# Patient Record
Sex: Male | Born: 1935 | Race: White | Hispanic: No | Marital: Married | Smoking: Former smoker
Health system: Southern US, Community
[De-identification: ages and names within clinical notes are randomized; demographics above are authoritative.]

## PROBLEM LIST (undated history)

## (undated) DIAGNOSIS — K219 Gastro-esophageal reflux disease without esophagitis: Secondary | ICD-10-CM

## (undated) DIAGNOSIS — N189 Chronic kidney disease, unspecified: Secondary | ICD-10-CM

## (undated) DIAGNOSIS — Z8673 Personal history of transient ischemic attack (TIA), and cerebral infarction without residual deficits: Secondary | ICD-10-CM

## (undated) DIAGNOSIS — I447 Left bundle-branch block, unspecified: Secondary | ICD-10-CM

## (undated) DIAGNOSIS — E785 Hyperlipidemia, unspecified: Secondary | ICD-10-CM

## (undated) DIAGNOSIS — I639 Cerebral infarction, unspecified: Secondary | ICD-10-CM

## (undated) DIAGNOSIS — Z8546 Personal history of malignant neoplasm of prostate: Secondary | ICD-10-CM

## (undated) DIAGNOSIS — R55 Syncope and collapse: Secondary | ICD-10-CM

## (undated) DIAGNOSIS — I2510 Atherosclerotic heart disease of native coronary artery without angina pectoris: Secondary | ICD-10-CM

## (undated) DIAGNOSIS — I10 Essential (primary) hypertension: Secondary | ICD-10-CM

## (undated) HISTORY — DX: Personal history of transient ischemic attack (TIA), and cerebral infarction without residual deficits: Z86.73

## (undated) HISTORY — DX: Left bundle-branch block, unspecified: I44.7

## (undated) HISTORY — DX: Cerebral infarction, unspecified: I63.9

## (undated) HISTORY — DX: Syncope and collapse: R55

## (undated) HISTORY — DX: Personal history of malignant neoplasm of prostate: Z85.46

## (undated) HISTORY — PX: TONSILLECTOMY: SUR1361

## (undated) HISTORY — DX: Chronic kidney disease, unspecified: N18.9

## (undated) HISTORY — DX: Gastro-esophageal reflux disease without esophagitis: K21.9

## (undated) HISTORY — DX: Hyperlipidemia, unspecified: E78.5

## (undated) HISTORY — DX: Atherosclerotic heart disease of native coronary artery without angina pectoris: I25.10

## (undated) HISTORY — DX: Essential (primary) hypertension: I10

---

## 2002-04-27 ENCOUNTER — Ambulatory Visit (HOSPITAL_COMMUNITY): Admission: RE | Admit: 2002-04-27 | Discharge: 2002-04-27 | Payer: Self-pay | Admitting: Gastroenterology

## 2003-04-11 ENCOUNTER — Ambulatory Visit: Admission: RE | Admit: 2003-04-11 | Discharge: 2003-06-10 | Payer: Self-pay | Admitting: Radiation Oncology

## 2003-10-04 ENCOUNTER — Emergency Department (HOSPITAL_COMMUNITY): Admission: EM | Admit: 2003-10-04 | Discharge: 2003-10-04 | Payer: Self-pay | Admitting: Emergency Medicine

## 2003-12-12 ENCOUNTER — Ambulatory Visit: Admission: RE | Admit: 2003-12-12 | Discharge: 2004-01-15 | Payer: Self-pay | Admitting: Radiation Oncology

## 2004-08-13 ENCOUNTER — Ambulatory Visit: Admission: RE | Admit: 2004-08-13 | Discharge: 2004-11-14 | Payer: Self-pay | Admitting: Radiation Oncology

## 2004-12-12 ENCOUNTER — Ambulatory Visit: Admission: RE | Admit: 2004-12-12 | Discharge: 2004-12-12 | Payer: Self-pay | Admitting: Radiation Oncology

## 2005-03-04 ENCOUNTER — Ambulatory Visit: Payer: Self-pay | Admitting: Family Medicine

## 2005-06-27 ENCOUNTER — Emergency Department: Payer: Self-pay | Admitting: Emergency Medicine

## 2005-06-27 ENCOUNTER — Other Ambulatory Visit: Payer: Self-pay

## 2006-05-13 ENCOUNTER — Ambulatory Visit: Payer: Self-pay | Admitting: Orthopedic Surgery

## 2006-08-28 ENCOUNTER — Ambulatory Visit (HOSPITAL_COMMUNITY): Admission: RE | Admit: 2006-08-28 | Discharge: 2006-08-28 | Payer: Self-pay | Admitting: *Deleted

## 2006-08-28 HISTORY — PX: CARDIAC CATHETERIZATION: SHX172

## 2007-04-29 ENCOUNTER — Ambulatory Visit (HOSPITAL_COMMUNITY): Admission: RE | Admit: 2007-04-29 | Discharge: 2007-04-29 | Payer: Self-pay | Admitting: *Deleted

## 2007-10-19 ENCOUNTER — Ambulatory Visit (HOSPITAL_COMMUNITY): Admission: RE | Admit: 2007-10-19 | Discharge: 2007-10-19 | Payer: Self-pay | Admitting: Family Medicine

## 2007-11-10 ENCOUNTER — Ambulatory Visit: Payer: Self-pay | Admitting: Family Medicine

## 2007-11-27 ENCOUNTER — Encounter: Admission: RE | Admit: 2007-11-27 | Discharge: 2007-11-27 | Payer: Self-pay | Admitting: Neurology

## 2008-05-24 ENCOUNTER — Encounter: Admission: RE | Admit: 2008-05-24 | Discharge: 2008-05-24 | Payer: Self-pay | Admitting: Neurology

## 2008-07-14 ENCOUNTER — Encounter: Admission: RE | Admit: 2008-07-14 | Discharge: 2008-07-14 | Payer: Self-pay | Admitting: Neurology

## 2008-08-15 ENCOUNTER — Ambulatory Visit: Payer: Self-pay | Admitting: Family Medicine

## 2008-08-22 ENCOUNTER — Encounter: Admission: RE | Admit: 2008-08-22 | Discharge: 2008-08-22 | Payer: Self-pay | Admitting: Gastroenterology

## 2008-09-07 ENCOUNTER — Ambulatory Visit: Payer: Self-pay | Admitting: Family Medicine

## 2008-10-10 ENCOUNTER — Ambulatory Visit: Payer: Self-pay | Admitting: Vascular Surgery

## 2008-10-10 ENCOUNTER — Encounter (INDEPENDENT_AMBULATORY_CARE_PROVIDER_SITE_OTHER): Payer: Self-pay | Admitting: Internal Medicine

## 2008-10-10 ENCOUNTER — Inpatient Hospital Stay (HOSPITAL_COMMUNITY): Admission: EM | Admit: 2008-10-10 | Discharge: 2008-10-13 | Payer: Self-pay | Admitting: Emergency Medicine

## 2008-10-10 HISTORY — PX: TRANSTHORACIC ECHOCARDIOGRAM: SHX275

## 2009-01-09 ENCOUNTER — Ambulatory Visit: Payer: Self-pay | Admitting: Family Medicine

## 2009-02-19 ENCOUNTER — Ambulatory Visit: Payer: Self-pay | Admitting: Unknown Physician Specialty

## 2009-03-11 ENCOUNTER — Ambulatory Visit: Payer: Self-pay | Admitting: Unknown Physician Specialty

## 2009-04-11 ENCOUNTER — Ambulatory Visit: Payer: Self-pay | Admitting: Unknown Physician Specialty

## 2009-04-26 ENCOUNTER — Ambulatory Visit: Payer: Self-pay | Admitting: Family Medicine

## 2010-03-26 ENCOUNTER — Ambulatory Visit: Payer: Self-pay | Admitting: Cardiology

## 2010-11-21 LAB — POCT CARDIAC MARKERS
CKMB, poc: <1 ng/mL — ABNORMAL LOW (ref 1.0–8.0)
Myoglobin, poc: 102 ng/mL (ref 12–200)
Myoglobin, poc: 73.4 ng/mL (ref 12–200)
Troponin i, poc: <0.05 ng/mL (ref 0.00–0.09)
Troponin i, poc: <0.05 ng/mL (ref 0.00–0.09)

## 2010-11-21 LAB — RAPID URINE DRUG SCREEN, HOSP PERFORMED
Amphetamines: NOT DETECTED
Barbiturates: NOT DETECTED
Opiates: NOT DETECTED
Tetrahydrocannabinol: NOT DETECTED

## 2010-11-21 LAB — COMPREHENSIVE METABOLIC PANEL
ALT: 107 U/L — ABNORMAL HIGH (ref 0–53)
ALT: 115 U/L — ABNORMAL HIGH (ref 0–53)
AST: 116 U/L — ABNORMAL HIGH (ref 0–37)
AST: 23 U/L (ref 0–37)
Alkaline Phosphatase: 49 U/L (ref 39–117)
Alkaline Phosphatase: 54 U/L (ref 39–117)
BUN: 24 mg/dL — ABNORMAL HIGH (ref 6–23)
CO2: 21 mEq/L (ref 19–32)
CO2: 26 mEq/L (ref 19–32)
Calcium: 7.4 mg/dL — ABNORMAL LOW (ref 8.4–10.5)
Calcium: 8.3 mg/dL — ABNORMAL LOW (ref 8.4–10.5)
Chloride: 111 mEq/L (ref 96–112)
Chloride: 114 mEq/L — ABNORMAL HIGH (ref 96–112)
GFR calc Af Amer: >60 mL/min (ref >60)
GFR calc non Af Amer: 54 mL/min — ABNORMAL LOW (ref >60)
Glucose, Bld: 78 mg/dL (ref 70–99)
Potassium: 4.1 mEq/L (ref 3.5–5.1)
Potassium: 4.2 mEq/L (ref 3.5–5.1)
Sodium: 137 mEq/L (ref 135–145)
Sodium: 138 mEq/L (ref 135–145)
Total Bilirubin: 0.7 mg/dL (ref 0.3–1.2)
Total Protein: 4.8 g/dL — ABNORMAL LOW (ref 6.0–8.3)
Total Protein: 4.9 g/dL — ABNORMAL LOW (ref 6.0–8.3)
Total Protein: 5.6 g/dL — ABNORMAL LOW (ref 6.0–8.3)

## 2010-11-21 LAB — CBC
Hemoglobin: 12.4 g/dL — ABNORMAL LOW (ref 13.0–17.0)
Hemoglobin: 12.8 g/dL — ABNORMAL LOW (ref 13.0–17.0)
Hemoglobin: 13.9 g/dL (ref 13.0–17.0)
MCHC: 34.5 g/dL (ref 30.0–36.0)
MCHC: 35 g/dL (ref 30.0–36.0)
MCHC: 35.1 g/dL (ref 30.0–36.0)
MCV: 97.9 fL (ref 78.0–100.0)
RBC: 3.57 MIL/uL — ABNORMAL LOW (ref 4.22–5.81)
RBC: 3.62 MIL/uL — ABNORMAL LOW (ref 4.22–5.81)
RBC: 3.74 MIL/uL — ABNORMAL LOW (ref 4.22–5.81)
RBC: 4.04 MIL/uL — ABNORMAL LOW (ref 4.22–5.81)
RDW: 12.9 % (ref 11.5–15.5)
RDW: 13.2 % (ref 11.5–15.5)
WBC: 5.1 10*3/uL (ref 4.0–10.5)
WBC: 9.8 10*3/uL (ref 4.0–10.5)

## 2010-11-21 LAB — URINALYSIS, ROUTINE W REFLEX MICROSCOPIC
Bilirubin Urine: NEGATIVE
Ketones, ur: NEGATIVE mg/dL
Leukocytes, UA: NEGATIVE
Protein, ur: NEGATIVE mg/dL

## 2010-11-21 LAB — HEMOGLOBIN A1C
Hgb A1c MFr Bld: 5.8 % (ref 4.6–6.1)
Mean Plasma Glucose: 120 mg/dL

## 2010-11-21 LAB — HEPATITIS PANEL, ACUTE
HCV Ab: NEGATIVE
Hep A IgM: NEGATIVE

## 2010-11-21 LAB — STOOL CULTURE

## 2010-11-21 LAB — GLUCOSE, CAPILLARY
Comment 1: 0
Glucose-Capillary: 128 mg/dL — ABNORMAL HIGH (ref 70–99)

## 2010-11-21 LAB — GIARDIA/CRYPTOSPORIDIUM SCREEN(EIA)
Cryptosporidium Screen (EIA): NEGATIVE
Giardia Screen - EIA: NEGATIVE

## 2010-11-21 LAB — CLOSTRIDIUM DIFFICILE EIA: C difficile Toxins A+B, EIA: NEGATIVE

## 2010-11-21 LAB — DIFFERENTIAL
Basophils Relative: 0 % (ref 0–1)
Monocytes Absolute: 0.3 10*3/uL (ref 0.1–1.0)
Monocytes Relative: 3 % (ref 3–12)

## 2010-11-21 LAB — HOMOCYSTEINE: Homocysteine: 7.4 umol/L (ref 4.0–15.4)

## 2010-11-21 LAB — POCT I-STAT, CHEM 8
Calcium, Ion: 1.13 mmol/L (ref 1.12–1.32)
Creatinine, Ser: 1.6 mg/dL — ABNORMAL HIGH (ref 0.4–1.5)

## 2010-11-21 LAB — CULTURE, BLOOD (ROUTINE X 2): Culture: NO GROWTH

## 2010-11-21 LAB — BASIC METABOLIC PANEL
CO2: 20 mEq/L (ref 19–32)
Chloride: 112 mEq/L (ref 96–112)
Creatinine, Ser: 1.16 mg/dL (ref 0.4–1.5)
GFR calc Af Amer: >60 mL/min (ref >60)

## 2010-11-21 LAB — CK TOTAL AND CKMB (NOT AT ARMC)
CK, MB: 0.9 ng/mL (ref 0.3–4.0)
Relative Index: INVALID (ref 0.0–2.5)

## 2010-11-21 LAB — FECAL LACTOFERRIN, QUANT: Fecal Lactoferrin: POSITIVE

## 2010-11-21 LAB — URINE MICROSCOPIC-ADD ON

## 2010-11-21 LAB — BRAIN NATRIURETIC PEPTIDE: Pro B Natriuretic peptide (BNP): 90 pg/mL (ref 0.0–100.0)

## 2010-11-21 LAB — TSH: TSH: 0.813 u[IU]/mL (ref 0.350–4.500)

## 2010-11-21 LAB — PROTIME-INR: INR: 1.1 (ref 0.00–1.49)

## 2010-12-24 NOTE — Discharge Summary (Signed)
NAME:  Keith Woods, Keith Woods NO.:  [id number]   MEDICAL RECORD NO.:  [id number]          PATIENT TYPE:  INP   LOCATION:  3038                         FACILITY:  [facility]   PHYSICIAN:  Peggye Pitt, M.D. DATE OF BIRTH:  Nov 27, 1925   DATE OF ADMISSION:  10/09/2008  DATE OF DISCHARGE:  10/13/2008                               DISCHARGE SUMMARY   DISCHARGE DIAGNOSES:  1. Syncopal event, believed to be secondary to stasis.  2. History of hypertension.  3. Transaminitis.  4. Diarrhea, resolved.  5. History of prostate cancer status post radiation therapy.   DISCHARGE MEDICATIONS:  1. Multivitamin 1 tablet daily.  2. Fish oil 1200 mg two times a day.  3. Nexium 40 mg daily.  4. Aggrenox 1 tablet twice daily.   He has been instructed to stop all of his blood pressure medications  until he is next seen by Dr. Reyes Ivan or by Dr. Sherrie Mustache.   DISPOSITION AND FOLLOW UP:  Keith Woods is discharged home in stable  condition.  Please note that he has had some transaminitis in the  hospital.  When he follows up with his primary care physician, he should  have a repeat CMET to monitor his liver function tests.  Please also  note that his blood pressure medications have been discontinued at this  time and the decision whether or not they should be restarted will be  undertaken by his primary care physician once he is seen outside of the  hospital.   CONSULTATIONS DURING THIS HOSPITALIZATION:  Rosine Abe, M.D. with  Cardiology.   IMAGES AND PROCEDURES PERFORMED DURING THIS HOSPITALIZATION:  1. A chest x-ray on October 10, 2008 that showed no active chest disease.  2. An abdominal ultrasound on October 12, 2008 that showed no hepatic      lesion, no acute or active process identified.  Normal appearance      of the gallbladder and bile ducts.  Bilateral renal cysts.   HISTORY AND PHYSICAL EXAMINATION:  For full details, please refer to  dictation by Dr. Cherylin Mylar on October 10, 2008,  but in brief Keith Woods  is a very pleasant 77 year old Caucasian man who presented with an  episode of syncope.  He has been having diarrhea for about 3 days prior  to this.  His wife was in the hospital.  He came to see his wife in the  hospital and while here experienced a syncopal event and was sent to the  emergency department because of this.   HOSPITAL COURSE:  1. Syncopal event.  He was found to be orthostatic upon admission, so      our belief at this time is that his syncope was secondary to the      effect of his blood pressure medications and the dehydration with      his acute viral gastroenteritis and diarrhea.  He was given      aggressive IV fluid repletion.  His diarrhea has subsided without      any use of antibiotics.  All of his blood pressure medications have  been held.  His blood pressure on day of discharge is 120/86      without any medications.  The need for further antihypertensive use      should be decided by his outpatient physician.  2. For his diarrhea, I believe to be secondary to acute viral      gastroenteritis given rapid resolution.  Completely resolved at the      time of discharge.  3. Transaminitis.  His liver function tests on day of discharge are as      follows:  Total bili 0.7, alk phos 54, AST 84, ALT 115.  Total      protein 4.8, albumin of 2.6.  He has had a hepatitis panel in the      hospital that is negative as well as a right upper quadrant      ultrasound that shows no evidence of liver or gallbladder disease.      At this time, I would recommend rechecking his CMET in 4-6 weeks to      monitor his liver function test.  4. Rest of chronic medical issues were not a problem this      hospitalization.   VITAL SIGNS ON DAY OF DISCHARGE:  Blood pressure 132/86, heart rate 93,  respirations 18, O2 sats 97% on room air with a temperature of 98.6.   LABORATORY DATA ON DAY OF DISCHARGE:  Sodium 136, potassium 4.1,  chloride 112, bicarb  20, BUN 11, creatinine 1.16 with a glucose of 80.  WBCs 4.5, hemoglobin 12.8 and a platelet count of 174.      Peggye Pitt, M.D.  Electronically Signed     EH/MEDQ  D:  10/13/2008  T:  10/14/2008  Job:  [id number]   cc:   Elmore Guise., M.D.  Mila Merry

## 2010-12-24 NOTE — H&P (Signed)
NAME:  Keith Woods, Keith Woods NO.:  [id number]   MEDICAL RECORD NO.:  [id number]          PATIENT TYPE:  INP   LOCATION:  2604                         FACILITY:  [facility]   PHYSICIAN:  Carlena Hurl, MDDATE OF BIRTH:  1925-12-25   DATE OF ADMISSION:  10/09/2008  DATE OF DISCHARGE:                              HISTORY & PHYSICAL   CHIEF COMPLAINT:  Passing out.   HISTORY OF PRESENT ILLNESS:  This is an 77 year old Caucasian gentleman  who has a past medical history significant for hypertension, history of  prostate cancer status post radiation therapy who came to the ER because  of 1 episode of syncope.  The history was given by the patient.  According to him, this patient was visiting his wife who was admitted  here.  Initially when the patient got up yesterday morning, had a  breakfast and after that he had some belly upset and had said that he  threw about 2 times and after that he was having some discomfort in the  belly, but he did come to the hospital to see his wife and here when he  came in to see his wife on the floor, he was still having some abdominal  discomfort.  He went to the restroom and after he had a bowel movement,  he came out and passed out.  The patient does not remember exactly as  how much time did he pass out, but he denied having any chest pain,  palpitation, trouble breathing, headache before the episode of passing  out.  The patient was totally normal after he woke up and he does not  remember anything about this episode.  The patient was having some  dizziness before the episode.  He states that his heart rate varies  sometimes, when he stands up he feels that his heart rate is going down.   PAST MEDICAL HISTORY:  Significant for hypertension and prostate cancer  status post radiation therapy.  The last one was 3 years ago and he has  some problem with his optic nerve.  The patient is not sure exactly what  type of optic nerve problem  for which he takes Aggrenox.   PAST SURGICAL HISTORY:  Nothing significant.   ALLERGIES:  No known drug allergies.   REVIEW OF SYSTEM:  Nothing significant.   FAMILY HISTORY:  Nothing significant.   The medications that the patient is on at home are Nexium, Aggrenox,  amlodipine, Micardis, Bystolic, fish oil, and multivitamins.   PHYSICAL EXAMINATION:  GENERAL:  This is an 77 year old, pleasant,  Caucasian gentleman who is lying comfortably on the bed without any  severe chest pain or severe shortness of breath.  VITAL SIGNS:  When he came into the ER, his blood pressure 108/63, later  dropped to 84/62 and the patient is positive for orthostats.  His lying  blood pressure was 125/98 with a heart rate of 89, on sitting his heart  rate went up to 92 and blood pressure dropped to 113/77 and on standing,  his blood pressure further dropped to 84/62 with heart rate going up  to  107.  HEENT:  Head is atraumatic, normocephalic.  Pupils, PERRLA.  Tympanic  membrane intact.  No discharge from eyes or ears.  LUNGS:  Clear to auscultation bilaterally.  CVS:  S1, S2 heard with regular rate and rhythm.  ABDOMEN:  Soft.  Bowel sounds present.  Nontender.  Nondistended.  EXTREMITIES:  No pedal edema noted.  Pulse palpable bilaterally and no  carotid bruit appreciated.  CNS:  Awake, alert, and oriented x3.  No focal neurological deficits  noted.   EKG showed bradycardia with nonspecific ST-T changes, but otherwise, no  significant blocks appreciated.   ASSESSMENT AND PLAN:  An 77 year old gentleman with a history of  hypertension and who has had 1 episode of passing out while in the  hospital.  1. Syncope.  At this time, the patient's orthostats is positive, so it      could be orthostatic hypotension causing the patient's syncopal      episode.  The patient has already been receiving IV fluids.  So      far, he has received 2 L of IV normal saline.  So, we will continue      giving him  normal saline and at the same time we will monitor his      heart rate.  The patient's heart rate has been varying and he has      been having PVCs on the monitor.  His last heart rate was around 39      with no sinus pauses.  So, we will continue to monitor the      patient's heart rate, if it further varies, we will get a      Cardiology involved to rule out any sick sinus syndrome.  2. History of hypertension.  Right now, the patient's blood pressures      have been varying and his orthostatics positive, so we will hold      his blood pressure medications for now and continue to monitor his      blood pressure.  3. History of prostate cancer status post radiation therapy,      clinically stable at this time.  4. Deep vein thrombosis prophylaxis, the patient will be placed on      Lovenox 40 mg subcu 1 time a day and gastrointestinal prophylaxis,      the patient will be placed on Protonix 40 mg p.o. 1 time a day.      Carlena Hurl, MD  Electronically Signed     JD/MEDQ  D:  10/10/2008  T:  10/10/2008  Job:  [id number]

## 2010-12-27 NOTE — Op Note (Signed)
[facility]  Patient:    Keith Woods, Keith Woods                       MRN: [id number] Proc. Date: 02/17/00 Attending:  Griffith Citron, M.D. CC:         Mila Merry, M.D.                           Operative Report  PROCEDURE PERFORMED:  Colonoscopy.  INDICATION:  77 year old white male who has not undergone prior colorectal neoplasia surveillance, presenting with straining, constipation and left lower quadrant abdominal pain.  PROCEDURE: After reviewing the nature of the procedure with the patient, including potential risks and complications, and after discussing alternative methods of diagnosis and treatment, informed consent was signed.  The patient was premedicated receiving IV sedation totalling Versed 6 mg, fentanyl 75 gm, anesthesia administered by the dosage prior to enduring the course of the procedure.  Using an Olympus pediatric PC-146 video colonoscope, the rectum was intubated after normal digital examination.  The prostate was normal, smooth and symmetric.  The scope was easily advanced around the entire length of the colon to the cecum which was identified by the appendiceal orifice and ileocecal valve. Preparation was excellent throughout, patient having taken Visicol prep.  The scope was totally withdrawn with careful inspection of the entire colon in a retrograde manner, including retroflex view of the rectal vault.  No abnormality was noted.  Specifically, there was no explanation for the patients constipation or straining, or left lower quadrant abdominal pain. No neoplasia, mucosal inflammation, diverticular disease or vascular lesion identified.  Retroflex view of the rectal vault did reveal internal hemorrhoids which did not appear to be inflamed.  The colon was decompressed, the scope withdrawn.  The patient tolerated the procedure without difficulty, being maintained on Datascope monitor and low-flow oxygen  throughout.  He returned to recovery in stable condition.  Time 1, technical 1, preparation 1, total score equals 3.  ASSESSMENT: 1.   Normal colonoscopy. 2.   Internal hemorrhoids. 3.   Functional constipation and/or left lower quadrant pain.  RECOMMENDATIONS: 1.   Continue Nulev p.r.n. 2.   Miralax, one tab full in a large glass of water nightly. 3.   Repeat colonoscopy 10 years or sooner for annual Hemeoccult-to be      followed by Dr. Mila Merry. DD:  02/17/00 TD:  02/17/00 Job: [id number]

## 2010-12-27 NOTE — Cardiovascular Report (Signed)
NAME:  Keith Woods, MUSSELL NO.:  [id number]   MEDICAL RECORD NO.:  [id number]          PATIENT TYPE:  OIB   LOCATION:  2899                         FACILITY:  [facility]   PHYSICIAN:  Elmore Guise., M.D.DATE OF BIRTH:  [birth year]   DATE OF PROCEDURE:  08/28/2006  DATE OF DISCHARGE:                            CARDIAC CATHETERIZATION   INDICATIONS FOR PROCEDURE:  Chest pain with abnormal stress test.   DESCRIPTION OF PROCEDURE:  The patient was brought to the cardiac cath  lab after appropriate informed consent.  He was prepped and draped in  sterile fashion.  Approximately 10 mL of 1% lidocaine was used for local  anesthesia.  A 6-French sheath was placed in the right femoral artery  without difficulty.  Coronary angiography, LV angiography and limited  right femoral angiography were then performed.  Angio-Seal closure  device was deployed without difficulty.  Approximately 60 cc of contrast  was used during procedure to limit contrast induced nephropathy.  The  patient was given bicarb drip as well as Mucomyst prior to the procedure  and his Micardis has been held for the last 48 hours.   FINDINGS:  1. Left Main:  Normal.  2. LAD:  Moderate-to-large sized vessel with mild luminal      irregularities.  3. D1:  Small to moderate size with mild luminal irregularities.  4. Left circumflex:  Codominant with mild luminal irregularities.  5. OM-1/OM-2:  Moderate-sized vessel with mild luminal irregularities.  6. Ramus intermedius:  Moderate sized with mild luminal      irregularities.  7. RCA:  Small to moderate size, codominant with mild luminal      irregularities.  8. LV:  EF of 60%.  No wall motion abnormalities.  LVEDP is 6 mmHg.  9. Limited right femoral angiogram was performed showing no      significant disease and appropriate arteriotomy site for Angio-Seal      closure device deployment.  AngioSeal closure device was deployed      without difficulty.   IMPRESSION:  1. Nonobstructive coronary arteries.  2. Preserved LV systolic function with an EF of 60%.   PLAN:  1. At this time, I would recommend aggressive risk factor modification      as indicated.  Because of his baseline renal insufficiency      (creatinine 1.8), the patient is to hold his Micardis for another      48 hours and then restart at      his prior dose.  We will finish off his bicarb drip today and he      will finish his treatment with Mucomyst.  He is to call should he      have any further problems.  Routine post cath procedure      restrictions are to be done.  He will follow up in the office in 1      week.      Elmore Guise., M.D.  Electronically Signed     TWK/MEDQ  D:  08/28/2006  T:  08/28/2006  Job:  [id number]   cc:  Mila Merry

## 2011-04-08 ENCOUNTER — Encounter: Payer: Self-pay | Admitting: Cardiology

## 2011-04-23 ENCOUNTER — Ambulatory Visit (INDEPENDENT_AMBULATORY_CARE_PROVIDER_SITE_OTHER): Payer: Medicare Other | Admitting: Cardiology

## 2011-04-23 ENCOUNTER — Encounter: Payer: Self-pay | Admitting: Cardiology

## 2011-04-23 VITALS — BP 112/60 | HR 61 | Ht 74.0 in | Wt 180.2 lb

## 2011-04-23 DIAGNOSIS — I10 Essential (primary) hypertension: Secondary | ICD-10-CM

## 2011-04-23 DIAGNOSIS — I2510 Atherosclerotic heart disease of native coronary artery without angina pectoris: Secondary | ICD-10-CM

## 2011-04-23 DIAGNOSIS — R0989 Other specified symptoms and signs involving the circulatory and respiratory systems: Secondary | ICD-10-CM | POA: Insufficient documentation

## 2011-04-23 DIAGNOSIS — E785 Hyperlipidemia, unspecified: Secondary | ICD-10-CM

## 2011-04-23 MED ORDER — NITROGLYCERIN 0.4 MG SL SUBL: 0.4000 mg | SUBLINGUAL_TABLET | SUBLINGUAL | Status: DC | PRN

## 2011-04-23 NOTE — Assessment & Plan Note (Signed)
Surprisingly good response to WelChol. He is doing much better with his diet as well.

## 2011-04-23 NOTE — Progress Notes (Signed)
Verbon Frye Date of Birth: 1925-08-26   History of Present Illness: Mr. Sortor is seen for yearly followup. He has done very well from a cardiac standpoint. He denies any chest pain or shortness of breath. He has had no edema or palpitations. He was started on WelChol for his hypercholesterolemia and noted a 100 point drop in his cholesterol level. He has a history of statin intolerance. 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  Current Outpatient Prescriptions on File Prior to Visit  Medication Sig Dispense Refill  . amLODipine (NORVASC) 2.5 MG tablet Take 2.5 mg by mouth as needed.        . citalopram (CELEXA) 10 MG tablet Take 5 mg by mouth 2 (two) times daily.        . colesevelam (WELCHOL) 625 MG tablet Take 1,875 mg by mouth daily. 3 pills in the morning and 3 pills at night       . dipyridamole-aspirin (AGGRENOX) 25-200 MG per 12 hr capsule Take 1 capsule by mouth 2 (two) times daily.        Marland Kitchen esomeprazole (NEXIUM) 40 MG capsule Take 40 mg by mouth every other day.       . fish oil-omega-3 fatty acids 1000 MG capsule Take 1,200 mg by mouth 2 (two) times daily.        . Multiple Vitamin (MULTIVITAMIN) tablet Take 1 tablet by mouth daily.          Allergies  Allergen Reactions  . Lipitor (Atorvastatin Calcium)   . Nsaids   . Statins     Past Medical History  Diagnosis Date  . Syncope and collapse   . Hypertension   . Coronary artery disease     NONOBSTRUCTIVE  . History of transient ischemic attack (TIA)   . GERD (gastroesophageal reflux disease)   . History of prostate cancer     Past Surgical History  Procedure Date  . Cardiac catheterization 08/28/2006    EF 60%  . Tonsillectomy   . Transthoracic echocardiogram 10/10/2008    EF 55%    History  Smoking status  . Never Smoker   Smokeless tobacco  . Not on file    History  Alcohol Use No    Family History  Problem Relation Age of Onset  . Ovarian cancer Mother   . Prostate cancer  Father     Review of Systems: The review of systems is positive for a persistent cough. He just completed a course of antibiotics and feels that it is better. He has transient lightheadedness when he stands. He does note a little more balance problems which she attributes to age. All other systems were reviewed and are negative.  Physical Exam: BP 112/60  Pulse 61  Ht 6\' 2"  (1.88 m)  Wt 180 lb 3.2 oz (81.738 kg)  BMI 23.14 kg/m2 He is an elderly white male in no acute distress. He appears younger than his stated age.The patient is alert and oriented x 3.  The mood and affect are normal.  The skin is warm and dry.  Color is normal.  The HEENT exam reveals that the sclera are nonicteric.  The mucous membranes are moist.  The carotids are 2+ without bruits.  There is no thyromegaly.  There is no JVD.  The lungs are clear.  The chest wall is non tender.  The heart exam reveals a regular rate with a normal S1 and S2.  There are no murmurs, gallops, or  rubs.  The PMI is not displaced.   Abdominal exam reveals good bowel sounds.  There is no guarding or rebound.  There is no hepatosplenomegaly or tenderness.  There are no masses.  Exam of the legs reveal no clubbing, cyanosis, or edema.  The legs are without rashes.  The distal pulses are intact.  Cranial nerves II - XII are intact.  Motor and sensory functions are intact.  The gait is normal.  LABORATORY DATA:   Assessment / Plan:

## 2011-04-23 NOTE — Assessment & Plan Note (Signed)
Blood pressure is under excellent control. 

## 2011-04-23 NOTE — Assessment & Plan Note (Signed)
He had nonobstructive coronary disease by cardiac catheterization in 2008. His ECG today is unchanged from one year ago showing left axis deviation and old anterior infarction. He remains asymptomatic. We renewed his prescription for nitroglycerin. Otherwise we will followup again in one year.

## 2011-04-23 NOTE — Patient Instructions (Signed)
Continue with your current therapy.  I will see you in 1 year.

## 2011-05-05 LAB — CREATININE, SERUM
Creatinine, Ser: 1.56 — ABNORMAL HIGH
GFR calc Af Amer: 52 — ABNORMAL LOW
GFR calc non Af Amer: 43 — ABNORMAL LOW

## 2011-06-11 ENCOUNTER — Ambulatory Visit: Payer: Medicare Other | Admitting: Urology

## 2011-12-01 DIAGNOSIS — C61 Malignant neoplasm of prostate: Secondary | ICD-10-CM | POA: Diagnosis not present

## 2011-12-25 DIAGNOSIS — Z23 Encounter for immunization: Secondary | ICD-10-CM | POA: Diagnosis not present

## 2011-12-25 DIAGNOSIS — I10 Essential (primary) hypertension: Secondary | ICD-10-CM | POA: Diagnosis not present

## 2011-12-25 DIAGNOSIS — R079 Chest pain, unspecified: Secondary | ICD-10-CM | POA: Diagnosis not present

## 2012-04-28 ENCOUNTER — Encounter: Payer: Self-pay | Admitting: Cardiology

## 2012-04-28 ENCOUNTER — Ambulatory Visit (INDEPENDENT_AMBULATORY_CARE_PROVIDER_SITE_OTHER): Payer: Medicare Other | Admitting: Cardiology

## 2012-04-28 VITALS — BP 128/74 | HR 60 | Ht 74.0 in | Wt 182.0 lb

## 2012-04-28 DIAGNOSIS — I10 Essential (primary) hypertension: Secondary | ICD-10-CM | POA: Diagnosis not present

## 2012-04-28 DIAGNOSIS — I447 Left bundle-branch block, unspecified: Secondary | ICD-10-CM

## 2012-04-28 DIAGNOSIS — I2510 Atherosclerotic heart disease of native coronary artery without angina pectoris: Secondary | ICD-10-CM | POA: Diagnosis not present

## 2012-04-28 DIAGNOSIS — E785 Hyperlipidemia, unspecified: Secondary | ICD-10-CM

## 2012-04-28 NOTE — Patient Instructions (Signed)
Continue your current therapy  You may want to consider changing to the powder form of Welchol.  I will see you again in one year.

## 2012-04-28 NOTE — Progress Notes (Signed)
Keith Woods Date of Birth: 05-31-1926   History of Present Illness: Keith Woods is seen for yearly followup. He continues to do very well from a cardiac standpoint. The only episode of chest pain he has had is about 2 months ago. He reports it is an ECG done blood tests that were negative. He had increased his Nexium from every other day to daily and his pain went away. He has had no recurrent episodes. This is felt to be more GI related. He denies any shortness of breath. He remains very active.  Current Outpatient Prescriptions on File Prior to Visit  Medication Sig Dispense Refill  . amLODipine (NORVASC) 2.5 MG tablet Take 2.5 mg by mouth daily.       . citalopram (CELEXA) 10 MG tablet Take 5 mg by mouth 2 (two) times daily.        . colesevelam (WELCHOL) 625 MG tablet Take 1,875 mg by mouth daily. 3 pills in the morning and 3 pills at night       . dipyridamole-aspirin (AGGRENOX) 25-200 MG per 12 hr capsule Take 1 capsule by mouth 2 (two) times daily.        Marland Kitchen esomeprazole (NEXIUM) 40 MG capsule Take 40 mg by mouth daily.       . fish oil-omega-3 fatty acids 1000 MG capsule Take 1,200 mg by mouth daily.       . Multiple Vitamin (MULTIVITAMIN) tablet Take 1 tablet by mouth daily.        . nitroGLYCERIN (NITROSTAT) 0.4 MG SL tablet Place 1 tablet (0.4 mg total) under the tongue every 5 (five) minutes as needed for chest pain.  100 tablet  3    Allergies  Allergen Reactions  . Lipitor (Atorvastatin Calcium)   . Nsaids   . Statins     Past Medical History  Diagnosis Date  . Syncope and collapse   . Hypertension   . Coronary artery disease     NONOBSTRUCTIVE by cath 2008  . History of transient ischemic attack (TIA)   . GERD (gastroesophageal reflux disease)   . History of prostate cancer   . Hyperlipidemia   . LBBB (left bundle branch block)     Past Surgical History  Procedure Date  . Cardiac catheterization 08/28/2006    EF 60%  . Tonsillectomy   . Transthoracic  echocardiogram 10/10/2008    EF 55%    History  Smoking status  . Never Smoker   Smokeless tobacco  . Not on file    History  Alcohol Use No    Family History  Problem Relation Age of Onset  . Ovarian cancer Mother   . Prostate cancer Father     Review of Systems: The review of systems is positive for  transient lightheadedness when he stands.  All other systems were reviewed and are negative.  Physical Exam: BP 128/74  Pulse 60  Ht 6\' 2"  (1.88 m)  Wt 182 lb (82.555 kg)  BMI 23.37 kg/m2 He is an elderly white male in no acute distress. He appears younger than his stated age.The patient is alert and oriented x 3.  The mood and affect are normal.  The skin is warm and dry.  Color is normal.  The HEENT exam reveals that the sclera are nonicteric.  The mucous membranes are moist.  The carotids are 2+ without bruits.  There is no thyromegaly.  There is no JVD.  The lungs are clear.  The chest wall is  non tender.  The heart exam reveals a regular rate with a normal S1 and S2.  There are no murmurs, gallops, or rubs.  The PMI is not displaced.   Abdominal exam reveals good bowel sounds.  There is no guarding or rebound.  There is no hepatosplenomegaly or tenderness.  There are no masses.  Exam of the legs reveal no clubbing, cyanosis, or edema.  The legs are without rashes.  The distal pulses are intact.  Cranial nerves II - XII are intact.  Motor and sensory functions are intact.  The gait is normal.  LABORATORY DATA: ECG today demonstrates normal sinus rhythm with left axis deviation and a left bundle branch block. Assessment / Plan: 1. Coronary disease, nonobstructive. Continue risk factor modification.  2. Hypertension, well controlled.  3. Hyperlipidemia. Intolerant of multiple medications but he is tolerating WelChol well. He doesn't like taking 6 pills so I told him to consider taking the powder formulation.

## 2012-05-15 DIAGNOSIS — Z23 Encounter for immunization: Secondary | ICD-10-CM | POA: Diagnosis not present

## 2012-05-15 DIAGNOSIS — R079 Chest pain, unspecified: Secondary | ICD-10-CM | POA: Diagnosis not present

## 2012-05-15 DIAGNOSIS — I10 Essential (primary) hypertension: Secondary | ICD-10-CM | POA: Diagnosis not present

## 2012-06-02 DIAGNOSIS — C61 Malignant neoplasm of prostate: Secondary | ICD-10-CM | POA: Diagnosis not present

## 2012-10-11 ENCOUNTER — Ambulatory Visit: Payer: Self-pay | Admitting: Family Medicine

## 2012-10-11 DIAGNOSIS — Z23 Encounter for immunization: Secondary | ICD-10-CM | POA: Diagnosis not present

## 2012-10-11 DIAGNOSIS — G608 Other hereditary and idiopathic neuropathies: Secondary | ICD-10-CM | POA: Diagnosis not present

## 2012-10-11 DIAGNOSIS — G589 Mononeuropathy, unspecified: Secondary | ICD-10-CM | POA: Diagnosis not present

## 2012-10-11 DIAGNOSIS — R059 Cough, unspecified: Secondary | ICD-10-CM | POA: Diagnosis not present

## 2012-10-11 DIAGNOSIS — R05 Cough: Secondary | ICD-10-CM | POA: Diagnosis not present

## 2012-12-06 DIAGNOSIS — C61 Malignant neoplasm of prostate: Secondary | ICD-10-CM | POA: Diagnosis not present

## 2012-12-06 DIAGNOSIS — Z8546 Personal history of malignant neoplasm of prostate: Secondary | ICD-10-CM | POA: Insufficient documentation

## 2012-12-22 ENCOUNTER — Encounter: Payer: Self-pay | Admitting: Nurse Practitioner

## 2012-12-22 DIAGNOSIS — R42 Dizziness and giddiness: Secondary | ICD-10-CM | POA: Diagnosis not present

## 2012-12-22 DIAGNOSIS — G608 Other hereditary and idiopathic neuropathies: Secondary | ICD-10-CM | POA: Diagnosis not present

## 2012-12-22 DIAGNOSIS — Z23 Encounter for immunization: Secondary | ICD-10-CM | POA: Diagnosis not present

## 2012-12-22 DIAGNOSIS — E559 Vitamin D deficiency, unspecified: Secondary | ICD-10-CM | POA: Diagnosis not present

## 2012-12-30 DIAGNOSIS — Z23 Encounter for immunization: Secondary | ICD-10-CM | POA: Diagnosis not present

## 2012-12-30 DIAGNOSIS — G608 Other hereditary and idiopathic neuropathies: Secondary | ICD-10-CM | POA: Diagnosis not present

## 2012-12-30 DIAGNOSIS — R42 Dizziness and giddiness: Secondary | ICD-10-CM | POA: Diagnosis not present

## 2013-01-04 ENCOUNTER — Encounter: Payer: Self-pay | Admitting: Nurse Practitioner

## 2013-01-04 ENCOUNTER — Ambulatory Visit (INDEPENDENT_AMBULATORY_CARE_PROVIDER_SITE_OTHER): Payer: Medicare Other | Admitting: Nurse Practitioner

## 2013-01-04 VITALS — BP 130/78 | HR 56 | Ht 74.0 in | Wt 184.2 lb

## 2013-01-04 DIAGNOSIS — I498 Other specified cardiac arrhythmias: Secondary | ICD-10-CM

## 2013-01-04 DIAGNOSIS — R42 Dizziness and giddiness: Secondary | ICD-10-CM | POA: Diagnosis not present

## 2013-01-04 DIAGNOSIS — R001 Bradycardia, unspecified: Secondary | ICD-10-CM

## 2013-01-04 NOTE — Patient Instructions (Addendum)
Stay on your current medicines but stop the amlodipine  Monitor your blood pressure at home  See Dr. [place] in one month  Call the [facility] office at [phone] if you have any questions, problems or concerns.

## 2013-01-04 NOTE — Progress Notes (Signed)
Keith Woods Date of Birth: 10-25-1925 Medical Record #[id number]  History of Present Illness: [name] is seen back today for a work in visit. Seen for Dr. [place]. Has nonobstructive CAD per cath back in 2008, HLD, GERD, HTN and prostate cancer.   He has had some dizziness earlier this month. Seemed to be with going from a seated to standing position. Had checked his pulse and noted HR down to 30. Saw his PCP.  Had a 24 hour Holter with his PCP. Shows minimum rate of 46, maximum HR 90, average HR was 61; no pauses. Had PVC's noted - 2044 but no runs or significant arrhythmia noted. He has a chronic LBBB.   Comes in today. He is here with his wife. He notes that he will have some dizzy spells - 4 to 6 per day. Feels like inside his head is spinning. No real palpitations. Not passing out. In talking with him, it seems most of his spells are with position changes (prone to standing). Not with moving his head. Does not check his BP at home. He is not having chest pain. Not short of breath. Tries to restrict his salt. He did have labs checked recently by his PCP.  Current Outpatient Prescriptions on File Prior to Visit  Medication Sig Dispense Refill  . amLODipine (NORVASC) 2.5 MG tablet Take 2.5 mg by mouth daily.       . citalopram (CELEXA) 10 MG tablet Take 5 mg by mouth 2 (two) times daily.        . colesevelam (WELCHOL) 625 MG tablet Take 1,875 mg by mouth daily. 3 pills in the morning and 3 pills at night       . dipyridamole-aspirin (AGGRENOX) 25-200 MG per 12 hr capsule Take 1 capsule by mouth 2 (two) times daily.        Marland Kitchen esomeprazole (NEXIUM) 40 MG capsule Take 40 mg by mouth daily.       . fish oil-omega-3 fatty acids 1000 MG capsule Take 1,200 mg by mouth daily.       . Multiple Vitamin (MULTIVITAMIN) tablet Take 1 tablet by mouth daily.        . nitroGLYCERIN (NITROSTAT) 0.4 MG SL tablet Place 1 tablet (0.4 mg total) under the tongue every 5 (five) minutes as needed for chest pain.   100 tablet  3   No current facility-administered medications on file prior to visit.    Allergies  Allergen Reactions  . Lipitor (Atorvastatin Calcium)   . Nsaids   . Statins     Past Medical History  Diagnosis Date  . Syncope and collapse   . Hypertension   . Coronary artery disease     NONOBSTRUCTIVE by cath 2008  . History of transient ischemic attack (TIA)   . GERD (gastroesophageal reflux disease)   . History of prostate cancer   . Hyperlipidemia   . LBBB (left bundle branch block)     Past Surgical History  Procedure Laterality Date  . Cardiac catheterization  08/28/2006    EF 60%  . Tonsillectomy    . Transthoracic echocardiogram  10/10/2008    EF 55%    History  Smoking status  . Never Smoker   Smokeless tobacco  . Not on file    History  Alcohol Use No    Family History  Problem Relation Age of Onset  . Ovarian cancer Mother   . Prostate cancer Father     Review of Systems: The review  of systems is per the HPI.  All other systems were reviewed and are negative.  Physical Exam: BP 130/78  Pulse 56  Ht 6\' 2"  (1.88 m)  Wt 184 lb 3.2 oz (83.553 kg)  BMI 23.64 kg/m2 Sitting BP is 140/80 and standing is 110/70.  Patient is very pleasant and in no acute distress. Looks younger than his stated age. Skin is warm and dry. Color is normal.  HEENT is unremarkable. Normocephalic/atraumatic. PERRL. Sclera are nonicteric. Neck is supple. No masses. No JVD. Lungs are clear. Cardiac exam shows a regular rate and rhythm. Abdomen is soft. Extremities are without edema. Gait and ROM are intact. No gross neurologic deficits noted.  LABORATORY DATA: Holter is reviewed with Dr. [place].     Chemistry      Component Value Date/Time   NA 136 10/13/2008 0540   K 4.1 HEMOLYZED SPECIMEN, RESULTS MAY BE AFFECTED 10/13/2008 0540   CL 112 10/13/2008 0540   CO2 20 10/13/2008 0540   BUN 11 10/13/2008 0540   CREATININE 1.16 10/13/2008 0540      Component Value Date/Time   CALCIUM  8.3* 10/13/2008 0540   ALKPHOS 54 10/12/2008 0530   AST 84* 10/12/2008 0530   ALT 115* 10/12/2008 0530   BILITOT 0.7 10/12/2008 0530     Lab Results  Component Value Date   WBC 4.5 10/13/2008   HGB 12.8* 10/13/2008   HCT 36.6* 10/13/2008   MCV 97.9 10/13/2008   PLT 174 10/13/2008   No results found for this basename: CHOL,  HDL,  LDLCALC,  LDLDIRECT,  TRIG,  CHOLHDL    Assessment / Plan: 1. Dizziness - sounds more orthostatic to me. He is orthostatic here in the office today. I have stopped his Norvasc. I think this should be our first line of management. May need to liberalize salt as well.   2. Chronic LBBB  3. Resting bradycardia - not on any rate slowing medicines. No pauses noted. Lowest rate of 46 noted on the Holter.   4. PVC's - would not be able to start beta blocker in light of his bradycardia.   We will try this over the next month. See him back in a month.   Patient is agreeable to this plan and will call if any problems develop in the interim.   Keith Macadamia, RN, ANP-C [facility] [address] [place], [place]  [address]

## 2013-01-10 ENCOUNTER — Telehealth: Payer: Self-pay

## 2013-01-10 NOTE — Telephone Encounter (Signed)
Received message from Brien Mates CMA patient needs appointment with Dr.Jordan in 1 month.Patient called appointment scheduled with Dr.Jordan 02/09/13 at 11:45 am.

## 2013-02-09 ENCOUNTER — Ambulatory Visit (INDEPENDENT_AMBULATORY_CARE_PROVIDER_SITE_OTHER): Payer: Medicare Other | Admitting: Cardiology

## 2013-02-09 ENCOUNTER — Encounter: Payer: Self-pay | Admitting: Cardiology

## 2013-02-09 VITALS — BP 144/80 | HR 59 | Ht 74.0 in | Wt 186.0 lb

## 2013-02-09 DIAGNOSIS — I10 Essential (primary) hypertension: Secondary | ICD-10-CM | POA: Diagnosis not present

## 2013-02-09 DIAGNOSIS — I2510 Atherosclerotic heart disease of native coronary artery without angina pectoris: Secondary | ICD-10-CM

## 2013-02-09 DIAGNOSIS — I447 Left bundle-branch block, unspecified: Secondary | ICD-10-CM | POA: Diagnosis not present

## 2013-02-09 DIAGNOSIS — E785 Hyperlipidemia, unspecified: Secondary | ICD-10-CM | POA: Diagnosis not present

## 2013-02-09 NOTE — Progress Notes (Signed)
Keith Woods Date of Birth: 1926-06-04 Medical Record #[id number]  History of Present Illness: Mr. Bostwick is seen for a followup visit. Has nonobstructive CAD per cath back in 2008, HLD, GERD, HTN and prostate cancer. When seen in May he was experiencing symptoms of dizziness. Ultram monitor showed some sinus bradycardia but no significant pauses. He had occasional PVCs. His symptoms were orthostatic in nature. His amlodipine was discontinued. Since then his dizziness has resolved. He does complain of a lack of energy. His blood pressure readings at home have been variable. Frequently has normal blood pressure readings of 120/72 but may get a reading as high as 173 systolic at times.   Current Outpatient Prescriptions on File Prior to Visit  Medication Sig Dispense Refill  . citalopram (CELEXA) 10 MG tablet Take 5 mg by mouth 2 (two) times daily.        . colesevelam (WELCHOL) 625 MG tablet Take 1,875 mg by mouth daily. 3 pills in the morning and 3 pills at night       . dipyridamole-aspirin (AGGRENOX) 25-200 MG per 12 hr capsule Take 1 capsule by mouth 2 (two) times daily.        Marland Kitchen esomeprazole (NEXIUM) 40 MG capsule Take 40 mg by mouth daily.       . fish oil-omega-3 fatty acids 1000 MG capsule Take 1,200 mg by mouth daily.       . Multiple Vitamin (MULTIVITAMIN) tablet Take 1 tablet by mouth daily.        . nitroGLYCERIN (NITROSTAT) 0.4 MG SL tablet Place 1 tablet (0.4 mg total) under the tongue every 5 (five) minutes as needed for chest pain.  100 tablet  3  . Vitamin D, Ergocalciferol, (DRISDOL) 50000 UNITS CAPS Take 50,000 Units by mouth every 7 (seven) days.        No current facility-administered medications on file prior to visit.    Allergies  Allergen Reactions  . Lipitor (Atorvastatin Calcium)   . Nsaids   . Statins     Past Medical History  Diagnosis Date  . Syncope and collapse   . Hypertension   . Coronary artery disease     NONOBSTRUCTIVE by cath 2008  . History  of transient ischemic attack (TIA)   . GERD (gastroesophageal reflux disease)   . History of prostate cancer   . Hyperlipidemia   . LBBB (left bundle branch block)     Past Surgical History  Procedure Laterality Date  . Cardiac catheterization  08/28/2006    EF 60%  . Tonsillectomy    . Transthoracic echocardiogram  10/10/2008    EF 55%    History  Smoking status  . Never Smoker   Smokeless tobacco  . Not on file    History  Alcohol Use No    Family History  Problem Relation Age of Onset  . Ovarian cancer Mother   . Prostate cancer Father     Review of Systems: The review of systems is per the HPI.  All other systems were reviewed and are negative.  Physical Exam: BP 144/80  Pulse 59  Ht 6\' 2"  (1.88 m)  Wt 186 lb (84.369 kg)  BMI 23.87 kg/m2  SpO2 95% Patient is very pleasant and in no acute distress. Looks younger than his stated age. Skin is warm and dry. Color is normal.  HEENT is unremarkable. Normocephalic/atraumatic. PERRL. Sclera are nonicteric. Neck is supple. No masses. No JVD. Lungs are clear. Cardiac exam shows a regular rate  and rhythm. Abdomen is soft. Extremities are without edema. Gait and ROM are intact. No gross neurologic deficits noted.  LABORATORY DATA:    Chemistry      Component Value Date/Time   NA 136 10/13/2008 0540   K 4.1 HEMOLYZED SPECIMEN, RESULTS MAY BE AFFECTED 10/13/2008 0540   CL 112 10/13/2008 0540   CO2 20 10/13/2008 0540   BUN 11 10/13/2008 0540   CREATININE 1.16 10/13/2008 0540      Component Value Date/Time   CALCIUM 8.3* 10/13/2008 0540   ALKPHOS 54 10/12/2008 0530   AST 84* 10/12/2008 0530   ALT 115* 10/12/2008 0530   BILITOT 0.7 10/12/2008 0530     Lab Results  Component Value Date   WBC 4.5 10/13/2008   HGB 12.8* 10/13/2008   HCT 36.6* 10/13/2008   MCV 97.9 10/13/2008   PLT 174 10/13/2008   No results found for this basename: CHOL,  HDL,  LDLCALC,  LDLDIRECT,  TRIG,  CHOLHDL    Assessment / Plan: 1. Dizziness - by history this was  orthostatic in nature. His symptoms have resolved with cessation of amlodipine. He is currently on no antihypertensive therapy and I think his blood pressure readings are acceptable. I think that being overly aggressive in treating his elevated blood pressure will lead to intolerable side effects. We will continue to monitor for now.  2. Chronic LBBB  3. Resting bradycardia - not on any rate slowing medicines. No pauses noted. Lowest rate of 46 noted on the Holter.   4. PVC's asymptomatic

## 2013-02-09 NOTE — Patient Instructions (Signed)
Continue your current therapy  I will see you in 6 months.   

## 2013-03-10 ENCOUNTER — Ambulatory Visit: Payer: Medicare Other | Admitting: Cardiology

## 2013-04-26 DIAGNOSIS — R05 Cough: Secondary | ICD-10-CM | POA: Diagnosis not present

## 2013-04-26 DIAGNOSIS — Z23 Encounter for immunization: Secondary | ICD-10-CM | POA: Diagnosis not present

## 2013-04-26 DIAGNOSIS — G608 Other hereditary and idiopathic neuropathies: Secondary | ICD-10-CM | POA: Diagnosis not present

## 2013-04-28 ENCOUNTER — Ambulatory Visit: Payer: Self-pay | Admitting: Family Medicine

## 2013-04-28 DIAGNOSIS — R918 Other nonspecific abnormal finding of lung field: Secondary | ICD-10-CM | POA: Diagnosis not present

## 2013-04-28 DIAGNOSIS — R05 Cough: Secondary | ICD-10-CM | POA: Diagnosis not present

## 2013-04-28 DIAGNOSIS — R059 Cough, unspecified: Secondary | ICD-10-CM | POA: Diagnosis not present

## 2013-04-28 DIAGNOSIS — N289 Disorder of kidney and ureter, unspecified: Secondary | ICD-10-CM | POA: Diagnosis not present

## 2013-05-04 DIAGNOSIS — Z23 Encounter for immunization: Secondary | ICD-10-CM | POA: Diagnosis not present

## 2013-05-04 DIAGNOSIS — J984 Other disorders of lung: Secondary | ICD-10-CM | POA: Diagnosis not present

## 2013-05-04 DIAGNOSIS — G608 Other hereditary and idiopathic neuropathies: Secondary | ICD-10-CM | POA: Diagnosis not present

## 2013-05-06 DIAGNOSIS — H903 Sensorineural hearing loss, bilateral: Secondary | ICD-10-CM | POA: Diagnosis not present

## 2013-05-06 DIAGNOSIS — H6120 Impacted cerumen, unspecified ear: Secondary | ICD-10-CM | POA: Diagnosis not present

## 2013-07-04 DIAGNOSIS — G608 Other hereditary and idiopathic neuropathies: Secondary | ICD-10-CM | POA: Diagnosis not present

## 2013-07-04 DIAGNOSIS — S43499A Other sprain of unspecified shoulder joint, initial encounter: Secondary | ICD-10-CM | POA: Diagnosis not present

## 2013-07-04 DIAGNOSIS — Z23 Encounter for immunization: Secondary | ICD-10-CM | POA: Diagnosis not present

## 2013-07-20 DIAGNOSIS — H2510 Age-related nuclear cataract, unspecified eye: Secondary | ICD-10-CM | POA: Diagnosis not present

## 2013-08-15 ENCOUNTER — Ambulatory Visit (INDEPENDENT_AMBULATORY_CARE_PROVIDER_SITE_OTHER): Payer: Medicare Other | Admitting: Nurse Practitioner

## 2013-08-15 ENCOUNTER — Encounter: Payer: Self-pay | Admitting: Nurse Practitioner

## 2013-08-15 VITALS — BP 110/70 | HR 76 | Ht 74.0 in | Wt 183.4 lb

## 2013-08-15 DIAGNOSIS — I10 Essential (primary) hypertension: Secondary | ICD-10-CM | POA: Diagnosis not present

## 2013-08-15 NOTE — Patient Instructions (Signed)
Monitor your blood pressure at home - try to get your cuff checked with your PCP  See [name] in 6 months  Stay as active as you can  Call the [place] office at [phone] if you have any questions, problems or concerns.

## 2013-08-15 NOTE — Progress Notes (Signed)
Keith Woods Date of Birth: Jul 08, 1926 Medical Record #[id number]  History of Present Illness: Keith Woods is seen back today for a 6 month check. Seen for Keith Woods. He has nonobstructive CAD per cath back in 2008, HLD, GERD, HTN and prostatae cancer. He has had past issues with dizziness and has had bradycardia noted on Holter with PVCs - symptoms seemed to be orthostatic and his Norvasc was stopped. Past TIA and he is on chronic Aggrenox.   Last seen in July - seemed to be doing ok.   Comes back today. Here alone. Doing well. Has used his Norvasc on 3 separate occasions since his last visit here due to elevated BP - over 180. He has continued to have some dizziness - sounds positional - with turning over in the bed and going from lying to standing position. No chest pain. Not as active in the winter. More limited by back pain. Labs by primary care in the last 6 months. Tries to limit his salt. BP cuff quite old - has never changed the batteries - never checked in the office.   Current Outpatient Prescriptions  Medication Sig Dispense Refill  . amLODipine (NORVASC) 2.5 MG tablet Take 2.5 mg by mouth as needed.      . citalopram (CELEXA) 10 MG tablet Take 5 mg by mouth 2 (two) times daily.        . colesevelam (WELCHOL) 625 MG tablet Take 1,875 mg by mouth daily. 3 pills in the morning and 3 pills at night       . dipyridamole-aspirin (AGGRENOX) 25-200 MG per 12 hr capsule Take 1 capsule by mouth 2 (two) times daily.        Marland Kitchen esomeprazole (NEXIUM) 40 MG capsule Take 40 mg by mouth daily.       . fish oil-omega-3 fatty acids 1000 MG capsule Take 1,200 mg by mouth daily.       . Multiple Vitamin (MULTIVITAMIN) tablet Take 1 tablet by mouth daily.        . Vitamin D, Ergocalciferol, (DRISDOL) 50000 UNITS CAPS Take 50,000 Units by mouth every 7 (seven) days.       . nitroGLYCERIN (NITROSTAT) 0.4 MG SL tablet Place 1 tablet (0.4 mg total) under the tongue every 5 (five) minutes as needed for  chest pain.  100 tablet  3   No current facility-administered medications for this visit.    Allergies  Allergen Reactions  . Lipitor [Atorvastatin Calcium]   . Nsaids   . Statins     Past Medical History  Diagnosis Date  . Syncope and collapse   . Hypertension   . Coronary artery disease     NONOBSTRUCTIVE by cath 2008  . History of transient ischemic attack (TIA)   . GERD (gastroesophageal reflux disease)   . History of prostate cancer   . Hyperlipidemia   . LBBB (left bundle branch block)     Past Surgical History  Procedure Laterality Date  . Cardiac catheterization  08/28/2006    EF 60%  . Tonsillectomy    . Transthoracic echocardiogram  10/10/2008    EF 55%    History  Smoking status  . Never Smoker   Smokeless tobacco  . Not on file    History  Alcohol Use No    Family History  Problem Relation Age of Onset  . Ovarian cancer Mother   . Prostate cancer Father     Review of Systems: The review of systems is per  the HPI.  All other systems were reviewed and are negative.  Physical Exam: BP 110/70  Pulse 76  Ht 6\' 2"  (1.88 m)  Wt 183 lb 6.4 oz (83.19 kg)  BMI 23.54 kg/m2 Patient is very pleasant and in no acute distress. Looks younger than his stated age. Skin is warm and dry. Color is normal.  HEENT is unremarkable. Normocephalic/atraumatic. PERRL. Sclera are nonicteric. Neck is supple. No masses. No JVD. Lungs are clear. Cardiac exam shows a regular rate and rhythm. Abdomen is soft. Extremities are without edema. Gait and ROM are intact. No gross neurologic deficits noted.  LABORATORY DATA: N/A    Chemistry      Component Value Date/Time   NA 136 10/13/2008 0540   K 4.1 HEMOLYZED SPECIMEN, RESULTS MAY BE AFFECTED 10/13/2008 0540   CL 112 10/13/2008 0540   CO2 20 10/13/2008 0540   BUN 11 10/13/2008 0540   CREATININE 1.16 10/13/2008 0540      Component Value Date/Time   CALCIUM 8.3* 10/13/2008 0540   ALKPHOS 54 10/12/2008 0530   AST 84* 10/12/2008 0530    ALT 115* 10/12/2008 0530   BILITOT 0.7 10/12/2008 0530     Lab Results  Component Value Date   WBC 4.5 10/13/2008   HGB 12.8* 10/13/2008   HCT 36.6* 10/13/2008   MCV 97.9 10/13/2008   PLT 174 10/13/2008   No results found for this basename: CHOL,  HDL,  LDLCALC,  LDLDIRECT,  TRIG,  CHOLHDL     Assessment / Plan: 1. HTN - BP by me is great at 118/60. Needs to get his cuff checked to assess for correlation - I have advised that he use his Norvasc only for systolic consistently staying above 170, to keep a diary and watch his salt.  2. CAD - no symptoms reported.   3. HLD - on Welchol - labs by PCP  4. Dizziness - sounds positional.   Would see him back in 6 months. Labs by PCP. He will continue to monitor his BP at home and get his cuff checked.   Patient is agreeable to this plan and will call if any problems develop in the interim.   Burtis Junes, RN, [facility] [address] [place],   [address]

## 2013-09-06 DIAGNOSIS — Z23 Encounter for immunization: Secondary | ICD-10-CM | POA: Diagnosis not present

## 2013-09-06 DIAGNOSIS — I10 Essential (primary) hypertension: Secondary | ICD-10-CM | POA: Diagnosis not present

## 2013-09-06 DIAGNOSIS — M25519 Pain in unspecified shoulder: Secondary | ICD-10-CM | POA: Diagnosis not present

## 2013-11-01 DIAGNOSIS — E785 Hyperlipidemia, unspecified: Secondary | ICD-10-CM | POA: Diagnosis not present

## 2013-11-01 DIAGNOSIS — E559 Vitamin D deficiency, unspecified: Secondary | ICD-10-CM | POA: Diagnosis not present

## 2013-11-01 DIAGNOSIS — R918 Other nonspecific abnormal finding of lung field: Secondary | ICD-10-CM | POA: Diagnosis not present

## 2013-11-01 DIAGNOSIS — I10 Essential (primary) hypertension: Secondary | ICD-10-CM | POA: Diagnosis not present

## 2013-11-02 DIAGNOSIS — E785 Hyperlipidemia, unspecified: Secondary | ICD-10-CM | POA: Diagnosis not present

## 2013-11-02 DIAGNOSIS — I10 Essential (primary) hypertension: Secondary | ICD-10-CM | POA: Diagnosis not present

## 2013-11-02 DIAGNOSIS — E559 Vitamin D deficiency, unspecified: Secondary | ICD-10-CM | POA: Diagnosis not present

## 2013-11-14 ENCOUNTER — Ambulatory Visit: Payer: Self-pay | Admitting: Family Medicine

## 2013-11-14 DIAGNOSIS — N2889 Other specified disorders of kidney and ureter: Secondary | ICD-10-CM | POA: Diagnosis not present

## 2013-11-14 DIAGNOSIS — J984 Other disorders of lung: Secondary | ICD-10-CM | POA: Diagnosis not present

## 2013-12-05 DIAGNOSIS — C61 Malignant neoplasm of prostate: Secondary | ICD-10-CM | POA: Diagnosis not present

## 2013-12-05 DIAGNOSIS — Z8546 Personal history of malignant neoplasm of prostate: Secondary | ICD-10-CM | POA: Diagnosis not present

## 2014-02-13 ENCOUNTER — Ambulatory Visit (INDEPENDENT_AMBULATORY_CARE_PROVIDER_SITE_OTHER): Payer: Medicare Other | Admitting: Cardiology

## 2014-02-13 ENCOUNTER — Encounter: Payer: Self-pay | Admitting: Cardiology

## 2014-02-13 VITALS — BP 110/70 | HR 64 | Ht 74.0 in | Wt 187.7 lb

## 2014-02-13 DIAGNOSIS — I447 Left bundle-branch block, unspecified: Secondary | ICD-10-CM

## 2014-02-13 DIAGNOSIS — I10 Essential (primary) hypertension: Secondary | ICD-10-CM

## 2014-02-13 DIAGNOSIS — E785 Hyperlipidemia, unspecified: Secondary | ICD-10-CM

## 2014-02-13 NOTE — Patient Instructions (Signed)
Continue your current therapy  I will see you in 6 months.   

## 2014-02-14 NOTE — Progress Notes (Signed)
Keith Woods Date of Birth: 1925/12/15 Medical Record #[id number]  History of Present Illness: [name] is seen back today for a 6 month check.  He has nonobstructive CAD per cath back in 2008, HLD, GERD, HTN and prostatae cancer. He has had past issues with orthostatic dizziness  Now takes Norvasc only prn when his BP is high- about every 3-4 days. This has eliminated the orthostatic symptoms.  Past TIA and he is on chronic Aggrenox.   No chest pain. More limited by back pain. Does complain of chronic low energy.  Current Outpatient Prescriptions  Medication Sig Dispense Refill  . amLODipine (NORVASC) 2.5 MG tablet Take 2.5 mg by mouth as needed.      . citalopram (CELEXA) 10 MG tablet Take 5 mg by mouth 2 (two) times daily.        . colesevelam (WELCHOL) 625 MG tablet Take 1,875 mg by mouth daily. 3 pills in the morning and 3 pills at night       . dipyridamole-aspirin (AGGRENOX) 25-200 MG per 12 hr capsule Take 1 capsule by mouth 2 (two) times daily.        Marland Kitchen esomeprazole (NEXIUM) 40 MG capsule Take 40 mg by mouth daily.       . fish oil-omega-3 fatty acids 1000 MG capsule Take 1,200 mg by mouth daily.       . Multiple Vitamin (MULTIVITAMIN) tablet Take 1 tablet by mouth daily.        . nitroGLYCERIN (NITROSTAT) 0.4 MG SL tablet Place 1 tablet (0.4 mg total) under the tongue every 5 (five) minutes as needed for chest pain.  100 tablet  3  . Vitamin D, Ergocalciferol, (DRISDOL) 50000 UNITS CAPS Take 50,000 Units by mouth every 7 (seven) days.        No current facility-administered medications for this visit.    Allergies  Allergen Reactions  . Lipitor [Atorvastatin Calcium]   . Nsaids   . Statins     Past Medical History  Diagnosis Date  . Syncope and collapse   . Hypertension   . Coronary artery disease     NONOBSTRUCTIVE by cath 2008  . History of transient ischemic attack (TIA)   . GERD (gastroesophageal reflux disease)   . History of prostate cancer   .  Hyperlipidemia   . LBBB (left bundle branch block)     Past Surgical History  Procedure Laterality Date  . Cardiac catheterization  08/28/2006    EF 60%  . Tonsillectomy    . Transthoracic echocardiogram  10/10/2008    EF 55%    History  Smoking status  . Never Smoker   Smokeless tobacco  . Not on file    History  Alcohol Use No    Family History  Problem Relation Age of Onset  . Ovarian cancer Mother   . Prostate cancer Father     Review of Systems: The review of systems is per the HPI.  All other systems were reviewed and are negative.  Physical Exam: BP 110/70  Pulse 64  Ht 6\' 2"  (1.88 m)  Wt 187 lb 11.2 oz (85.14 kg)  BMI 24.09 kg/m2 Patient is very pleasant and in no acute distress. Looks younger than his stated age. Skin is warm and dry. Color is normal.  HEENT is unremarkable. Normocephalic/atraumatic. PERRL. Sclera are nonicteric. Neck is supple. No masses. No JVD. Lungs are clear. Cardiac exam shows a regular rate and rhythm. Abdomen is soft. Extremities are without  edema. Gait and ROM are intact. No gross neurologic deficits noted.  LABORATORY DATA:   Ecg: NSR, LAD, LBBB. No change from prior studies.    Chemistry      Component Value Date/Time   NA 136 10/13/2008 0540   K 4.1 HEMOLYZED SPECIMEN, RESULTS MAY BE AFFECTED 10/13/2008 0540   CL 112 10/13/2008 0540   CO2 20 10/13/2008 0540   BUN 11 10/13/2008 0540   CREATININE 1.16 10/13/2008 0540      Component Value Date/Time   CALCIUM 8.3* 10/13/2008 0540   ALKPHOS 54 10/12/2008 0530   AST 84* 10/12/2008 0530   ALT 115* 10/12/2008 0530   BILITOT 0.7 10/12/2008 0530     Lab Results  Component Value Date   WBC 4.5 10/13/2008   HGB 12.8* 10/13/2008   HCT 36.6* 10/13/2008   MCV 97.9 10/13/2008   PLT 174 10/13/2008   No results found for this basename: CHOL,  HDL,  LDLCALC,  LDLDIRECT,  TRIG,  CHOLHDL     Assessment / Plan: 1. HTN - BP welll controlled today. I have advised that he use his Norvasc prn only for systolic  consistently staying above 170, to keep a diary and watch his salt.  2. CAD - no symptoms reported.   3. HLD - on Welchol - labs by PCP  I have encouraged him to be more active and do some walking daily. I think over time this will help his energy level.  I will follow up in 6 months.

## 2014-04-10 DIAGNOSIS — I679 Cerebrovascular disease, unspecified: Secondary | ICD-10-CM | POA: Diagnosis not present

## 2014-04-10 DIAGNOSIS — L989 Disorder of the skin and subcutaneous tissue, unspecified: Secondary | ICD-10-CM | POA: Diagnosis not present

## 2014-04-10 DIAGNOSIS — L821 Other seborrheic keratosis: Secondary | ICD-10-CM | POA: Diagnosis not present

## 2014-04-10 DIAGNOSIS — D1801 Hemangioma of skin and subcutaneous tissue: Secondary | ICD-10-CM | POA: Diagnosis not present

## 2014-04-21 DIAGNOSIS — L0291 Cutaneous abscess, unspecified: Secondary | ICD-10-CM | POA: Diagnosis not present

## 2014-04-21 DIAGNOSIS — L0390 Cellulitis, unspecified: Secondary | ICD-10-CM | POA: Diagnosis not present

## 2014-04-21 DIAGNOSIS — M7020 Olecranon bursitis, unspecified elbow: Secondary | ICD-10-CM | POA: Diagnosis not present

## 2014-04-21 DIAGNOSIS — I10 Essential (primary) hypertension: Secondary | ICD-10-CM | POA: Diagnosis not present

## 2014-04-21 DIAGNOSIS — R918 Other nonspecific abnormal finding of lung field: Secondary | ICD-10-CM | POA: Diagnosis not present

## 2014-04-25 DIAGNOSIS — R918 Other nonspecific abnormal finding of lung field: Secondary | ICD-10-CM | POA: Diagnosis not present

## 2014-04-25 DIAGNOSIS — I10 Essential (primary) hypertension: Secondary | ICD-10-CM | POA: Diagnosis not present

## 2014-04-25 DIAGNOSIS — S63509A Unspecified sprain of unspecified wrist, initial encounter: Secondary | ICD-10-CM | POA: Diagnosis not present

## 2014-04-25 DIAGNOSIS — L0390 Cellulitis, unspecified: Secondary | ICD-10-CM | POA: Diagnosis not present

## 2014-04-25 DIAGNOSIS — L0291 Cutaneous abscess, unspecified: Secondary | ICD-10-CM | POA: Diagnosis not present

## 2014-05-09 DIAGNOSIS — L0390 Cellulitis, unspecified: Secondary | ICD-10-CM | POA: Diagnosis not present

## 2014-05-09 DIAGNOSIS — I10 Essential (primary) hypertension: Secondary | ICD-10-CM | POA: Diagnosis not present

## 2014-05-09 DIAGNOSIS — Z23 Encounter for immunization: Secondary | ICD-10-CM | POA: Diagnosis not present

## 2014-05-09 DIAGNOSIS — L0291 Cutaneous abscess, unspecified: Secondary | ICD-10-CM | POA: Diagnosis not present

## 2014-05-09 DIAGNOSIS — S63509A Unspecified sprain of unspecified wrist, initial encounter: Secondary | ICD-10-CM | POA: Diagnosis not present

## 2014-05-09 DIAGNOSIS — R918 Other nonspecific abnormal finding of lung field: Secondary | ICD-10-CM | POA: Diagnosis not present

## 2014-06-28 DIAGNOSIS — I951 Orthostatic hypotension: Secondary | ICD-10-CM | POA: Diagnosis not present

## 2014-06-29 DIAGNOSIS — I951 Orthostatic hypotension: Secondary | ICD-10-CM | POA: Diagnosis not present

## 2014-06-29 DIAGNOSIS — R739 Hyperglycemia, unspecified: Secondary | ICD-10-CM | POA: Diagnosis not present

## 2014-07-03 DIAGNOSIS — I10 Essential (primary) hypertension: Secondary | ICD-10-CM | POA: Diagnosis not present

## 2014-08-16 DIAGNOSIS — G903 Multi-system degeneration of the autonomic nervous system: Secondary | ICD-10-CM | POA: Diagnosis not present

## 2014-08-16 DIAGNOSIS — I10 Essential (primary) hypertension: Secondary | ICD-10-CM | POA: Diagnosis not present

## 2014-08-21 ENCOUNTER — Ambulatory Visit (INDEPENDENT_AMBULATORY_CARE_PROVIDER_SITE_OTHER): Payer: Medicare Other | Admitting: Cardiology

## 2014-08-21 ENCOUNTER — Encounter: Payer: Self-pay | Admitting: Cardiology

## 2014-08-21 VITALS — BP 146/87 | HR 71 | Ht 74.0 in | Wt 187.9 lb

## 2014-08-21 DIAGNOSIS — I2510 Atherosclerotic heart disease of native coronary artery without angina pectoris: Secondary | ICD-10-CM | POA: Diagnosis not present

## 2014-08-21 DIAGNOSIS — I447 Left bundle-branch block, unspecified: Secondary | ICD-10-CM | POA: Diagnosis not present

## 2014-08-21 DIAGNOSIS — I10 Essential (primary) hypertension: Secondary | ICD-10-CM

## 2014-08-21 NOTE — Progress Notes (Signed)
[place] Ernestina Patches [place] Date of Birth: Jun 16, 1926 Medical Record #[id number]  History of Present Illness: [name] is seen for follow up of HTN.  He has nonobstructive CAD per cath back in 2008, HLD, GERD, HTN and prostate cancer. He has had past issues with orthostatic dizziness  He was taking amlodipine several times a week when his BP was too high. He then was taking it daily. Increase in dose to 5 mg resulted in orthostatic hypotension. He was tried on losartan but this caused him to lose his balance. He took one dose of Pindolol but was concerned that this may drop his BP too low. His am BP readings have been excellent but his BP increases in the evening to 150-160. He denies any chest pain, SOB, or recurrent TIA symptoms.    Current Outpatient Prescriptions  Medication Sig Dispense Refill  . amLODipine (NORVASC) 2.5 MG tablet Take 2.5 mg by mouth as needed.    . citalopram (CELEXA) 10 MG tablet Take 5 mg by mouth 2 (two) times daily.      . colesevelam (WELCHOL) 625 MG tablet Take 1,875 mg by mouth daily. 3 pills in the morning and 3 pills at night     . dipyridamole-aspirin (AGGRENOX) 25-200 MG per 12 hr capsule Take 1 capsule by mouth 2 (two) times daily.      Marland Kitchen esomeprazole (NEXIUM) 40 MG capsule Take 40 mg by mouth daily.     . fish oil-omega-3 fatty acids 1000 MG capsule Take 1,200 mg by mouth daily.     . Multiple Vitamin (MULTIVITAMIN) tablet Take 1 tablet by mouth daily.      . nitroGLYCERIN (NITROSTAT) 0.4 MG SL tablet Place 1 tablet (0.4 mg total) under the tongue every 5 (five) minutes as needed for chest pain. 100 tablet 3  . Probiotic Product (ALIGN) 4 MG CAPS Take 4 mg by mouth daily.    . Vitamin D, Ergocalciferol, (DRISDOL) 50000 UNITS CAPS Take 50,000 Units by mouth every 7 (seven) days.      No current facility-administered medications for this visit.    Allergies  Allergen Reactions  . Lipitor [Atorvastatin Calcium]   . Nsaids   . Statins     Past Medical  History  Diagnosis Date  . Syncope and collapse   . Hypertension   . Coronary artery disease     NONOBSTRUCTIVE by cath 2008  . History of transient ischemic attack (TIA)   . GERD (gastroesophageal reflux disease)   . History of prostate cancer   . Hyperlipidemia   . LBBB (left bundle branch block)     Past Surgical History  Procedure Laterality Date  . Cardiac catheterization  08/28/2006    EF 60%  . Tonsillectomy    . Transthoracic echocardiogram  10/10/2008    EF 55%    History  Smoking status  . Never Smoker   Smokeless tobacco  . Not on file    History  Alcohol Use No    Family History  Problem Relation Age of Onset  . Ovarian cancer Mother   . Prostate cancer Father     Review of Systems: The review of systems is per the HPI.  All other systems were reviewed and are negative.  Physical Exam: BP 146/87 mmHg  Pulse 71  Ht 6\' 2"  (1.88 m)  Wt 187 lb 14.4 oz (85.231 kg)  BMI 24.11 kg/m2 Patient is very pleasant and in no acute distress.  Skin is warm and dry. Color  is normal.  HEENT is unremarkable.  Neck is supple. No masses. No JVD. Lungs are clear. Cardiac exam shows a regular rate and rhythm. Normal S1-2. no gallop or murmur. Abdomen is soft. Extremities are without edema. Gait and ROM are intact. No gross neurologic deficits noted.  LABORATORY DATA:      Assessment / Plan: 1. HTN - BP is labile. He has a history of orthostatic hypotension when BP aggressively treated. I have recommended he take Pindolol 5 mg once a day around noon since his readings tend to be higher in the evening. Continue amlodipine if BP is high. I think it is important to avoid hypotension given his age and cerebrovascular disease.   2. CAD - nonobstructive. No symptoms reported.   3. HLD - on Welchol - labs by PCP   I will follow up in 6 months.

## 2014-08-21 NOTE — Patient Instructions (Signed)
Try taking the pindolol 5 mg daily at noon. If you need to you can take amlodipine 2.5 mg in the evening for systolic BP greater than 037.  Continue your other therapy  I will see you in 6 months.

## 2014-08-22 ENCOUNTER — Ambulatory Visit: Payer: Self-pay | Admitting: Family Medicine

## 2014-08-22 DIAGNOSIS — R918 Other nonspecific abnormal finding of lung field: Secondary | ICD-10-CM | POA: Diagnosis not present

## 2014-08-22 DIAGNOSIS — Z8546 Personal history of malignant neoplasm of prostate: Secondary | ICD-10-CM | POA: Diagnosis not present

## 2014-08-22 DIAGNOSIS — M479 Spondylosis, unspecified: Secondary | ICD-10-CM | POA: Diagnosis not present

## 2014-09-14 DIAGNOSIS — Z8546 Personal history of malignant neoplasm of prostate: Secondary | ICD-10-CM | POA: Diagnosis not present

## 2014-09-14 DIAGNOSIS — I10 Essential (primary) hypertension: Secondary | ICD-10-CM | POA: Diagnosis not present

## 2014-10-17 DIAGNOSIS — Z8546 Personal history of malignant neoplasm of prostate: Secondary | ICD-10-CM | POA: Diagnosis not present

## 2014-11-06 DIAGNOSIS — H8110 Benign paroxysmal vertigo, unspecified ear: Secondary | ICD-10-CM | POA: Diagnosis not present

## 2014-11-06 DIAGNOSIS — N183 Chronic kidney disease, stage 3 (moderate): Secondary | ICD-10-CM | POA: Diagnosis not present

## 2014-11-06 DIAGNOSIS — I10 Essential (primary) hypertension: Secondary | ICD-10-CM | POA: Diagnosis not present

## 2014-11-13 DIAGNOSIS — N183 Chronic kidney disease, stage 3 (moderate): Secondary | ICD-10-CM | POA: Diagnosis not present

## 2014-11-20 DIAGNOSIS — H8110 Benign paroxysmal vertigo, unspecified ear: Secondary | ICD-10-CM | POA: Diagnosis not present

## 2014-11-20 DIAGNOSIS — N183 Chronic kidney disease, stage 3 (moderate): Secondary | ICD-10-CM | POA: Diagnosis not present

## 2014-11-27 DIAGNOSIS — G909 Disorder of the autonomic nervous system, unspecified: Secondary | ICD-10-CM | POA: Diagnosis not present

## 2014-11-27 DIAGNOSIS — N184 Chronic kidney disease, stage 4 (severe): Secondary | ICD-10-CM | POA: Diagnosis not present

## 2014-11-27 DIAGNOSIS — N2581 Secondary hyperparathyroidism of renal origin: Secondary | ICD-10-CM | POA: Diagnosis not present

## 2014-11-27 DIAGNOSIS — D631 Anemia in chronic kidney disease: Secondary | ICD-10-CM | POA: Diagnosis not present

## 2014-12-13 DIAGNOSIS — N184 Chronic kidney disease, stage 4 (severe): Secondary | ICD-10-CM | POA: Diagnosis not present

## 2014-12-29 DIAGNOSIS — D631 Anemia in chronic kidney disease: Secondary | ICD-10-CM | POA: Diagnosis not present

## 2014-12-29 DIAGNOSIS — E875 Hyperkalemia: Secondary | ICD-10-CM | POA: Diagnosis not present

## 2014-12-29 DIAGNOSIS — N184 Chronic kidney disease, stage 4 (severe): Secondary | ICD-10-CM | POA: Diagnosis not present

## 2014-12-29 DIAGNOSIS — E872 Acidosis: Secondary | ICD-10-CM | POA: Diagnosis not present

## 2014-12-29 DIAGNOSIS — N2581 Secondary hyperparathyroidism of renal origin: Secondary | ICD-10-CM | POA: Diagnosis not present

## 2015-01-01 DIAGNOSIS — J329 Chronic sinusitis, unspecified: Secondary | ICD-10-CM | POA: Diagnosis not present

## 2015-01-01 DIAGNOSIS — H8110 Benign paroxysmal vertigo, unspecified ear: Secondary | ICD-10-CM | POA: Diagnosis not present

## 2015-01-09 DIAGNOSIS — J329 Chronic sinusitis, unspecified: Secondary | ICD-10-CM | POA: Diagnosis not present

## 2015-01-09 DIAGNOSIS — H8110 Benign paroxysmal vertigo, unspecified ear: Secondary | ICD-10-CM | POA: Diagnosis not present

## 2015-01-09 DIAGNOSIS — R42 Dizziness and giddiness: Secondary | ICD-10-CM | POA: Diagnosis not present

## 2015-01-10 ENCOUNTER — Telehealth: Payer: Self-pay

## 2015-01-10 NOTE — Telephone Encounter (Signed)
Patient advised and verbally voiced understanding. rlc

## 2015-01-10 NOTE — Telephone Encounter (Signed)
-----   Message from [name], MD sent at 01/10/2015  3:52 PM EDT ----- Regarding: Lab results Please advise patient that labs are back which show he is fairly dehydrated. This has caused kidney functions to worse. All other labs are normal. He needs to work on drinking more water and stop the antibiotic if he has not already done so. He should feel better in a day or two if he is able to drink more fluids.

## 2015-01-12 DIAGNOSIS — N184 Chronic kidney disease, stage 4 (severe): Secondary | ICD-10-CM | POA: Diagnosis not present

## 2015-01-15 ENCOUNTER — Encounter: Payer: Self-pay | Admitting: Family Medicine

## 2015-02-02 ENCOUNTER — Encounter: Payer: Self-pay | Admitting: Family Medicine

## 2015-02-02 ENCOUNTER — Ambulatory Visit (INDEPENDENT_AMBULATORY_CARE_PROVIDER_SITE_OTHER): Payer: Medicare Other | Admitting: Family Medicine

## 2015-02-02 VITALS — BP 138/80 | HR 84 | Temp 98.0°F | Resp 16 | Wt 181.0 lb

## 2015-02-02 DIAGNOSIS — N184 Chronic kidney disease, stage 4 (severe): Secondary | ICD-10-CM | POA: Diagnosis not present

## 2015-02-02 DIAGNOSIS — H9193 Unspecified hearing loss, bilateral: Secondary | ICD-10-CM | POA: Diagnosis not present

## 2015-02-02 DIAGNOSIS — R42 Dizziness and giddiness: Secondary | ICD-10-CM | POA: Diagnosis not present

## 2015-02-02 DIAGNOSIS — I2510 Atherosclerotic heart disease of native coronary artery without angina pectoris: Secondary | ICD-10-CM | POA: Diagnosis not present

## 2015-02-02 DIAGNOSIS — H9190 Unspecified hearing loss, unspecified ear: Secondary | ICD-10-CM | POA: Insufficient documentation

## 2015-02-02 DIAGNOSIS — N189 Chronic kidney disease, unspecified: Secondary | ICD-10-CM | POA: Insufficient documentation

## 2015-02-02 MED ORDER — CITALOPRAM HYDROBROMIDE 10 MG PO TABS: 10.0000 mg | ORAL_TABLET | Freq: Two times a day (BID) | ORAL | Status: DC

## 2015-02-02 MED ORDER — CITALOPRAM HYDROBROMIDE 10 MG PO TABS: 5.0000 mg | ORAL_TABLET | Freq: Two times a day (BID) | ORAL | Status: DC

## 2015-02-02 NOTE — Progress Notes (Signed)
Subjective:    Patient ID: Keith Woods, male    DOB: 1925/08/24, 81 y.o.   MRN: [id number]  Dizziness This is a chronic problem. The current episode started more than 1 month ago. The problem occurs 2 to 4 times per day. The problem has been unchanged. Associated symptoms include fatigue (Pt reports having no energy.), headaches (Pt reports having "minor" headaches since fallling last month.  ), numbness (Left foot is numb.  ) and weakness (Legs feel weak, and sometimes painful.). Pertinent negatives include no chest pain, chills, congestion, coughing, diaphoresis, fever or sore throat. The symptoms are aggravated by standing, twisting and walking (Pt reports "movement" aggravates his dizziness). He has tried drinking for the symptoms. The treatment provided no relief.   He has a long history of orthostatic hypotension, and he still gets light headaches briefly when he stands, but now he feels dizzy just be turning his head quickly or rolling over when lying down. He says that he feels like he continues to move even after he stops moving, or stops moving his head. He also reports that his hearing seems to have gotten a lot worse the last month or so, and is having more ringing in his ear.   He has stopped taking pindolol as his blood pressure has been running low, usually around 100, but sometimes it jumps to 130.   BUN and creatinine has increased a bit to 33 2.37 when checked last month, so he has been drinking a lot more water. His wife states his appetite has been coming back the last 2 weeks and is eating a lot better, but he still feels very fatigues and sleeps a lot during the day. His next follow up with nephrology is in August, but he sees cardiology next week.      Patient Active Problem List   Diagnosis Date Noted  . H/O malignant neoplasm of prostate 12/06/2012  . LBBB (left bundle branch block)   . Hypertension   . Coronary artery disease   . Hyperlipidemia    Family  History  Problem Relation Age of Onset  . Ovarian cancer Mother   . Prostate cancer Father    History   Social History  . Marital Status: Married    Spouse Name: N/A  . Number of Children: 3  . Years of Education: N/A   Occupational History  . minister-retired    Social History Main Topics  . Smoking status: Never Smoker   . Smokeless tobacco: Not on file  . Alcohol Use: No  . Drug Use: No  . Sexual Activity: Not on file   Other Topics Concern  . Not on file   Social History Narrative   Past Surgical History  Procedure Laterality Date  . Cardiac catheterization  08/28/2006    EF 60%  . Tonsillectomy    . Transthoracic echocardiogram  10/10/2008    EF 55%   Allergies  Allergen Reactions  . Lipitor [Atorvastatin Calcium]   . Nsaids   . Statins    Previous Medications   AMLODIPINE (NORVASC) 2.5 MG TABLET    Take 2.5 mg by mouth as needed.   COLESEVELAM (WELCHOL) 625 MG TABLET    Take 1,875 mg by mouth daily. 3 pills in the morning and 3 pills at night    ESCITALOPRAM 10MG  TABLET    Take 1/2 tablet daily   DIPYRIDAMOLE-ASPIRIN (AGGRENOX) 25-200 MG PER 12 HR CAPSULE    Take 1 capsule by mouth 2 (  two) times daily.     ESOMEPRAZOLE (NEXIUM) 40 MG CAPSULE    Take 40 mg by mouth daily.    FISH OIL-OMEGA-3 FATTY ACIDS 1000 MG CAPSULE    Take 1,200 mg by mouth daily.    FLUTICASONE (FLONASE) 50 MCG/ACT NASAL SPRAY    instill 2 sprays into each nostril once daily   MULTIPLE VITAMIN (MULTIVITAMIN) TABLET    Take 1 tablet by mouth daily.     NITROGLYCERIN (NITROSTAT) 0.4 MG SL TABLET    Place 1 tablet (0.4 mg total) under the tongue every 5 (five) minutes as needed for chest pain.   PROBIOTIC PRODUCT (ALIGN) 4 MG CAPS    Take 4 mg by mouth daily.   SODIUM BICARBONATE 650 MG TABLET    Take 650 mg by mouth 2 (two) times daily.   VITAMIN D, ERGOCALCIFEROL, (DRISDOL) 50000 UNITS CAPS    Take 50,000 Units by mouth every 7 (seven) days.    BP 138/80 mmHg  Pulse 84  Temp(Src) 98 F  (36.7 C) (Oral)  Resp 16  Wt 181 lb (82.101 kg)  Patient Care Team    Relationship Specialty Notifications Start End  [name], MD PCP - General Family Medicine  04/23/11    Comment: Merged (Merged)  [name], MD Consulting Physician Cardiology  02/02/15   Lavonia Dana, MD Consulting Physician Nephrology  02/02/15      Review of Systems  Constitutional: Positive for fatigue (Pt reports having no energy.). Negative for fever, chills, diaphoresis, activity change, appetite change and unexpected weight change.  HENT: Negative for congestion, dental problem, ear discharge, ear pain, facial swelling, hearing loss, mouth sores, nosebleeds, postnasal drip, rhinorrhea, sinus pressure, sneezing, sore throat, tinnitus, trouble swallowing and voice change.   Respiratory: Negative for apnea, cough, choking, chest tightness, shortness of breath, wheezing and stridor.   Cardiovascular: Negative for chest pain, palpitations and leg swelling.  Gastrointestinal: Positive for abdominal distention. Negative for blood in stool.  Neurological: Positive for dizziness, weakness (Legs feel weak, and sometimes painful.), light-headedness, numbness (Left foot is numb.  ) and headaches (Pt reports having "minor" headaches since fallling last month.  ). Negative for tremors, seizures, syncope, facial asymmetry and speech difficulty.       Objective:   Physical Exam  General Appearance:    Alert, cooperative, no distress  HENT:   ENT exam normal, no neck nodes or sinus tenderness  Eyes:    PERRL, conjunctiva/corneas clear, EOM's intact       Lungs:     Clear to auscultation bilaterally, respirations unlabored  Heart:    Regular rate and rhythm  Neurologic:   Awake, alert, oriented x 3. No apparent focal neurological           defect. Normal finger to nose. Negative rhomberg           Assessment & Plan:   1. Dizziness He has a history of orthostasis, but he is off of all BP medications, and he  is bother more by sensations of movement when changing head position. Considering he has had a noticeable change in hearing I think an ENT evaluation is warranted.  2. Chronic kidney disease, stage 4 (severe) Has been drinking more fluids and expect a bit of improvement in renal functions.  - Renal Function Panel  3. Vertigo Suspected, as above.  - Ambulatory referral to ENT  4. Hearing loss, bilateral  - Ambulatory referral to ENT  5. Fatigue - He is  eating and drinking better, but still feels very fatigued. Suggested he add 500mg  B12 to his vitamins. I suspect his depression is getting worse as he has history of a lot of somatic effects from depression in the past. Will increase citalopram back up to 1/2 tablet twice a day, and consider increasing further to 1 tablet twice a day if he tolerates its.

## 2015-02-02 NOTE — Patient Instructions (Addendum)
Increase citalopram to 1/2 tablet twice a day

## 2015-02-03 LAB — RENAL FUNCTION PANEL
Albumin: 4.3 g/dL (ref 3.5–4.7)
BUN / CREAT RATIO: 11 (ref 10–22)
BUN: 24 mg/dL (ref 8–27)
CHLORIDE: 101 mmol/L (ref 97–108)
CO2: 21 mmol/L (ref 18–29)
CREATININE: 2.09 mg/dL — AB (ref 0.76–1.27)
Calcium: 10 mg/dL (ref 8.6–10.2)
GFR, EST AFRICAN AMERICAN: 32 mL/min/{1.73_m2} — AB (ref >59)
GFR, EST NON AFRICAN AMERICAN: 28 mL/min/{1.73_m2} — AB (ref >59)
Glucose: 101 mg/dL — ABNORMAL HIGH (ref 65–99)
Phosphorus: 2.8 mg/dL (ref 2.5–4.5)
Potassium: 5 mmol/L (ref 3.5–5.2)
SODIUM: 139 mmol/L (ref 134–144)

## 2015-02-13 DIAGNOSIS — R42 Dizziness and giddiness: Secondary | ICD-10-CM | POA: Diagnosis not present

## 2015-02-13 DIAGNOSIS — H903 Sensorineural hearing loss, bilateral: Secondary | ICD-10-CM | POA: Diagnosis not present

## 2015-02-13 DIAGNOSIS — H6122 Impacted cerumen, left ear: Secondary | ICD-10-CM | POA: Diagnosis not present

## 2015-02-15 DIAGNOSIS — R42 Dizziness and giddiness: Secondary | ICD-10-CM | POA: Diagnosis not present

## 2015-02-20 ENCOUNTER — Encounter: Payer: Self-pay | Admitting: Cardiology

## 2015-02-20 ENCOUNTER — Ambulatory Visit (INDEPENDENT_AMBULATORY_CARE_PROVIDER_SITE_OTHER): Payer: Medicare Other | Admitting: Cardiology

## 2015-02-20 VITALS — BP 144/94 | HR 72 | Ht 74.0 in | Wt 179.7 lb

## 2015-02-20 DIAGNOSIS — I447 Left bundle-branch block, unspecified: Secondary | ICD-10-CM | POA: Diagnosis not present

## 2015-02-20 DIAGNOSIS — N184 Chronic kidney disease, stage 4 (severe): Secondary | ICD-10-CM

## 2015-02-20 DIAGNOSIS — I2510 Atherosclerotic heart disease of native coronary artery without angina pectoris: Secondary | ICD-10-CM | POA: Diagnosis not present

## 2015-02-20 DIAGNOSIS — I10 Essential (primary) hypertension: Secondary | ICD-10-CM

## 2015-02-20 NOTE — Progress Notes (Signed)
Keith Woods Date of Birth: 07-Apr-1926 Medical Record #[id number]  History of Present Illness: Keith Woods is seen for follow up of HTN.  He has nonobstructive CAD per cath back in 2008, HLD, GERD, HTN and prostate cancer. He has had past issues with orthostatic dizziness on medication.   He was tried on losartan but this caused him to lose his balance. He took one dose of Pindolol but was concerned that this may drop his BP too low. He is now taking low dose amlodipine 2.5 mg as needed if BP remains over 160 and this is infrequent. His BP readings are labile ranging from 614-431 systolic. He does report symptoms of dizziness over the past year. His symptoms are not orthostatic. He saw ENT who felt this was not BPV. He has no chest pain or SOB.   Current Outpatient Prescriptions  Medication Sig Dispense Refill  . amLODipine (NORVASC) 2.5 MG tablet Take 2.5 mg by mouth as needed.    . citalopram (CELEXA) 10 MG tablet Take 0.5 tablets (5 mg total) by mouth 2 (two) times daily. 11 tablet 1  . colesevelam (WELCHOL) 625 MG tablet Take 1,875 mg by mouth daily. 3 pills in the morning and 3 pills at night     . dipyridamole-aspirin (AGGRENOX) 25-200 MG per 12 hr capsule Take 1 capsule by mouth 2 (two) times daily.      Marland Kitchen esomeprazole (NEXIUM) 40 MG capsule Take 40 mg by mouth daily.     . fish oil-omega-3 fatty acids 1000 MG capsule Take 1,200 mg by mouth daily.     . fluticasone (FLONASE) 50 MCG/ACT nasal spray instill 2 sprays into each nostril once daily  0  . Multiple Vitamin (MULTIVITAMIN) tablet Take 1 tablet by mouth daily.      . nitroGLYCERIN (NITROSTAT) 0.4 MG SL tablet Place 1 tablet (0.4 mg total) under the tongue every 5 (five) minutes as needed for chest pain. 100 tablet 3  . Probiotic Product (ALIGN) 4 MG CAPS Take 4 mg by mouth daily.    . sodium bicarbonate 650 MG tablet Take 650 mg by mouth 2 (two) times daily.  0  . Vitamin D, Ergocalciferol, (DRISDOL) 50000 UNITS CAPS Take  50,000 Units by mouth every 7 (seven) days.      No current facility-administered medications for this visit.    Allergies  Allergen Reactions  . Lipitor [Atorvastatin Calcium]   . Nsaids   . Statins     Past Medical History  Diagnosis Date  . Syncope and collapse   . Hypertension   . Coronary artery disease     NONOBSTRUCTIVE by cath 2008  . History of transient ischemic attack (TIA)   . GERD (gastroesophageal reflux disease)   . History of prostate cancer   . Hyperlipidemia   . LBBB (left bundle branch block)     Past Surgical History  Procedure Laterality Date  . Cardiac catheterization  08/28/2006    EF 60%  . Tonsillectomy    . Transthoracic echocardiogram  10/10/2008    EF 55%    History  Smoking status  . Never Smoker   Smokeless tobacco  . Not on file    History  Alcohol Use No    Family History  Problem Relation Age of Onset  . Ovarian cancer Mother   . Prostate cancer Father     Review of Systems: The review of systems is per the HPI.  All other systems were reviewed and are  negative.  Physical Exam: BP 144/94 mmHg  Pulse 72  Ht 6\' 2"  (1.88 m)  Wt 81.511 kg (179 lb 11.2 oz)  BMI 23.06 kg/m2 Patient is very pleasant and in no acute distress.  Skin is warm and dry. Color is normal.  HEENT is unremarkable.  Neck is supple. No masses. No JVD. Lungs are clear. Cardiac exam shows a regular rate and rhythm. Normal S1-2. no gallop or murmur. Abdomen is soft. Extremities are without edema. Gait and ROM are intact. No gross neurologic deficits noted.  LABORATORY DATA:  Ecg today shows NSR with rate 72. LAD, LBBB. No acute change. I have personally reviewed and interpreted this study.     Assessment / Plan: 1. HTN - BP is labile but overall satisfactory. He has a history of orthostatic hypotension when BP aggressively treated. I think it is important to avoid hypotension given his age and cerebrovascular disease. Use prn amlodipine as needed.   2.  CAD - nonobstructive. No symptoms reported.   3. HLD - on Welchol - labs by PCP  4. Dizziness. Etiology is unclear.

## 2015-02-20 NOTE — Patient Instructions (Signed)
Continue your current therapy  I will see you in one year   

## 2015-02-28 DIAGNOSIS — R42 Dizziness and giddiness: Secondary | ICD-10-CM | POA: Diagnosis not present

## 2015-03-12 ENCOUNTER — Encounter: Payer: Self-pay | Admitting: Family Medicine

## 2015-03-12 ENCOUNTER — Ambulatory Visit (INDEPENDENT_AMBULATORY_CARE_PROVIDER_SITE_OTHER): Payer: Medicare Other | Admitting: Family Medicine

## 2015-03-12 VITALS — BP 162/98 | HR 96 | Temp 98.1°F | Resp 20 | Wt 181.0 lb

## 2015-03-12 DIAGNOSIS — M25531 Pain in right wrist: Secondary | ICD-10-CM | POA: Diagnosis not present

## 2015-03-12 DIAGNOSIS — I2510 Atherosclerotic heart disease of native coronary artery without angina pectoris: Secondary | ICD-10-CM

## 2015-03-12 DIAGNOSIS — M7022 Olecranon bursitis, left elbow: Secondary | ICD-10-CM

## 2015-03-12 DIAGNOSIS — M25521 Pain in right elbow: Secondary | ICD-10-CM

## 2015-03-12 NOTE — Progress Notes (Signed)
Patient: Keith Woods Male    DOB: 08-05-1926   81 y.o.   MRN: [id number] Visit Date: 03/12/2015  Today's Provider: [name], MD   Chief Complaint  Patient presents with  . Elbow Injury   Subjective:    HPI   Elbow pain: Patient comes in today with a complaint of right elbow pain. Pain has been ongoing for 1 week. Patient states he fell twice 1 week ago while taking out the trash. He also fell backwards on a separate day trying to set a mouse trap underneath the refrigerator. Yesterday he tripped over a step and fell on concrete ground hurting his right wrist. Since the last fall patient states the pain in his elbow has worsened. Patient also has redness and swelling in his elbow.   Allergies  Allergen Reactions  . Lipitor [Atorvastatin Calcium]   . Nsaids   . Statins    Previous Medications   AMLODIPINE (NORVASC) 2.5 MG TABLET    Take 2.5 mg by mouth as needed.   CITALOPRAM (CELEXA) 10 MG TABLET    Take 0.5 tablets (5 mg total) by mouth 2 (two) times daily.   COLESEVELAM (WELCHOL) 625 MG TABLET    Take 1,875 mg by mouth daily. 3 pills in the morning and 3 pills at night    DIPYRIDAMOLE-ASPIRIN (AGGRENOX) 25-200 MG PER 12 HR CAPSULE    Take 1 capsule by mouth 2 (two) times daily.     ESOMEPRAZOLE (NEXIUM) 40 MG CAPSULE    Take 40 mg by mouth daily.    FISH OIL-OMEGA-3 FATTY ACIDS 1000 MG CAPSULE    Take 1,200 mg by mouth daily.    FLUTICASONE (FLONASE) 50 MCG/ACT NASAL SPRAY    instill 2 sprays into each nostril once daily   MULTIPLE VITAMIN (MULTIVITAMIN) TABLET    Take 1 tablet by mouth daily.     NITROGLYCERIN (NITROSTAT) 0.4 MG SL TABLET    Place 1 tablet (0.4 mg total) under the tongue every 5 (five) minutes as needed for chest pain.   PROBIOTIC PRODUCT (ALIGN) 4 MG CAPS    Take 4 mg by mouth daily.   SODIUM BICARBONATE 650 MG TABLET    Take 650 mg by mouth 2 (two) times daily.   VITAMIN D, ERGOCALCIFEROL, (DRISDOL) 50000 UNITS CAPS    Take 50,000 Units by  mouth every 7 (seven) days.     Review of Systems  Constitutional: Positive for fatigue. Negative for fever, chills, diaphoresis, activity change and appetite change.  Musculoskeletal: Positive for joint swelling and arthralgias (right wrist and right elbow).  Skin: Positive for color change (redness in elbow).    History  Substance Use Topics  . Smoking status: Never Smoker   . Smokeless tobacco: Not on file  . Alcohol Use: No   Objective:   BP 162/98 mmHg  Pulse 96  Temp(Src) 98.1 F (36.7 C) (Oral)  Resp 20  Wt 181 lb (82.101 kg)  SpO2 96%  Physical Exam  Mild diffuse swelling of right hand and wrist. Unable to flex or extend more than 10 degrees due to pain. No erythema. Tender along posterior proximal forearm just distal to right  elbow. No erythema or swelling. FROM of right elbow. Grape sized fluctuate swelling over left olecranon.      Assessment & Plan:     1. Elbow joint pain, right - DG Elbow Complete Right; Future  2. Wrist pain, acute, right  - DG Wrist Complete  Right; Future   3. Olecranon bursitis, left Aspirated abour 4cc of serous fluid from left olecranon which was well tolerated without adverse effects.        [name], MD  [facility]

## 2015-03-13 ENCOUNTER — Ambulatory Visit
Admission: RE | Admit: 2015-03-13 | Discharge: 2015-03-13 | Disposition: A | Discharge status: Home / Self Care | Payer: Medicare Other | Source: Ambulatory Visit | Attending: Family Medicine | Admitting: Family Medicine

## 2015-03-13 DIAGNOSIS — M25531 Pain in right wrist: Secondary | ICD-10-CM | POA: Diagnosis not present

## 2015-03-13 DIAGNOSIS — M19031 Primary osteoarthritis, right wrist: Secondary | ICD-10-CM | POA: Insufficient documentation

## 2015-03-13 DIAGNOSIS — S6991XA Unspecified injury of right wrist, hand and finger(s), initial encounter: Secondary | ICD-10-CM | POA: Diagnosis not present

## 2015-03-13 DIAGNOSIS — M8580 Other specified disorders of bone density and structure, unspecified site: Secondary | ICD-10-CM | POA: Diagnosis not present

## 2015-03-13 DIAGNOSIS — M1990 Unspecified osteoarthritis, unspecified site: Secondary | ICD-10-CM | POA: Diagnosis not present

## 2015-03-13 DIAGNOSIS — S59901A Unspecified injury of right elbow, initial encounter: Secondary | ICD-10-CM | POA: Diagnosis not present

## 2015-03-13 DIAGNOSIS — M25521 Pain in right elbow: Secondary | ICD-10-CM | POA: Diagnosis present

## 2015-03-15 ENCOUNTER — Ambulatory Visit: Payer: Medicare Other | Admitting: Physical Therapy

## 2015-03-15 ENCOUNTER — Encounter: Payer: Self-pay | Admitting: Family Medicine

## 2015-03-15 ENCOUNTER — Ambulatory Visit (INDEPENDENT_AMBULATORY_CARE_PROVIDER_SITE_OTHER): Payer: Medicare Other | Admitting: Family Medicine

## 2015-03-15 VITALS — BP 120/80 | HR 76 | Temp 98.6°F | Resp 16 | Wt 177.0 lb

## 2015-03-15 DIAGNOSIS — M25431 Effusion, right wrist: Secondary | ICD-10-CM | POA: Diagnosis not present

## 2015-03-15 DIAGNOSIS — M25539 Pain in unspecified wrist: Secondary | ICD-10-CM | POA: Insufficient documentation

## 2015-03-15 DIAGNOSIS — I2510 Atherosclerotic heart disease of native coronary artery without angina pectoris: Secondary | ICD-10-CM

## 2015-03-15 DIAGNOSIS — S6391XA Sprain of unspecified part of right wrist and hand, initial encounter: Secondary | ICD-10-CM | POA: Diagnosis not present

## 2015-03-15 DIAGNOSIS — M25531 Pain in right wrist: Secondary | ICD-10-CM

## 2015-03-15 DIAGNOSIS — S60221A Contusion of right hand, initial encounter: Secondary | ICD-10-CM | POA: Diagnosis not present

## 2015-03-15 DIAGNOSIS — M7022 Olecranon bursitis, left elbow: Secondary | ICD-10-CM | POA: Diagnosis not present

## 2015-03-15 NOTE — Progress Notes (Signed)
Patient: Keith Woods Male    DOB: 05-22-1926   81 y.o.   MRN: [id number] Visit Date: 03/15/2015  Today's Provider: [name], MD   Chief Complaint  Patient presents with  . Follow-up    6 months  . Hypertension  . Hyperlipidemia  . Chronic Kidney Disease   Subjective:    HPI  Right wrist pain Patient fell over a week ago and was seen her 2 days ago with right wrist and elbow pain. Xray finds severe OA on right hand, but no fractures were seen. He has been wearing on OTC wrist brace, but swelling has gotten much worse the last 2 days and he is unable to flex or extend wrist.   We also aspirated left olecranon bursae 2 days ago, which is starting to swell a bit again, but is not painful.   CKD Follow-up for chronic kidney disease from 11/06/2014; patient now to be followed by nephrology.     Hypertension, follow-up:  BP Readings from Last 3 Encounters:  03/15/15 120/80  03/12/15 162/98  02/20/15 144/94    He was last seen for hypertension 4 months ago.  BP at that visit was 118/70. Management changes since that visit include none .He reports good compliance with treatment. He is not having side effects. none  He is not exercising. He is adherent to low salt diet.   Outside blood pressures are 120/80. ------------------------------------------------------------------------    Lipid/Cholesterol, Follow-up:   Last seen for this 17  months ago.  Management changes since that visit include none.  Last Lipid Panel: No results found for: CHOL, TRIG, HDL, CHOLHDL, VLDL, LDLCALC, LDLDIRECT  He reports good compliance with treatment. He is not having side effects.   Wt Readings from Last 3 Encounters:  03/15/15 177 lb (80.287 kg)  03/12/15 181 lb (82.101 kg)  02/20/15 179 lb 11.2 oz (81.511 kg)    ------------------------------------------------------------------------  Fatigue He states he continues to feel very fatigued with little energy  or motivation. We had him increase citalopram to 1/2 tablet twice a day at his o.v. 6-24, but he hasn't noticed much change. He has had some trouble with dizziness at higher doses.     Allergies  Allergen Reactions  . Lipitor [Atorvastatin Calcium]   . Nsaids   . Statins    Previous Medications   AMLODIPINE (NORVASC) 2.5 MG TABLET    Take 2.5 mg by mouth as needed.   CITALOPRAM (CELEXA) 10 MG TABLET    Take 0.5 tablets (5 mg total) by mouth 2 (two) times daily.   COLESEVELAM (WELCHOL) 625 MG TABLET    Take 1,875 mg by mouth daily. 3 pills in the morning and 3 pills at night    DIPYRIDAMOLE-ASPIRIN (AGGRENOX) 25-200 MG PER 12 HR CAPSULE    Take 1 capsule by mouth 2 (two) times daily.     ESOMEPRAZOLE (NEXIUM) 40 MG CAPSULE    Take 40 mg by mouth daily.    FISH OIL-OMEGA-3 FATTY ACIDS 1000 MG CAPSULE    Take 1,200 mg by mouth daily.    FLUTICASONE (FLONASE) 50 MCG/ACT NASAL SPRAY    instill 2 sprays into each nostril once daily   MULTIPLE VITAMIN (MULTIVITAMIN) TABLET    Take 1 tablet by mouth daily.     NITROGLYCERIN (NITROSTAT) 0.4 MG SL TABLET    Place 1 tablet (0.4 mg total) under the tongue every 5 (five) minutes as needed for chest pain.   PROBIOTIC PRODUCT (  ALIGN) 4 MG CAPS    Take 4 mg by mouth daily.   SODIUM BICARBONATE 650 MG TABLET    Take 650 mg by mouth 2 (two) times daily.   VITAMIN D, ERGOCALCIFEROL, (DRISDOL) 50000 UNITS CAPS    Take 50,000 Units by mouth every 7 (seven) days.     Review of Systems  Cardiovascular: Negative for chest pain and palpitations.  Neurological: Positive for dizziness and light-headedness. Negative for headaches.    History  Substance Use Topics  . Smoking status: Never Smoker   . Smokeless tobacco: Not on file  . Alcohol Use: No   Objective:   BP 120/80 mmHg  Pulse 76  Temp(Src) 98.6 F (37 C) (Oral)  Resp 16  Wt 177 lb (80.287 kg)  SpO2 96%  Physical Exam  General appearance: alert, well developed, well nourished, cooperative  and in no distress Head: Normocephalic, without obvious abnormality, atraumatic Lungs: Respirations even and unlabored Extremities: No gross deformities Skin: Skin color, texture, turgor normal. No rashes seen  Psych: Appropriate mood and affect. Neurologic: Mental status: Alert, oriented to person, place, and time, thought content appropriate. MS: Marked swelling of right wrist and hand. Unable to flex wrist > 10 degrees due to pain and swelling. Is diffusely tender.          Assessment & Plan:     1. Wrist pain, acute, right Worsening more than a week after initial injury. No fracture on XR, but severe arthritis. Need orthopedic evaluation for possible occult wrist fracture. - AMB referral to orthopedics  2. Wrist swelling, right Advised to keep hand elevated above level of heart. Wrapped with ACE bandage.   3. Fatigue.  Likely secondary to depression as work up has been unremarkable. He had trouble with dizziness with higher doses of citalopram in the past, so I am hesitant to increase further at this time. Consider increasing dose once his wrist has been treated.       [name], MD  [facility]

## 2015-03-19 ENCOUNTER — Encounter: Payer: Medicare Other | Admitting: Physical Therapy

## 2015-03-22 DIAGNOSIS — M25631 Stiffness of right wrist, not elsewhere classified: Secondary | ICD-10-CM | POA: Diagnosis not present

## 2015-03-22 DIAGNOSIS — M79644 Pain in right finger(s): Secondary | ICD-10-CM | POA: Diagnosis not present

## 2015-03-22 DIAGNOSIS — M25441 Effusion, right hand: Secondary | ICD-10-CM | POA: Diagnosis not present

## 2015-03-22 DIAGNOSIS — M25641 Stiffness of right hand, not elsewhere classified: Secondary | ICD-10-CM | POA: Diagnosis not present

## 2015-03-26 ENCOUNTER — Encounter: Payer: Medicare Other | Admitting: Physical Therapy

## 2015-03-27 DIAGNOSIS — M79644 Pain in right finger(s): Secondary | ICD-10-CM | POA: Diagnosis not present

## 2015-03-27 DIAGNOSIS — M25441 Effusion, right hand: Secondary | ICD-10-CM | POA: Diagnosis not present

## 2015-03-27 DIAGNOSIS — M25631 Stiffness of right wrist, not elsewhere classified: Secondary | ICD-10-CM | POA: Diagnosis not present

## 2015-03-27 DIAGNOSIS — M25641 Stiffness of right hand, not elsewhere classified: Secondary | ICD-10-CM | POA: Diagnosis not present

## 2015-03-29 DIAGNOSIS — S60221A Contusion of right hand, initial encounter: Secondary | ICD-10-CM | POA: Diagnosis not present

## 2015-03-29 DIAGNOSIS — M25641 Stiffness of right hand, not elsewhere classified: Secondary | ICD-10-CM | POA: Diagnosis not present

## 2015-03-29 DIAGNOSIS — S60221D Contusion of right hand, subsequent encounter: Secondary | ICD-10-CM | POA: Diagnosis not present

## 2015-03-30 DIAGNOSIS — E875 Hyperkalemia: Secondary | ICD-10-CM | POA: Diagnosis not present

## 2015-03-30 DIAGNOSIS — D631 Anemia in chronic kidney disease: Secondary | ICD-10-CM | POA: Diagnosis not present

## 2015-03-30 DIAGNOSIS — N184 Chronic kidney disease, stage 4 (severe): Secondary | ICD-10-CM | POA: Diagnosis not present

## 2015-03-30 DIAGNOSIS — N189 Chronic kidney disease, unspecified: Secondary | ICD-10-CM | POA: Diagnosis not present

## 2015-03-30 DIAGNOSIS — N2581 Secondary hyperparathyroidism of renal origin: Secondary | ICD-10-CM | POA: Diagnosis not present

## 2015-03-30 DIAGNOSIS — I10 Essential (primary) hypertension: Secondary | ICD-10-CM | POA: Diagnosis not present

## 2015-04-05 ENCOUNTER — Telehealth: Payer: Self-pay | Admitting: Family Medicine

## 2015-04-05 DIAGNOSIS — S60221D Contusion of right hand, subsequent encounter: Secondary | ICD-10-CM | POA: Diagnosis not present

## 2015-04-05 DIAGNOSIS — M25641 Stiffness of right hand, not elsewhere classified: Secondary | ICD-10-CM | POA: Diagnosis not present

## 2015-04-05 NOTE — Telephone Encounter (Signed)
Pt states she seen Dr Burney Gauze in [place] today for gout in his right hand.  Dr Burney Gauze does give prescriptions so the office has sent info to Dr Caryn Section asking for a Rx to be written to help with the gout.  Pt is calling to see if this Rx can be written today? [place]. [phone]

## 2015-04-05 NOTE — Telephone Encounter (Signed)
Please advise 

## 2015-04-06 ENCOUNTER — Encounter: Payer: Self-pay | Admitting: Family Medicine

## 2015-04-06 ENCOUNTER — Ambulatory Visit (INDEPENDENT_AMBULATORY_CARE_PROVIDER_SITE_OTHER): Payer: Medicare Other | Admitting: Family Medicine

## 2015-04-06 VITALS — BP 126/72 | HR 86 | Temp 98.0°F | Resp 24 | Wt 178.0 lb

## 2015-04-06 DIAGNOSIS — M25531 Pain in right wrist: Secondary | ICD-10-CM

## 2015-04-06 DIAGNOSIS — M25431 Effusion, right wrist: Secondary | ICD-10-CM

## 2015-04-06 DIAGNOSIS — I2510 Atherosclerotic heart disease of native coronary artery without angina pectoris: Secondary | ICD-10-CM | POA: Diagnosis not present

## 2015-04-06 MED ORDER — COLCHICINE 0.6 MG PO TABS: ORAL_TABLET | ORAL | Status: DC

## 2015-04-06 NOTE — Telephone Encounter (Signed)
I don't know anything about this. Please call Dr. Bertis Ruddy office and have them send their office notes ASAP. Thanks.

## 2015-04-06 NOTE — Progress Notes (Signed)
Patient: Keith Woods Male    DOB: 06-14-1926   81 y.o.   MRN: [id number] Visit Date: 04/06/2015  Today's Provider: [name], MD   Chief Complaint  Patient presents with  . Hand Pain    x 4 weeks   Subjective:    Hand Pain  Incident onset: 4 weeks ago. The injury mechanism was a fall. The pain is present in the right hand. The quality of the pain is described as aching. Radiates to: right wrist. The pain is severe (8/10). The pain has been constant since the incident. Pertinent negatives include no chest pain, muscle weakness, numbness or tingling. Treatments tried: hand and wrist brace. The treatment provided mild relief.   He has been followed by Dr. Burney Gauze after injuring right wrist last month. He states that it had been healing well with swelling nearly resolved until he work up Monday (04-02-2015) with his entire right hand and wrist extremely swollen and painful. He saw Dr. Burney Gauze yesterday and states he was told he may have gout in his wrist. The patient states he has had gout in his little toe for several months, but has not sought medical attention for this. He states that Dr. Burney Gauze recommended he contact me for colchicine prescription, as patient is not supposed to be taking NSAIDs due to CKD.,     Allergies  Allergen Reactions  . Lipitor [Atorvastatin Calcium]   . Nsaids   . Statins    Previous Medications   AMLODIPINE (NORVASC) 2.5 MG TABLET    Take 2.5 mg by mouth as needed.   CITALOPRAM (CELEXA) 10 MG TABLET    Take 0.5 tablets (5 mg total) by mouth 2 (two) times daily.   COLESEVELAM (WELCHOL) 625 MG TABLET    Take 1,875 mg by mouth daily. 3 pills in the morning and 3 pills at night    DIPYRIDAMOLE-ASPIRIN (AGGRENOX) 25-200 MG PER 12 HR CAPSULE    Take 1 capsule by mouth 2 (two) times daily.     ESOMEPRAZOLE (NEXIUM) 40 MG CAPSULE    Take 40 mg by mouth daily.    FISH OIL-OMEGA-3 FATTY ACIDS 1000 MG CAPSULE    Take 1,200 mg by mouth daily.    FLUTICASONE (FLONASE) 50 MCG/ACT NASAL SPRAY    instill 2 sprays into each nostril once daily   MULTIPLE VITAMIN (MULTIVITAMIN) TABLET    Take 1 tablet by mouth daily.     NITROGLYCERIN (NITROSTAT) 0.4 MG SL TABLET    Place 1 tablet (0.4 mg total) under the tongue every 5 (five) minutes as needed for chest pain.   PROBIOTIC PRODUCT (ALIGN) 4 MG CAPS    Take 4 mg by mouth daily.   SODIUM BICARBONATE 650 MG TABLET    Take 650 mg by mouth 2 (two) times daily.   VITAMIN D, ERGOCALCIFEROL, (DRISDOL) 50000 UNITS CAPS    Take 50,000 Units by mouth every 7 (seven) days.     Review of Systems  Cardiovascular: Negative for chest pain.  Musculoskeletal: Positive for joint swelling. Arthralgias: right wrist.  Neurological: Negative for tingling and numbness.    Social History  Substance Use Topics  . Smoking status: Never Smoker   . Smokeless tobacco: Not on file  . Alcohol Use: No   Objective:   BP 126/72 mmHg  Pulse 86  Temp(Src) 98 F (36.7 C) (Oral)  Resp 24  Wt 178 lb (80.74 kg)  SpO2 98%  Physical Exam  Musc: Very  swollen tight hand, fingers and wrist. Slightly warm to touch over wrist. Unable to flex wrsit more than 15 degrees or extend more than 5.     Assessment & Plan:     1. Wrist pain, acute, right  - colchicine 0.6 MG tablet; 2 tablets today, then 1 daily until gout is resolved.  Dispense: 30 tablet; Refill: 0  2. Wrist swelling, right  - colchicine 0.6 MG tablet; 2 tablets today, then 1 daily until gout is resolved.  Dispense: 30 tablet; Refill: 0  Considering he had near complete recovery from injury a month after falling on wrist, now with sudden, unprovoked swelling of hand and wrist, acute gout is a likely culprit. Will start colchicine. He states that Dr. Juleen China recently did blood work. If he did not check uric acid, then will arrange this next week.       [name], MD  [facility]

## 2015-04-06 NOTE — Telephone Encounter (Signed)
Patient was seen today in the office for evaluation.

## 2015-04-10 ENCOUNTER — Telehealth: Payer: Self-pay | Admitting: *Deleted

## 2015-04-10 DIAGNOSIS — M109 Gout, unspecified: Secondary | ICD-10-CM

## 2015-04-10 NOTE — Telephone Encounter (Signed)
-----   Message from [name], MD sent at 04/10/2015 10:37 AM EDT ----- Regarding: Follow up Gout Please check with patient to see if wrist swelling is better since we prescribed colchicine last week. If so then we need to check his uric acid level to see if we need something to lower Uric Acid.

## 2015-04-10 NOTE — Telephone Encounter (Signed)
LMOVM for pt to return call 

## 2015-04-11 NOTE — Telephone Encounter (Signed)
Patient returned call. Patient stated that he is still having wrist pain and swelling. Labs ordered per Dr. Caryn Section. Patient notified. Patient stated that he will have lab drawn 04/12/2015.

## 2015-04-12 DIAGNOSIS — M10031 Idiopathic gout, right wrist: Secondary | ICD-10-CM | POA: Diagnosis not present

## 2015-04-13 ENCOUNTER — Telehealth: Payer: Self-pay | Admitting: Family Medicine

## 2015-04-13 ENCOUNTER — Other Ambulatory Visit: Payer: Self-pay | Admitting: Family Medicine

## 2015-04-13 LAB — URIC ACID: Uric Acid: 8.4 mg/dL (ref 3.7–8.6)

## 2015-04-13 MED ORDER — ALLOPURINOL 100 MG PO TABS
100.0000 mg | ORAL_TABLET | Freq: Every day | ORAL | Status: DC
Start: 2015-04-13 — End: 2015-07-20

## 2015-04-13 NOTE — Telephone Encounter (Signed)
Patient notified

## 2015-04-13 NOTE — Telephone Encounter (Signed)
Pt is requesting the results from lab work.  KS#[phone]/[id number]

## 2015-04-26 DIAGNOSIS — M25641 Stiffness of right hand, not elsewhere classified: Secondary | ICD-10-CM | POA: Diagnosis not present

## 2015-04-26 DIAGNOSIS — S60221D Contusion of right hand, subsequent encounter: Secondary | ICD-10-CM | POA: Diagnosis not present

## 2015-05-10 ENCOUNTER — Ambulatory Visit: Payer: Medicare Other | Attending: Otolaryngology

## 2015-05-10 ENCOUNTER — Encounter: Payer: Self-pay | Admitting: Physical Therapy

## 2015-05-10 VITALS — BP 149/76 | HR 74

## 2015-05-10 DIAGNOSIS — R29818 Other symptoms and signs involving the nervous system: Secondary | ICD-10-CM | POA: Insufficient documentation

## 2015-05-10 DIAGNOSIS — R269 Unspecified abnormalities of gait and mobility: Secondary | ICD-10-CM | POA: Insufficient documentation

## 2015-05-10 DIAGNOSIS — R2689 Other abnormalities of gait and mobility: Secondary | ICD-10-CM

## 2015-05-10 NOTE — Patient Instructions (Signed)
SIT TO STAND: Feet Narrow   Place feet apart together. Lean chest forward. Raise hips and straighten knees to stand. Keep knees the same distance apart the whole time. Perform 10 reps and then repeat, perform 2 times/day;  Tandem Stance   Right foot in front of left, heel touching toe both feet "straight ahead". Start with feet wider apart and gradually progress to one foot directly in front other other. Balance in this position 30 seconds. Alternate and repeat with the opposite foot forward. Perform 3 times with each leg forward. Perform 2 times/day.  Do with left foot in front of right.  Copyright  VHI. All rights reserved.

## 2015-05-10 NOTE — Therapy (Signed)
[place] MAIN [facility] SERVICES [place], [place], [address] Phone: [phone]   Fax:  [phone]  Physical Therapy Evaluation  Patient Details  Name: Keith Woods MRN: [id number] Date of Birth: 1926-06-23 Referring Tiffiney Sparrow:  Carloyn Manner, MD  Encounter Date: 05/10/2015      PT End of Session - 05/10/15 2355    Visit Number 1   Number of Visits 13   Date for PT Re-Evaluation 08-Jul-2015   Authorization Type g codes every 10th visit/30 days   PT Start Time 0900   PT Stop Time 1000   PT Time Calculation (min) 60 min   Equipment Utilized During Treatment Gait belt   Activity Tolerance Patient tolerated treatment well   Behavior During Therapy [facility] for tasks assessed/performed      Past Medical History  Diagnosis Date  . Syncope and collapse   . Hypertension   . Coronary artery disease     NONOBSTRUCTIVE by cath 2008  . History of transient ischemic attack (TIA)   . GERD (gastroesophageal reflux disease)   . History of prostate cancer   . Hyperlipidemia   . LBBB (left bundle branch block)     Past Surgical History  Procedure Laterality Date  . Cardiac catheterization  08/28/2006    EF 60%  . Tonsillectomy    . Transthoracic echocardiogram  10/10/2008    EF 55%    Filed Vitals:   05/10/15 0908  BP: 149/76  Pulse: 74    Visit Diagnosis:  Balance disorder - Plan: PT plan of care cert/re-cert  Abnormality of gait - Plan: PT plan of care cert/re-cert      Subjective Assessment - 05/10/15 0911    Subjective "Poor balance"   Pertinent History Pt reports he started having dizziness 6 weeks ago. He went to see ENT who performed a VNG and said that his symptoms were not inner ear in nature. Pt states that his dizziness has slowly improved since that time and currently he denies any dizziness. Pt states that he still has trouble with his balance which has been going on for the last 3 months. Pt reports  gradual worsening of his balance without any known onset. Pt reports one fall 6 weeks ago in which he injured his R wrist. Pt has seen MD and radiographs were negative for fractures. Pt states he has had 4 falls in the last 12 months.  He reports worsening of hearing in R ear. Pt also reports numbness on underside of feet which has been progressive. No history of diabetes. Denies changes in vision. Last optometry appointment within 12 months.  ROS negative for red flags   Currently in Pain? No/denies  History of intermittent RLE pain, unknown etiology       TREATMENT: Performed sit to stand 2 x 10, cues to keep knees equal distance apart due to bilateral LE valgus during both phases of transfer. Performed standing balance in NBOS with eyes open/closed followed by horizontal head turns; Static modified tandem balance alternating LE 30 seconds x 2 each progressing toward full tandem stance, pt unable to perform full tandem without LOB within 5 seconds;         Plan - 05/11/15 0823    Clinical Impression Statement Pt is a pleasant 81 year-old male who was referred for dizziness which has since resolved. Pt currently complaining of balance impairments which have led to 4 falls over the last 12 months. PT examination reveals decrease in  LE strength/power as noted with transfers. Poor reaction time and impaired balance with a score of 43/56 on the BERG and 16/24 on DGI. Gait speed is functional fo rlimited community ambulation. Overall, patient's report of his balance confidence on ABC survey is 83.13% which exceeds his performance on balance testing likely encouraging patient to take risks which exceed his true ability to maintain balance. Pt would benefit from skilled PT services to address deficits listed above in order to improve balance, increase strength, decrease risk of falls, and restore improved function at home.    Pt will benefit from skilled therapeutic intervention in order to improve on the  following deficits Abnormal gait;Decreased balance;Difficulty walking   Rehab Potential Good   Clinical Impairments Affecting Rehab Potential Positive: motivation; Negative: age   PT Frequency 2x / week   PT Duration 6 weeks   PT Treatment/Interventions Canalith Repostioning;Aquatic Therapy;Electrical Stimulation;Ultrasound;DME Instruction;Gait Scientist, forensic;Therapeutic activities;Therapeutic exercise;Balance training;Neuromuscular re-education;Patient/family education;Manual techniques;Vestibular   PT Next Visit Plan Progress LE strengthening and balance, advance HEP   PT Home Exercise Plan Static modified tandem progressing to tandem balance; sit to stand   Consulted and Agree with Plan of Care Patient          G-Codes - 05/21/15 0837    Functional Assessment Tool Used ABC, DGI, BERG, TUG, 5TSTS, 78m gait speed   Functional Limitation Mobility: Walking and moving around   Mobility: Walking and Moving Around Current Status [phone]) At least 20 percent but less than 40 percent impaired, limited or restricted   Mobility: Walking and Moving Around Goal Status [id number]) At least 1 percent but less than 20 percent impaired, limited or restricted       Problem List Patient Active Problem List   Diagnosis Date Noted  . Wrist pain 03/15/2015  . Chronic kidney disease 02/02/2015  . Vertigo 02/02/2015  . Hearing loss 02/02/2015  . H/O malignant neoplasm of prostate 12/06/2012  . LBBB (left bundle branch block)   . Hypertension   . Coronary artery disease   . Hyperlipidemia    Phillips Grout PT, DPT   Huprich,Jason 2015/05/21, 8:44 AM  [place] MAIN [facility] SERVICES [address] [place], [place], [address] Phone: [phone]   Fax:  [phone]

## 2015-05-15 ENCOUNTER — Ambulatory Visit: Payer: Medicare Other | Attending: Otolaryngology

## 2015-05-15 DIAGNOSIS — R29818 Other symptoms and signs involving the nervous system: Secondary | ICD-10-CM | POA: Diagnosis not present

## 2015-05-15 DIAGNOSIS — R269 Unspecified abnormalities of gait and mobility: Secondary | ICD-10-CM

## 2015-05-15 DIAGNOSIS — R2689 Other abnormalities of gait and mobility: Secondary | ICD-10-CM

## 2015-05-16 NOTE — Patient Instructions (Signed)
[email or web address] Supine clamshells with red band 2x10 Quad set x10 with 3 sec hold SLR 2x10 each LE

## 2015-05-16 NOTE — Therapy (Signed)
[place] MAIN [facility] SERVICES [address] [place], [place], [address] Phone: [phone]   Fax:  [phone]  Physical Therapy Treatment  Patient Details  Name: Keith Woods MRN: [id number] Date of Birth: 06-20-1926 Referring Provider:  Carloyn Manner, MD  Encounter Date: 05/15/2015      PT End of Session - 05/16/15 0845    Visit Number 2   Number of Visits 13   Date for PT Re-Evaluation 06/22/15   Authorization Type 2/10 g codes   PT Start Time 0930   PT Stop Time 1015   PT Time Calculation (min) 45 min   Equipment Utilized During Treatment Gait belt   Activity Tolerance Patient tolerated treatment well   Behavior During Therapy [facility] for tasks assessed/performed      Past Medical History  Diagnosis Date  . Syncope and collapse   . Hypertension   . Coronary artery disease     NONOBSTRUCTIVE by cath 2008  . History of transient ischemic attack (TIA)   . GERD (gastroesophageal reflux disease)   . History of prostate cancer   . Hyperlipidemia   . LBBB (left bundle branch block)     Past Surgical History  Procedure Laterality Date  . Cardiac catheterization  08/28/2006    EF 60%  . Tonsillectomy    . Transthoracic echocardiogram  10/10/2008    EF 55%    There were no vitals filed for this visit.  Visit Diagnosis:  Balance disorder  Abnormality of gait      Subjective Assessment - 05/16/15 0844    Subjective Pt relates he has been doing well except no improvement with his right LE pain.  He currently has 1-2/10 pain and notes pain will increase during sit to stand transfers and walking.  Pt "limped" walking in from the parking lot.  His right leg pain increases with extended walking.     Pertinent History Pt reports he started having dizziness 6 weeks ago. He went to see ENT who performed a VNG and said that his symptoms were not inner ear in nature. Pt states that his dizziness has slowly improved since that time  and currently he denies any dizziness. Pt states that he still has trouble with his balance which has been going on for the last 3 months. Pt reports gradual worsening of his balance without any known onset. Pt reports one fall 6 weeks ago in which he injured his R wrist. Pt has seen MD and radiographs were negative for fractures. Pt states he has had 4 falls in the last 12 months.  He reports worsening of hearing in R ear. Pt also reports numbness on underside of feet which has been progressive. No history of diabetes. Denies changes in vision. Last optometry appointment within 12 months.  ROS negative for red flags   Currently in Pain? Yes   Pain Score 2    Pain Location Leg   Pain Orientation Right         There ex: Nustep x 4 min: no charge Supine quad sets x10 with 3 sec hold each LE Supine quad set with 3 sec hold + SLR 2x10 each LE Supine clamshells with red band 2x10 each LE Bridges x5, discontinued to increase in LBP Sit to stand x2 Standing hip extension with red band x10 each LE Bilateral leg press with 60# 2x10 Marching in place x10 LE, requires UE assist to maintain balance Pt required verbal and tactile cues for correct  exercise technique                         PT Education - 05/16/15 0845    Education provided Yes   Education Details plan of care and new exercises for HEP   Person(s) Educated Patient   Methods Explanation   Comprehension Verbalized understanding             PT Long Term Goals - 05/11/15 0829    PT LONG TERM GOAL #1   Title Pt will demonstrate increase in BERG by at least 5 points in order to reveal improved balance and decreased risk for falls   Baseline 05/10/15: 43/56   Status New   PT LONG TERM GOAL #2   Title Pt will demonstrate improvement in DGI by at least 3 points in order to reveal improved ability to perform quick body turns and head turns with ambulation to decrease fall risk    Baseline 05/10/15: 16/24    Status New   PT LONG TERM GOAL #3   Title Pt will be independent with HEP for balance and strength in order to continue improvement after formalized physical therapy and decrease risk for future falls   Status New   PT LONG TERM GOAL #4   Title Pt will demonstrate consistent and safe use of single point cane over a variety of surfaces to improve safety and decrease risk for falls.    Baseline 05/10/15: does not use assistive device for ambulation   Status New               Plan - 05/16/15 0847    Clinical Impression Statement Pt did will today and did not experience in increase in R LE pain but did note LBP with bridges.  Pt displays weak quad set bilaterally and difficulty maintaining knees extended during SLR.  Pt displays decreased LE strength and would benefit from LE strengthening and neuromuscular control to decrease risk of falls.    Pt will benefit from skilled therapeutic intervention in order to improve on the following deficits Abnormal gait;Decreased balance;Difficulty walking   Rehab Potential Good   Clinical Impairments Affecting Rehab Potential Positive: motivation; Negative: age   PT Frequency 2x / week   PT Duration 6 weeks   PT Treatment/Interventions Canalith Repostioning;Aquatic Therapy;Electrical Stimulation;Ultrasound;DME Instruction;Gait Scientist, forensic;Therapeutic activities;Therapeutic exercise;Balance training;Neuromuscular re-education;Patient/family education;Manual techniques;Vestibular   PT Next Visit Plan Progress LE strengthening and balance, advance HEP   Consulted and Agree with Plan of Care Patient        Problem List Patient Active Problem List   Diagnosis Date Noted  . Wrist pain 03/15/2015  . Chronic kidney disease 02/02/2015  . Vertigo 02/02/2015  . Hearing loss 02/02/2015  . H/O malignant neoplasm of prostate 12/06/2012  . LBBB (left bundle branch block)   . Hypertension   . Coronary artery disease   . Hyperlipidemia     Renford Dills, SPT This entire session was performed under direct supervision and direction of a licensed therapist/therapist assistant . I have personally read, edited and approve of the note as written. Gorden Harms. Tortorici, PT, DPT [phone]  Tortorici,Ashley 05/16/2015, 10:28 AM  [place] MAIN [facility] SERVICES [address] [place], [place], [address] Phone: [phone]   Fax:  [phone]

## 2015-05-18 ENCOUNTER — Ambulatory Visit (INDEPENDENT_AMBULATORY_CARE_PROVIDER_SITE_OTHER): Payer: Medicare Other | Admitting: Family Medicine

## 2015-05-18 ENCOUNTER — Telehealth: Payer: Self-pay | Admitting: *Deleted

## 2015-05-18 ENCOUNTER — Encounter: Payer: Self-pay | Admitting: Family Medicine

## 2015-05-18 ENCOUNTER — Ambulatory Visit
Admission: RE | Admit: 2015-05-18 | Discharge: 2015-05-18 | Disposition: A | Discharge status: Home / Self Care | Payer: Medicare Other | Source: Ambulatory Visit | Attending: Family Medicine | Admitting: Family Medicine

## 2015-05-18 VITALS — BP 124/70 | HR 72 | Temp 97.6°F | Resp 16 | Wt 180.0 lb

## 2015-05-18 DIAGNOSIS — Z23 Encounter for immunization: Secondary | ICD-10-CM

## 2015-05-18 DIAGNOSIS — M79604 Pain in right leg: Secondary | ICD-10-CM | POA: Diagnosis not present

## 2015-05-18 DIAGNOSIS — M1611 Unilateral primary osteoarthritis, right hip: Secondary | ICD-10-CM | POA: Diagnosis not present

## 2015-05-18 DIAGNOSIS — M10031 Idiopathic gout, right wrist: Secondary | ICD-10-CM | POA: Diagnosis not present

## 2015-05-18 DIAGNOSIS — I10 Essential (primary) hypertension: Secondary | ICD-10-CM | POA: Diagnosis not present

## 2015-05-18 DIAGNOSIS — I2510 Atherosclerotic heart disease of native coronary artery without angina pectoris: Secondary | ICD-10-CM

## 2015-05-18 DIAGNOSIS — M25551 Pain in right hip: Secondary | ICD-10-CM | POA: Diagnosis not present

## 2015-05-18 DIAGNOSIS — M109 Gout, unspecified: Secondary | ICD-10-CM

## 2015-05-18 NOTE — Telephone Encounter (Signed)
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 There is mild arthritis in his left hip. Pain in his right leg is due to referred pain from arthritis in his right hip. Physical therapy may help somewhat for this. Only medication to take is Tylenol. Sometimes orthopedist will do cortisone injections in the hip if pain is severe. Let me know if he would like referral to orthopedist.

## 2015-05-18 NOTE — Progress Notes (Signed)
Patient: Keith Woods Male    DOB: June 04, 1926   81 y.o.   MRN: [id number] Visit Date: 05/18/2015  Today's Provider: [name], MD   Chief Complaint  Patient presents with  . Follow-up  . Wrist Pain   Subjective:    HPI  Follow-up for right wrist pain from 04/06/2015; after evaluation by Dr. Burney Gauze who felt he had gout. Had mildly elevated uric acid and was started on colchicine 0.6 mg x1qd and allopurinol 100 mg x1 qd. He reports wrist pain has since resolved and is no longer swollen.   Leg pain Complains of long history of intermittent pain his right upper leg, worse when he stands up and when ambulating. Has had no injury. No radiation into lower leg.   Allergies  Allergen Reactions  . Lipitor [Atorvastatin Calcium]   . Nsaids   . Statins    Previous Medications   ALLOPURINOL (ZYLOPRIM) 100 MG TABLET    Take 1 tablet (100 mg total) by mouth daily.   AMLODIPINE (NORVASC) 2.5 MG TABLET    Take 2.5 mg by mouth as needed.   CITALOPRAM (CELEXA) 10 MG TABLET    Take 0.5 tablets (5 mg total) by mouth 2 (two) times daily.   COLESEVELAM (WELCHOL) 625 MG TABLET    Take 1,875 mg by mouth daily. 3 pills in the morning and 3 pills at night    DIPYRIDAMOLE-ASPIRIN (AGGRENOX) 25-200 MG PER 12 HR CAPSULE    Take 1 capsule by mouth 2 (two) times daily.     ESOMEPRAZOLE (NEXIUM) 40 MG CAPSULE    Take 40 mg by mouth daily.    FISH OIL-OMEGA-3 FATTY ACIDS 1000 MG CAPSULE    Take 1,200 mg by mouth daily.    FLUTICASONE (FLONASE) 50 MCG/ACT NASAL SPRAY    instill 2 sprays into each nostril once daily   MULTIPLE VITAMIN (MULTIVITAMIN) TABLET    Take 1 tablet by mouth daily.     NITROGLYCERIN (NITROSTAT) 0.4 MG SL TABLET    Place 1 tablet (0.4 mg total) under the tongue every 5 (five) minutes as needed for chest pain.   PROBIOTIC PRODUCT (ALIGN) 4 MG CAPS    Take 4 mg by mouth daily.   SODIUM BICARBONATE 650 MG TABLET    Take 650 mg by mouth 2 (two) times daily.   VITAMIN D,  ERGOCALCIFEROL, (DRISDOL) 50000 UNITS CAPS    Take 50,000 Units by mouth every 7 (seven) days.     Review of Systems  Cardiovascular: Negative for chest pain and palpitations.  Musculoskeletal: Positive for gait problem.  Neurological: Negative for dizziness, light-headedness and headaches.    Social History  Substance Use Topics  . Smoking status: Never Smoker   . Smokeless tobacco: Not on file  . Alcohol Use: No   Objective:   BP 124/70 mmHg  Pulse 72  Temp(Src) 97.6 F (36.4 C) (Oral)  Resp 16  Wt 180 lb (81.647 kg)  SpO2 97%     Depression screen PHQ 2/9 05/18/2015  Decreased Interest 0  Down, Depressed, Hopeless 0  PHQ - 2 Score 0  Altered sleeping 1  Tired, decreased energy 2  Change in appetite 0  Feeling bad or failure about yourself  0  Trouble concentrating 0  Moving slowly or fidgety/restless 0  Suicidal thoughts 0  PHQ-9 Score 3  Difficult doing work/chores Not difficult at all    Fall Risk  05/18/2015  Falls in the past year?  Yes  Number falls in past yr: 2 or more  Injury with Fall? Yes  Risk for fall due to : History of fall(s)     Physical Exam  General appearance: alert, well developed, well nourished, cooperative and in no distress Head: Normocephalic, without obvious abnormality, atraumatic Lungs: Respirations even and unlabored Extremities: No gross deformities Skin: Skin color, texture, turgor normal. No rashes seen  Psych: Appropriate mood and affect. Neurologic: Mental status: Alert, oriented to person, place, and time, thought content appropriate. MS: Minimal right wrist tenderness, full range of motion. Minimal swelling. No erythema.  No leg tenderness.     Assessment & Plan:     1. Acute gout of right wrist, unspecified cause Resolved since completed course of colchicine and doing well on allopurinol which he to continue for now.   2. Right leg pain  - DG FEMUR, MIN 2 VIEWS RIGHT; Future - DG HIP UNILAT WITH PELVIS 2-3 VIEWS  RIGHT; Future  3. Essential hypertension Stable on current medications.   4. Need for Tdap vaccination  - Tdap vaccine greater than or equal to 7yo IM  5. Need for influenza vaccination  - Flu vaccine HIGH DOSE PF  6. Need for pneumococcal vaccination  - Pneumococcal conjugate vaccine 13-valent IM       [name], MD  [facility]

## 2015-05-18 NOTE — Telephone Encounter (Signed)
Patient notified of results. Patient expressed understanding. Patient stated that he is already in PT and he will let us know if he wants to be referred to orthopedist.

## 2015-05-21 ENCOUNTER — Ambulatory Visit: Payer: Medicare Other

## 2015-05-21 DIAGNOSIS — R269 Unspecified abnormalities of gait and mobility: Secondary | ICD-10-CM

## 2015-05-21 DIAGNOSIS — R2689 Other abnormalities of gait and mobility: Secondary | ICD-10-CM

## 2015-05-21 DIAGNOSIS — R29818 Other symptoms and signs involving the nervous system: Secondary | ICD-10-CM | POA: Diagnosis not present

## 2015-05-22 NOTE — Therapy (Signed)
[place] MAIN [facility] SERVICES [address] [place], [place], [address] Phone: [phone]   Fax:  [phone]  Physical Therapy Treatment  Patient Details  Name: Keith Woods MRN: [id number] Date of Birth: November 29, 1925 Referring Provider:  Carloyn Manner, MD  Encounter Date: 05/21/2015      PT End of Session - 05/22/15 0905    Visit Number 3   Number of Visits 13   Date for PT Re-Evaluation 06/22/15   Authorization Type 3/10 g codes   PT Start Time 1025   PT Stop Time 1430   PT Time Calculation (min) 45 min   Equipment Utilized During Treatment Gait belt   Activity Tolerance Patient tolerated treatment well   Behavior During Therapy [facility] for tasks assessed/performed      Past Medical History  Diagnosis Date  . Syncope and collapse   . Hypertension   . Coronary artery disease     NONOBSTRUCTIVE by cath 2008  . History of transient ischemic attack (TIA)   . GERD (gastroesophageal reflux disease)   . History of prostate cancer   . Hyperlipidemia   . LBBB (left bundle branch block)     Past Surgical History  Procedure Laterality Date  . Cardiac catheterization  08/28/2006    EF 60%  . Tonsillectomy    . Transthoracic echocardiogram  10/10/2008    EF 55%    There were no vitals filed for this visit.  Visit Diagnosis:  Balance disorder  Abnormality of gait      Subjective Assessment - 05/22/15 0904    Subjective Pt relates he is having less right LE pain and denies any pain currently.  pt has been compliant with HEP and notes supine clamshells increased right LE pain thus did not perform as frequently.     Pertinent History Pt reports he started having dizziness 6 weeks ago. He went to see ENT who performed a VNG and said that his symptoms were not inner ear in nature. Pt states that his dizziness has slowly improved since that time and currently he denies any dizziness. Pt states that he still has trouble with his  balance which has been going on for the last 3 months. Pt reports gradual worsening of his balance without any known onset. Pt reports one fall 6 weeks ago in which he injured his R wrist. Pt has seen MD and radiographs were negative for fractures. Pt states he has had 4 falls in the last 12 months.  He reports worsening of hearing in R ear. Pt also reports numbness on underside of feet which has been progressive. No history of diabetes. Denies changes in vision. Last optometry appointment within 12 months.  ROS negative for red flags   Currently in Pain? No/denies   Pain Score 0-No pain         There ex: Nustep x 4 min: no charge Supine quad sets x10 with 3 sec hold each LE Supine quad set with 3 sec hold + SLR 2x10 each LE, pt required verbal cues to maintain knee extended, due to losing quad control as he lowered his LEs Supine clamshells with red band x10, pt experienced right lateral hip pain  Standing hip extension with red band x10 each LE Standing hip abduction with red band x5 each LE, pain with right groin/anterior hip pain Standing hip abduction with no band x5, with right groin/anterior hip pain  Pt required verbal and tactile cues for correct exercise technique  Manual therapy: Pt experienced same groin and hip pain with sidelying passive hip extension and supine hip abduction Right hip PROM pain at end range hip flexion, internal rotation and with hip posterior glide Pt reported decreased hip pain with long axis distraction                         PT Education - 05/22/15 0905    Education provided Yes   Education Details plan of care   Person(s) Educated Patient   Methods Explanation   Comprehension Verbalized understanding             PT Long Term Goals - 05/11/15 0829    PT LONG TERM GOAL #1   Title Pt will demonstrate increase in BERG by at least 5 points in order to reveal improved balance and decreased risk for falls   Baseline  05/10/15: 43/56   Status New   PT LONG TERM GOAL #2   Title Pt will demonstrate improvement in DGI by at least 3 points in order to reveal improved ability to perform quick body turns and head turns with ambulation to decrease fall risk    Baseline 05/10/15: 16/24   Status New   PT LONG TERM GOAL #3   Title Pt will be independent with HEP for balance and strength in order to continue improvement after formalized physical therapy and decrease risk for future falls   Status New   PT LONG TERM GOAL #4   Title Pt will demonstrate consistent and safe use of single point cane over a variety of surfaces to improve safety and decrease risk for falls.    Baseline 05/10/15: does not use assistive device for ambulation   Status New               Plan - 05/22/15 0906    Clinical Impression Statement Pt presents with improved quad control but still requires verbal cueing to maintain knee extended through SLR.  Pt presents with limited right hip internal rotation, hip extension and hip abduction to pain.  Pt presents with tight hip flexors, ambulates with minimal hip extension and forward trunk lean and would benefit from stretches and hip mobilizations to improve hip ROM and mobility    Pt will benefit from skilled therapeutic intervention in order to improve on the following deficits Abnormal gait;Decreased balance;Difficulty walking   Rehab Potential Good   Clinical Impairments Affecting Rehab Potential Positive: motivation; Negative: age   PT Frequency 2x / week   PT Duration 6 weeks   PT Treatment/Interventions Canalith Repostioning;Aquatic Therapy;Electrical Stimulation;Ultrasound;DME Instruction;Gait Scientist, forensic;Therapeutic activities;Therapeutic exercise;Balance training;Neuromuscular re-education;Patient/family education;Manual techniques;Vestibular   PT Next Visit Plan Progress LE strengthening and balance, advance HEP   Consulted and Agree with Plan of Care Patient         Problem List Patient Active Problem List   Diagnosis Date Noted  . Right leg pain 05/18/2015  . Acute gout 05/18/2015  . Wrist pain 03/15/2015  . Chronic kidney disease 02/02/2015  . Vertigo 02/02/2015  . Hearing loss 02/02/2015  . H/O malignant neoplasm of prostate 12/06/2012  . LBBB (left bundle branch block)   . Hypertension   . Coronary artery disease   . Hyperlipidemia    Renford Dills, SPT This entire session was performed under direct supervision and direction of a licensed therapist/therapist assistant . I have personally read, edited and approve of the note as written. Gorden Harms. Tortorici, PT, DPT [phone]  Tortorici,Ashley  05/22/2015, 11:04 AM  [place] MAIN [facility] SERVICES [address] [place], [place], [address] Phone: [phone]   Fax:  [phone]

## 2015-05-23 ENCOUNTER — Ambulatory Visit: Payer: Medicare Other

## 2015-05-23 DIAGNOSIS — R269 Unspecified abnormalities of gait and mobility: Secondary | ICD-10-CM

## 2015-05-23 DIAGNOSIS — R2689 Other abnormalities of gait and mobility: Secondary | ICD-10-CM

## 2015-05-24 NOTE — Therapy (Signed)
[place] MAIN [facility] SERVICES [address] [place], [place], [address] Phone: [phone]   Fax:  [phone]  Physical Therapy Treatment  Patient Details  Name: Keith Woods MRN: [id number] Date of Birth: 11-30-1925 Referring Provider:  Carloyn Manner, MD  Encounter Date: 05/23/2015      PT End of Session - 05/24/15 0827    Visit Number 4   Number of Visits 13   Date for PT Re-Evaluation 06/22/15   Authorization Type 4/10 g codes   PT Start Time 1100   PT Stop Time 1145   PT Time Calculation (min) 45 min   Equipment Utilized During Treatment Gait belt   Activity Tolerance Patient limited by pain   Behavior During Therapy [facility] for tasks assessed/performed      Past Medical History  Diagnosis Date  . Syncope and collapse   . Hypertension   . Coronary artery disease     NONOBSTRUCTIVE by cath 2008  . History of transient ischemic attack (TIA)   . GERD (gastroesophageal reflux disease)   . History of prostate cancer   . Hyperlipidemia   . LBBB (left bundle branch block)     Past Surgical History  Procedure Laterality Date  . Cardiac catheterization  08/28/2006    EF 60%  . Tonsillectomy    . Transthoracic echocardiogram  10/10/2008    EF 55%    There were no vitals filed for this visit.  Visit Diagnosis:  Balance disorder  Abnormality of gait      Subjective Assessment - 05/24/15 0826    Subjective Pt reports he starting having "heavy dull pain" in his left buttocks this morning around 4 am, which has now traveled to the lateral thigh and anterior thigh.  Pt's pain increases when he stands and walks.  Pt denies any changes in bowel and bladder, night sweats, or sudden weakness in his LEs.  Pt reports his pain is >5 when he walks and denies any pain when sitting.  Pt reports his back pain is about the same.   Pertinent History Pt reports he started having dizziness 6 weeks ago. He went to see ENT who performed a  VNG and said that his symptoms were not inner ear in nature. Pt states that his dizziness has slowly improved since that time and currently he denies any dizziness. Pt states that he still has trouble with his balance which has been going on for the last 3 months. Pt reports gradual worsening of his balance without any known onset. Pt reports one fall 6 weeks ago in which he injured his R wrist. Pt has seen MD and radiographs were negative for fractures. Pt states he has had 4 falls in the last 12 months.  He reports worsening of hearing in R ear. Pt also reports numbness on underside of feet which has been progressive. No history of diabetes. Denies changes in vision. Last optometry appointment within 12 months.  ROS negative for red flags   Currently in Pain? No/denies   Pain Score 0-No pain     no charge for lumbar screen today as pt was unable to participate in therapy for current dx due to pain.    Educated pt on how to perform log roll to decreases pain during bed mobility Repeated lumbar extension in standing 2x10 Pt reported decreased L LE pain after lumbar extensions and was able to ambulate with improved balance.   Pt educated on use of lumbar roll when  sitting to decrease slump sitting at home.                           PT Education - 05/24/15 0826    Education provided Yes   Education Details plan of care, use of lumbar roll and repeated standing extension    Person(s) Educated Patient   Methods Explanation   Comprehension Verbalized understanding             PT Long Term Goals - 05/11/15 0829    PT LONG TERM GOAL #1   Title Pt will demonstrate increase in BERG by at least 5 points in order to reveal improved balance and decreased risk for falls   Baseline 05/10/15: 43/56   Status New   PT LONG TERM GOAL #2   Title Pt will demonstrate improvement in DGI by at least 3 points in order to reveal improved ability to perform quick body turns and head turns  with ambulation to decrease fall risk    Baseline 05/10/15: 16/24   Status New   PT LONG TERM GOAL #3   Title Pt will be independent with HEP for balance and strength in order to continue improvement after formalized physical therapy and decrease risk for future falls   Status New   PT LONG TERM GOAL #4   Title Pt will demonstrate consistent and safe use of single point cane over a variety of surfaces to improve safety and decrease risk for falls.    Baseline 05/10/15: does not use assistive device for ambulation   Status New               Plan - 05/24/15 0829    Clinical Impression Statement Pt was provided free screen today due to sudden L LE pain.  Pt responded well to repeated lumbar extensions with decreased pain during gait and standing, suggesting lumbar derangement with extension preference.  Pt was informed if LE pain persists and/or worsens by his next visit, he be referred back to his MD as it will interfere with the current treatment plan.     Pt will benefit from skilled therapeutic intervention in order to improve on the following deficits Abnormal gait;Decreased balance;Difficulty walking   Rehab Potential Good   Clinical Impairments Affecting Rehab Potential Positive: motivation; Negative: age   PT Frequency 2x / week   PT Duration 6 weeks   PT Treatment/Interventions Canalith Repostioning;Aquatic Therapy;Electrical Stimulation;Ultrasound;DME Instruction;Gait Scientist, forensic;Therapeutic activities;Therapeutic exercise;Balance training;Neuromuscular re-education;Patient/family education;Manual techniques;Vestibular   PT Next Visit Plan Progress LE strengthening and balance, advance HEP   Consulted and Agree with Plan of Care Patient        Problem List Patient Active Problem List   Diagnosis Date Noted  . Right leg pain 05/18/2015  . Acute gout 05/18/2015  . Wrist pain 03/15/2015  . Chronic kidney disease 02/02/2015  . Vertigo 02/02/2015  . Hearing loss  02/02/2015  . H/O malignant neoplasm of prostate 12/06/2012  . LBBB (left bundle branch block)   . Hypertension   . Coronary artery disease   . Hyperlipidemia    Renford Dills, SPT This entire session was performed under direct supervision and direction of a licensed therapist/therapist assistant . I have personally read, edited and approve of the note as written. Gorden Harms. Tortorici, PT, DPT [phone]  Tortorici,Ashley 05/24/2015, 1:09 PM  [place] MAIN [facility] SERVICES [address] [place], [place], [address] Phone: [phone]  Fax:  [phone]

## 2015-05-28 ENCOUNTER — Ambulatory Visit (INDEPENDENT_AMBULATORY_CARE_PROVIDER_SITE_OTHER): Payer: Medicare Other | Admitting: Family Medicine

## 2015-05-28 ENCOUNTER — Encounter: Payer: Self-pay | Admitting: Family Medicine

## 2015-05-28 VITALS — BP 136/76 | HR 91 | Temp 98.5°F | Resp 20 | Wt 181.0 lb

## 2015-05-28 DIAGNOSIS — M109 Gout, unspecified: Secondary | ICD-10-CM

## 2015-05-28 DIAGNOSIS — M10071 Idiopathic gout, right ankle and foot: Secondary | ICD-10-CM | POA: Diagnosis not present

## 2015-05-28 DIAGNOSIS — M25431 Effusion, right wrist: Secondary | ICD-10-CM | POA: Diagnosis not present

## 2015-05-28 DIAGNOSIS — L02619 Cutaneous abscess of unspecified foot: Secondary | ICD-10-CM

## 2015-05-28 DIAGNOSIS — M25531 Pain in right wrist: Secondary | ICD-10-CM

## 2015-05-28 DIAGNOSIS — L03119 Cellulitis of unspecified part of limb: Secondary | ICD-10-CM

## 2015-05-28 DIAGNOSIS — I2510 Atherosclerotic heart disease of native coronary artery without angina pectoris: Secondary | ICD-10-CM

## 2015-05-28 DIAGNOSIS — M79605 Pain in left leg: Secondary | ICD-10-CM | POA: Diagnosis not present

## 2015-05-28 MED ORDER — COLCHICINE 0.6 MG PO CAPS: ORAL_CAPSULE | ORAL | Status: DC

## 2015-05-28 MED ORDER — CEPHALEXIN 500 MG PO CAPS: 500.0000 mg | ORAL_CAPSULE | Freq: Four times a day (QID) | ORAL | Status: AC

## 2015-05-28 MED ORDER — KETOROLAC TROMETHAMINE 30 MG/ML IJ SOLN
30.0000 mg | Freq: Once | INTRAMUSCULAR | Status: AC
  Administered 2015-05-28: 30 mg via INTRAMUSCULAR

## 2015-05-28 NOTE — Progress Notes (Signed)
Patient: Keith Woods Male    DOB: August 31, 1925   81 y.o.   MRN: [id number] Visit Date: 05/28/2015  Today's Provider: [name], MD   Chief Complaint  Patient presents with  . Leg Pain    x 1 month   Subjective:    Leg Pain  The pain is present in the left leg, left hip and right leg. Quality: sharp. The pain is at a severity of 5/10. The pain is moderate. The pain has been worsening since onset. Associated symptoms include an inability to bear weight, muscle weakness and numbness (on the bottom of his feet). Exacerbated by: walking or turning over in bed.  Patient states he is  having difficulty keeping his balance when walking. Patient states he started walking with a cane 1 week ago to help with balance. He was seen last week with right hip pain with findings of severe arthritis on Xray of right hip, and mild arthritis on left. Since then right leg has stopped hurting, but now having persistent pain and swelling in right ankle and severe pain in right leg whenever he attempts to stand up. Pain mostly resolved when he sits down unless he moves his hip in certain ways.  He was treated for gout in his right hand in August which responded quickly to colchicine, which he completed sometime in the last couple of weeks.    He also notice red spot on the top of his right foot today.   Dg Hip Unilat With Pelvis 2-3 Views Right  05/18/2015  CLINICAL DATA:  Right hip and thigh pain for 4 months. Hit gives out sometimes. No known injury. EXAM: DG HIP (WITH OR WITHOUT PELVIS) 2-3V RIGHT COMPARISON:  None. FINDINGS: No fracture or dislocation. There are advanced arthropathic changes of the right hip with joint space narrowing, most prominent along superior lateral aspect, but also seen diffusely, with bony prominence from the superior lateral acetabulum and superior acetabular subchondral sclerosis, marginal osteophytes from the base of the right femoral head. There is mild narrowing of  the superior lateral left hip joint space compartment with no other arthropathic change on the left. No bone lesion.  Bones are mildly demineralized. Soft tissues are unremarkable. IMPRESSION: Advanced arthropathic changes of the right hip. No fracture or acute finding. Electronically Signed   By: Lajean Manes M.D.   On: 05/18/2015 12:42   Dg Femur, Min 2 Views Right  05/18/2015  CLINICAL DATA:  81 year old male with 4 month history of right thigh pain EXAM: RIGHT FEMUR 2 VIEWS COMPARISON:  Concurrently obtained radiographs of the right hip FINDINGS: Is degenerative osteoarthritis in the right hip joint with significant narrowing of the joint space and near bone-on-bone contact. Bony mineralization appears to be within normal limits. There is no lytic or blastic osseous lesion. No evidence of periosteal reaction. No joint effusion. The visualized portion of the knee joint is unremarkable. IMPRESSION: 1. No acute osseous abnormality or lesion. 2. Moderate right hip joint osteoarthritis. Electronically Signed   By: Jacqulynn Cadet M.D.   On: 05/18/2015 12:43       Allergies  Allergen Reactions  . Lipitor [Atorvastatin Calcium]   . Nsaids   . Statins    Previous Medications   ALLOPURINOL (ZYLOPRIM) 100 MG TABLET    Take 1 tablet (100 mg total) by mouth daily.   AMLODIPINE (NORVASC) 2.5 MG TABLET    Take 2.5 mg by mouth as needed.   CITALOPRAM (CELEXA)  10 MG TABLET    Take 0.5 tablets (5 mg total) by mouth 2 (two) times daily.   COLESEVELAM (WELCHOL) 625 MG TABLET    Take 1,875 mg by mouth daily. 3 pills in the morning and 3 pills at night    DIPYRIDAMOLE-ASPIRIN (AGGRENOX) 25-200 MG PER 12 HR CAPSULE    Take 1 capsule by mouth 2 (two) times daily.     ESOMEPRAZOLE (NEXIUM) 40 MG CAPSULE    Take 40 mg by mouth daily.    FISH OIL-OMEGA-3 FATTY ACIDS 1000 MG CAPSULE    Take 1,200 mg by mouth daily.    FLUTICASONE (FLONASE) 50 MCG/ACT NASAL SPRAY    instill 2 sprays into each nostril once daily    MULTIPLE VITAMIN (MULTIVITAMIN) TABLET    Take 1 tablet by mouth daily.     NITROGLYCERIN (NITROSTAT) 0.4 MG SL TABLET    Place 1 tablet (0.4 mg total) under the tongue every 5 (five) minutes as needed for chest pain.   PROBIOTIC PRODUCT (ALIGN) 4 MG CAPS    Take 4 mg by mouth daily.   SODIUM BICARBONATE 650 MG TABLET    Take 650 mg by mouth 2 (two) times daily.   VITAMIN D, ERGOCALCIFEROL, (DRISDOL) 50000 UNITS CAPS    Take 50,000 Units by mouth every 7 (seven) days.     Review of Systems  Constitutional: Positive for fatigue. Negative for fever, chills and appetite change.  Respiratory: Negative for chest tightness, shortness of breath and wheezing.   Cardiovascular: Negative for chest pain, palpitations and leg swelling.  Gastrointestinal: Negative for nausea, vomiting and abdominal pain.  Musculoskeletal: Positive for myalgias, joint swelling (in both feet), arthralgias and gait problem.  Neurological: Positive for weakness, light-headedness (off balance) and numbness (on the bottom of his feet). Negative for dizziness and headaches.    Social History  Substance Use Topics  . Smoking status: Never Smoker   . Smokeless tobacco: Not on file  . Alcohol Use: No   Objective:   BP 136/76 mmHg  Pulse 91  Temp(Src) 98.5 F (36.9 C) (Oral)  Resp 20  Wt 181 lb (82.101 kg)  SpO2 98%  Physical Exam   Very tender swollen, but non-erythematous right ankle. No tenderness of left lateral hip or trochanter. Pain reproduced with external hip rotation and standing.  About 2cm area of erythema dorsal aspect of right mid foot.    Assessment & Plan:        1. Left leg pain Rather sudden onset, just having had Xray of hip last week. If not greatly improving after a dose of ketorolac today will refer to orthopedics.   2. Acute gout of right ankle, unspecified cause  - ketorolac (TORADOL) 30 MG/ML injection 30 mg; Inject 1 mL (30 mg total) into the muscle once.  3. Cellulitis  foot Cephalexin 500mg   QID for 7 days.       [name], MD  [facility]

## 2015-05-29 ENCOUNTER — Telehealth: Payer: Self-pay | Admitting: *Deleted

## 2015-05-29 ENCOUNTER — Other Ambulatory Visit: Payer: Self-pay | Admitting: Family Medicine

## 2015-05-29 ENCOUNTER — Telehealth: Payer: Self-pay | Admitting: Family Medicine

## 2015-05-29 NOTE — Telephone Encounter (Signed)
-----   Message from [name], MD sent at 05/29/2015  7:48 AM EDT ----- Regarding: Follow up Please check with patient to see if leg pain is improved today after getting Toradol injection yesterday. If not doing much better then need referral to orthopedics this week. Thanks.

## 2015-05-29 NOTE — Telephone Encounter (Signed)
-----   Message from Wilder Glade, [place] sent at 05/29/2015  8:11 AM EDT ----- Regarding: RE: Follow up Patient reports that his leg pain is much better. He reports that he will let you know if symptoms worsen. He sends his thanks.   Mickie Hillier, CMA   ----- Message -----    From: [name], MD    Sent: 05/29/2015   7:48 AM      To: [facility] Nurse Subject: Follow up                                      Please check with patient to see if leg pain is improved today after getting Toradol injection yesterday. If not doing much better then need referral to orthopedics this week. Thanks.

## 2015-05-29 NOTE — Telephone Encounter (Addendum)
Pt contacted office for refill request on the following medications:  colesevelam (WELCHOL) 625 MG.  [facility].  SE#[id number]

## 2015-05-29 NOTE — Telephone Encounter (Signed)
Last ov was on 01/09/2015. Last refill for this medication was on 05/22/14 for 4X.  Thanks,

## 2015-05-29 NOTE — Telephone Encounter (Signed)
Patient stated that he feels a lot better today after the injection. He said that he is up and walking around with little pain.

## 2015-05-30 MED ORDER — COLESEVELAM HCL 625 MG PO TABS: 1875.0000 mg | ORAL_TABLET | Freq: Two times a day (BID) | ORAL | Status: DC

## 2015-06-22 ENCOUNTER — Other Ambulatory Visit: Payer: Self-pay | Admitting: Family Medicine

## 2015-06-22 NOTE — Telephone Encounter (Signed)
Pt contacted office for refill request on the following medications:  citalopram (CELEXA) 10 MG tablet.  [facility] mail [facility].  [id number]

## 2015-06-23 ENCOUNTER — Encounter: Payer: Self-pay | Admitting: Emergency Medicine

## 2015-06-23 ENCOUNTER — Emergency Department
Admission: EM | Admit: 2015-06-23 | Discharge: 2015-06-23 | Disposition: A | Discharge status: Home / Self Care | Payer: Medicare Other | Attending: Emergency Medicine | Admitting: Emergency Medicine

## 2015-06-23 ENCOUNTER — Emergency Department: Payer: Medicare Other

## 2015-06-23 DIAGNOSIS — R4182 Altered mental status, unspecified: Secondary | ICD-10-CM | POA: Diagnosis not present

## 2015-06-23 DIAGNOSIS — M6281 Muscle weakness (generalized): Secondary | ICD-10-CM | POA: Diagnosis not present

## 2015-06-23 DIAGNOSIS — R112 Nausea with vomiting, unspecified: Secondary | ICD-10-CM | POA: Insufficient documentation

## 2015-06-23 DIAGNOSIS — I129 Hypertensive chronic kidney disease with stage 1 through stage 4 chronic kidney disease, or unspecified chronic kidney disease: Secondary | ICD-10-CM | POA: Diagnosis not present

## 2015-06-23 DIAGNOSIS — Z79899 Other long term (current) drug therapy: Secondary | ICD-10-CM | POA: Diagnosis not present

## 2015-06-23 DIAGNOSIS — R5383 Other fatigue: Secondary | ICD-10-CM | POA: Diagnosis not present

## 2015-06-23 DIAGNOSIS — R531 Weakness: Secondary | ICD-10-CM | POA: Diagnosis not present

## 2015-06-23 DIAGNOSIS — N184 Chronic kidney disease, stage 4 (severe): Secondary | ICD-10-CM | POA: Diagnosis not present

## 2015-06-23 DIAGNOSIS — J811 Chronic pulmonary edema: Secondary | ICD-10-CM | POA: Diagnosis not present

## 2015-06-23 LAB — URINALYSIS COMPLETE WITH MICROSCOPIC (ARMC ONLY)
BILIRUBIN URINE: NEGATIVE
Bacteria, UA: NONE SEEN
Glucose, UA: NEGATIVE mg/dL
HGB URINE DIPSTICK: NEGATIVE
KETONES UR: NEGATIVE mg/dL
LEUKOCYTES UA: NEGATIVE
NITRITE: NEGATIVE
PH: 6 (ref 5.0–8.0)
Protein, ur: NEGATIVE mg/dL
SPECIFIC GRAVITY, URINE: 1.008 (ref 1.005–1.030)
Squamous Epithelial / LPF: NONE SEEN

## 2015-06-23 LAB — CBC
HEMATOCRIT: 36.3 % — AB (ref 40.0–52.0)
Hemoglobin: 11.9 g/dL — ABNORMAL LOW (ref 13.0–18.0)
MCH: 30.2 pg (ref 26.0–34.0)
MCHC: 32.7 g/dL (ref 32.0–36.0)
MCV: 92.3 fL (ref 80.0–100.0)
PLATELETS: 219 10*3/uL (ref 150–440)
RBC: 3.93 MIL/uL — AB (ref 4.40–5.90)
RDW: 16.1 % — AB (ref 11.5–14.5)
WBC: 6.3 10*3/uL (ref 3.8–10.6)

## 2015-06-23 LAB — GLUCOSE, CAPILLARY: Glucose-Capillary: 118 mg/dL — ABNORMAL HIGH (ref 65–99)

## 2015-06-23 LAB — BASIC METABOLIC PANEL
Anion gap: 9 (ref 5–15)
BUN: 32 mg/dL — AB (ref 6–20)
CHLORIDE: 107 mmol/L (ref 101–111)
CO2: 21 mmol/L — AB (ref 22–32)
CREATININE: 2.13 mg/dL — AB (ref 0.61–1.24)
Calcium: 9.4 mg/dL (ref 8.9–10.3)
GFR calc Af Amer: 30 mL/min — ABNORMAL LOW (ref >60)
GFR calc non Af Amer: 26 mL/min — ABNORMAL LOW (ref >60)
Glucose, Bld: 121 mg/dL — ABNORMAL HIGH (ref 65–99)
POTASSIUM: 4.1 mmol/L (ref 3.5–5.1)
SODIUM: 137 mmol/L (ref 135–145)

## 2015-06-23 LAB — TROPONIN I: Troponin I: <0.03 ng/mL (ref <0.031)

## 2015-06-23 MED ORDER — CITALOPRAM HYDROBROMIDE 10 MG PO TABS
5.0000 mg | ORAL_TABLET | Freq: Two times a day (BID) | ORAL | Status: DC
Start: 2015-06-23 — End: 2016-02-19

## 2015-06-23 MED ORDER — SODIUM CHLORIDE 0.9 % IV BOLUS (SEPSIS)
1000.0000 mL | Freq: Once | INTRAVENOUS | Status: AC
  Administered 2015-06-23: 1000 mL via INTRAVENOUS

## 2015-06-23 MED ORDER — ONDANSETRON HCL 4 MG/2ML IJ SOLN
4.0000 mg | Freq: Once | INTRAMUSCULAR | Status: AC
  Administered 2015-06-23: 4 mg via INTRAVENOUS
  Filled 2015-06-23: qty 2

## 2015-06-23 MED ORDER — SODIUM CHLORIDE 0.9 % IV BOLUS (SEPSIS)
500.0000 mL | Freq: Once | INTRAVENOUS | Status: AC
  Administered 2015-06-23: 500 mL via INTRAVENOUS

## 2015-06-23 MED ORDER — ONDANSETRON HCL 4 MG PO TABS: 4.0000 mg | ORAL_TABLET | Freq: Every day | ORAL | Status: DC | PRN

## 2015-06-23 NOTE — ED Provider Notes (Signed)
[facility] Emergency Department Provider Note  ____________________________________________  Time seen: 1210   I have reviewed the triage vital signs and the nursing notes.   HISTORY  Chief Complaint Weakness  emesis    HPI Keith Woods is a 81 y.o. male who went to attend his granddaughter's birthday party at [employer]. He felt well this morning and had normal breakfast and his usual medications. I'll standing at the [employer], he began to feel weak and needed to sit down and subsequently had multiple episodes of emesis. He has appeared fatigued since that event.  On exam now, the patient keeps his eyes closed most of the time and appears fatigued. He is communicative but does appear tired. He denies headache, chest pain, shortness of breath, or abdominal pain. He reports his nausea has improved. Other than fatigue, he overall does not have a focal complaint.  Additional history is from his wife was present in the emergency department.    Past Medical History  Diagnosis Date  . Syncope and collapse   . Hypertension   . Coronary artery disease     NONOBSTRUCTIVE by cath 2008  . History of transient ischemic attack (TIA)   . GERD (gastroesophageal reflux disease)   . History of prostate cancer   . Hyperlipidemia   . LBBB (left bundle branch block)     Patient Active Problem List   Diagnosis Date Noted  . Right leg pain 05/18/2015  . Acute gout 05/18/2015  . Wrist pain 03/15/2015  . Chronic kidney disease 02/02/2015  . Vertigo 02/02/2015  . Hearing loss 02/02/2015  . H/O malignant neoplasm of prostate 12/06/2012  . LBBB (left bundle branch block)   . Hypertension   . Coronary artery disease   . Hyperlipidemia     Past Surgical History  Procedure Laterality Date  . Cardiac catheterization  08/28/2006    EF 60%  . Tonsillectomy    . Transthoracic echocardiogram  10/10/2008    EF 55%    Current Outpatient  Rx  Name  Route  Sig  Dispense  Refill  . allopurinol (ZYLOPRIM) 100 MG tablet   Oral   Take 1 tablet (100 mg total) by mouth daily.   30 tablet   3   . amLODipine (NORVASC) 2.5 MG tablet   Oral   Take 2.5 mg by mouth as needed.         . citalopram (CELEXA) 10 MG tablet   Oral   Take 0.5 tablets (5 mg total) by mouth 2 (two) times daily.   30 tablet   11   . Colchicine (MITIGARE) 0.6 MG CAPS      Two tablets today, then 1 tablet daily   30 capsule   1   . colesevelam (WELCHOL) 625 MG tablet   Oral   Take 3 tablets (1,875 mg total) by mouth 2 (two) times daily.   540 tablet   3   . dipyridamole-aspirin (AGGRENOX) 25-200 MG per 12 hr capsule   Oral   Take 1 capsule by mouth 2 (two) times daily.           Marland Kitchen esomeprazole (NEXIUM) 40 MG capsule   Oral   Take 40 mg by mouth daily.          . fish oil-omega-3 fatty acids 1000 MG capsule   Oral   Take 1,200 mg by mouth daily.          . fluticasone (FLONASE) 50  MCG/ACT nasal spray      instill 2 sprays into each nostril once daily      0   . Multiple Vitamin (MULTIVITAMIN) tablet   Oral   Take 1 tablet by mouth daily.           . nitroGLYCERIN (NITROSTAT) 0.4 MG SL tablet   Sublingual   Place 1 tablet (0.4 mg total) under the tongue every 5 (five) minutes as needed for chest pain.   100 tablet   3   . ondansetron (ZOFRAN) 4 MG tablet   Oral   Take 1 tablet (4 mg total) by mouth daily as needed for nausea or vomiting.   10 tablet   0   . Probiotic Product (ALIGN) 4 MG CAPS   Oral   Take 4 mg by mouth daily.         . sodium bicarbonate 650 MG tablet   Oral   Take 650 mg by mouth 2 (two) times daily.      0   . Vitamin D, Ergocalciferol, (DRISDOL) 50000 UNITS CAPS   Oral   Take 50,000 Units by mouth every 7 (seven) days.            Allergies Lipitor; Nsaids; and Statins  Family History  Problem Relation Age of Onset  . Ovarian cancer Mother   . Prostate cancer Father      Social History Social History  Substance Use Topics  . Smoking status: Never Smoker   . Smokeless tobacco: None  . Alcohol Use: No    Review of Systems  Constitutional: General weakness and fatigue status post emesis. ENT: Negative for congestion. Cardiovascular: Negative for chest pain. Respiratory: Negative for cough. Gastrointestinal: Emesis prior to arrival. See history of present illness Genitourinary: Negative for dysuria. Musculoskeletal: No myalgias or injuries. Skin: Negative for rash. Neurological: Negative for headache or focal weakness   10-point ROS otherwise negative.  ____________________________________________   PHYSICAL EXAM:  VITAL SIGNS: ED Triage Vitals  Enc Vitals Group     BP 06/23/15 1144 143/69 mmHg     Pulse Rate 06/23/15 1144 64     Resp 06/23/15 1144 29     Temp 06/23/15 1144 97.5 F (36.4 C)     Temp Source 06/23/15 1144 Axillary     SpO2 06/23/15 1144 98 %     Weight 06/23/15 1144 160 lb (72.576 kg)     Height 06/23/15 1144 6\' 2"  (1.88 m)     Head Cir --      Peak Flow --      Pain Score 06/23/15 1146 0     Pain Loc --      Pain Edu? --      Excl. in [place]? --     Constitutional: Patient appears fatigued. Keeps his eyes closed through much of the discussion. Low energy. He is conversant and in no acute distress. ENT   Head: Normocephalic and atraumatic.   Nose: No congestion/rhinnorhea.       Mouth: No erythema, no swelling   Cardiovascular: Normal rate at 60, regular rhythm, no murmur noted Respiratory:  Normal respiratory effort, no tachypnea.    Breath sounds are clear and equal bilaterally.  Gastrointestinal: Soft and nontender. No distention.  Back: No muscle spasm, no tenderness, no CVA tenderness. Musculoskeletal: No deformity noted. Nontender with normal range of motion in all extremities.  No noted edema. Neurologic:  Communicative. 5 out of 5 strength in all 4 extremities. Intact cognition. Sensation  intact.  No gross focal neurologic deficits are appreciated.  Skin:  Skin is warm, dry. No rash noted. Psychiatric: Patient with flat affect, slightly withdrawn, low energy, but otherwise intact cognition. He does not remember the name of his granddaughter. He does seem to need some prompting by his wife for some details in communication.  ____________________________________________    LABS (pertinent positives/negatives)  Labs Reviewed  BASIC METABOLIC PANEL - Abnormal; Notable for the following:    CO2 21 (*)    Glucose, Bld 121 (*)    BUN 32 (*)    Creatinine, Ser 2.13 (*)    GFR calc non Af Amer 26 (*)    GFR calc Af Amer 30 (*)    All other components within normal limits  CBC - Abnormal; Notable for the following:    RBC 3.93 (*)    Hemoglobin 11.9 (*)    HCT 36.3 (*)    RDW 16.1 (*)    All other components within normal limits  URINALYSIS COMPLETEWITH MICROSCOPIC ([facility] ONLY) - Abnormal; Notable for the following:    Color, Urine YELLOW (*)    APPearance CLEAR (*)    All other components within normal limits  GLUCOSE, CAPILLARY - Abnormal; Notable for the following:    Glucose-Capillary 118 (*)    All other components within normal limits  TROPONIN I  CBG MONITORING, ED     ____________________________________________   EKG  ED ECG REPORT I, Yaxiel Minnie W, the attending physician, personally viewed and interpreted this ECG.   Date: 06/23/2015  EKG Time: 1144  Rate: 64  Rhythm: Normal sinus rhythm  Axis: Normal  Intervals: Left axis with left bundle branch block  ST&T Change: None noted   ____________________________________________    RADIOLOGY  CT head: IMPRESSION: 1. No acute intracranial abnormalities. 2. Age related volume loss. Mild to moderate chronic microvascular ischemic change.   Chest x-ray: IMPRESSION: Mild central pulmonary vascular congestion possibly related to resuscitation efforts.  Otherwise unremarkable chest x-ray. Heart size  is normal.  ____________________________________________   PROCEDURES    ____________________________________________   INITIAL IMPRESSION / ASSESSMENT AND PLAN / ED COURSE  Pertinent labs & imaging results that were available during my care of the patient were reviewed by me and considered in my medical decision making (see chart for details).  Intervention with this 81 year old male who was feeling well this morning but then felt weak had near syncope and emesis. He continues to appear a little bit weak, slightly altered. His memory is slightly limited. This may be baseline.  The exam is somewhat limited as the patient is vague and not great detail specific.  ----------------------------------------- 1:58 PM on 06/23/2015 -----------------------------------------  Patient remains stable in the emergency department. He does have mild renal dysfunction versus dehydration with a BUN of 32 and creatinine of 2.1. These values are not far from his baseline with a BUN of 24 and creatinine of approximately 2.0.  His  Hemoglobin is 11.9. Head CT and chest x-ray are overall unremarkable. Urinalysis is pending.  ----------------------------------------- 2:53 PM on 06/23/2015 -----------------------------------------  Urinalysis has returned and does not show sign of infection or blood. Reexamination of the patient finds him significantly improved from his arrival. He is more communicative and alert and interactive. He is not having any nausea.  I have discussed the test results with him and his family. He is comfortable going home and I think this is reasonable. I have asked him to follow-up with his regular doctor, Dr. Caryn Section,  this coming week.  ____________________________________________   FINAL CLINICAL IMPRESSION(S) / ED DIAGNOSES  Final diagnoses:  Chronic kidney disease, stage 4 (severe) (HCC)  General weakness  Non-intractable vomiting with nausea, vomiting of unspecified  type      Ahmed Prima, MD 06/23/15 [phone]

## 2015-06-23 NOTE — ED Notes (Signed)
Pt arrived via EMS from [facility].  Pt was there for a bday party and had onset of nausea and vomiting and diaphoretic.  Pt c/o weakness as well.  Nurse present on scene reports heart rate in the 40s and first responders had SBP in 80s initially.  CBG over 100 for EMS.  On arrival pt is pale,  diaphoretic and has emesis on clothing.  Skin cool to touch.

## 2015-06-23 NOTE — ED Notes (Signed)
Pt verbalized understanding of discharge instructions. NAD at this time. 

## 2015-06-23 NOTE — ED Notes (Addendum)
Family states when he first passed out, he made a noise that sounded like a snore, then he threw up 3 times. Family states he took all of medications this morning and ate breakfast this morning. Has not been sick recently or around others that have been sick. Pt did get a flu shot this season.

## 2015-06-23 NOTE — Discharge Instructions (Signed)
It is unclear what led to your episode earlier of weakness with nausea and vomiting. Your blood tests overall look good. We discussed your borderline kidney function tests. He received IV fluids in the emergency department and have significantly improved. Take Zofran if needed for further nausea. Return to the emergency department if you have further general weakness, near fainting, chest pain, or other urgent concerns. Follow-up with regular doctor this week place.  Nausea and Vomiting Nausea means you feel sick to your stomach. Throwing up (vomiting) is a reflex where stomach contents come out of your mouth. HOME CARE   Take medicine as told by your doctor.  Do not force yourself to eat. However, you do need to drink fluids.  If you feel like eating, eat a normal diet as told by your doctor.  Eat rice, wheat, potatoes, bread, lean meats, yogurt, fruits, and vegetables.  Avoid high-fat foods.  Drink enough fluids to keep your pee (urine) clear or pale yellow.  Ask your doctor how to replace body fluid losses (rehydrate). Signs of body fluid loss (dehydration) include:  Feeling very thirsty.  Dry lips and mouth.  Feeling dizzy.  Dark pee.  Peeing less than normal.  Feeling confused.  Fast breathing or heart rate. GET HELP RIGHT AWAY IF:   You have blood in your throw up.  You have black or bloody poop (stool).  You have a bad headache or stiff neck.  You feel confused.  You have bad belly (abdominal) pain.  You have chest pain or trouble breathing.  You do not pee at least once every 8 hours.  You have cold, clammy skin.  You keep throwing up after 24 to 48 hours.  You have a fever. MAKE SURE YOU:   Understand these instructions.  Will watch your condition.  Will get help right away if you are not doing well or get worse.   This information is not intended to replace advice given to you by your health care provider. Make sure you discuss any questions you  have with your health care provider.   Document Released: 01/14/2008 Document Revised: 10/20/2011 Document Reviewed: 12/27/2010 Elsevier Interactive Patient Education [employer].

## 2015-06-25 ENCOUNTER — Telehealth: Payer: Self-pay | Admitting: Family Medicine

## 2015-06-25 NOTE — Telephone Encounter (Signed)
Pt is scheduled for hospital f/u for Thursday 06/28/15. Pt was seen at [facility] ER on Saturday 06/23/15 and was released same day. Thanks TNP

## 2015-06-28 ENCOUNTER — Ambulatory Visit (INDEPENDENT_AMBULATORY_CARE_PROVIDER_SITE_OTHER): Payer: Medicare Other | Admitting: Family Medicine

## 2015-06-28 ENCOUNTER — Encounter: Payer: Self-pay | Admitting: Family Medicine

## 2015-06-28 VITALS — BP 142/74 | HR 68 | Temp 97.9°F | Resp 16 | Wt 177.0 lb

## 2015-06-28 DIAGNOSIS — I2510 Atherosclerotic heart disease of native coronary artery without angina pectoris: Secondary | ICD-10-CM | POA: Diagnosis not present

## 2015-06-28 DIAGNOSIS — R55 Syncope and collapse: Secondary | ICD-10-CM | POA: Diagnosis not present

## 2015-06-28 DIAGNOSIS — R1111 Vomiting without nausea: Secondary | ICD-10-CM | POA: Diagnosis not present

## 2015-06-28 DIAGNOSIS — R5383 Other fatigue: Secondary | ICD-10-CM

## 2015-06-28 DIAGNOSIS — I499 Cardiac arrhythmia, unspecified: Secondary | ICD-10-CM | POA: Diagnosis not present

## 2015-06-28 NOTE — Progress Notes (Signed)
Patient ID: Keith Woods, male   DOB: [birth year], 81 y.o.   MRN: [id number]       Patient: Keith Woods Male    DOB: 26-Jan-1926   81 y.o.   MRN: [id number] Visit Date: 06/28/2015  Today's Provider: [name], MD   Chief Complaint  Patient presents with  . Transition into care    Patient was seen in the ED on 06/23/15. He was discharged later that day.   . Fatigue   Subjective:    HPI Patient was seen in the ED on 06/23/15 with weakness and emesis. He had a CT scan and chest x-ray which was overall unremarkable. Patient reports that he has not had any other episodes since he was last seen in the ED. He states he drank some out of date orange juice the morning before this episode. The episode occurred at a birthday part at the [facility]. He had sudden onset of feeling weak and dizzy, followed by projectile vomiting. There was no chest pain, palpitations, shortness of breath of altered consciousness. Only lab abnormality was impaired creatine clearance which was consistent with baseline. He was given some IV fluids and states he has had no episodes since. He feels fatigued today, but no more so than his usual, and is otherwise fine.       Allergies  Allergen Reactions  . Lipitor [Atorvastatin Calcium]   . Nsaids   . Statins    Previous Medications   ALLOPURINOL (ZYLOPRIM) 100 MG TABLET    Take 1 tablet (100 mg total) by mouth daily.   AMLODIPINE (NORVASC) 2.5 MG TABLET    Take 2.5 mg by mouth as needed.   CITALOPRAM (CELEXA) 10 MG TABLET    Take 0.5 tablets (5 mg total) by mouth 2 (two) times daily.   COLCHICINE (MITIGARE) 0.6 MG CAPS    Two tablets today, then 1 tablet daily   COLESEVELAM (WELCHOL) 625 MG TABLET    Take 3 tablets (1,875 mg total) by mouth 2 (two) times daily.   DIPYRIDAMOLE-ASPIRIN (AGGRENOX) 25-200 MG PER 12 HR CAPSULE    Take 1 capsule by mouth 2 (two) times daily.     ESOMEPRAZOLE (NEXIUM) 40 MG CAPSULE    Take 40 mg by mouth daily.      FISH OIL-OMEGA-3 FATTY ACIDS 1000 MG CAPSULE    Take 1,200 mg by mouth daily.    FLUTICASONE (FLONASE) 50 MCG/ACT NASAL SPRAY    instill 2 sprays into each nostril once daily   MULTIPLE VITAMIN (MULTIVITAMIN) TABLET    Take 1 tablet by mouth daily.     NITROGLYCERIN (NITROSTAT) 0.4 MG SL TABLET    Place 1 tablet (0.4 mg total) under the tongue every 5 (five) minutes as needed for chest pain.   ONDANSETRON (ZOFRAN) 4 MG TABLET    Take 1 tablet (4 mg total) by mouth daily as needed for nausea or vomiting.   PROBIOTIC PRODUCT (ALIGN) 4 MG CAPS    Take 4 mg by mouth daily.   SODIUM BICARBONATE 650 MG TABLET    Take 650 mg by mouth 2 (two) times daily.   VITAMIN D, ERGOCALCIFEROL, (DRISDOL) 50000 UNITS CAPS    Take 50,000 Units by mouth every 7 (seven) days.     Review of Systems  Constitutional: Positive for fatigue. Negative for fever, chills and appetite change.  Respiratory: Negative.  Negative for chest tightness, shortness of breath and wheezing.   Cardiovascular: Negative.  Negative for chest pain and  palpitations.  Gastrointestinal: Negative for nausea, vomiting and abdominal pain.  Musculoskeletal: Negative.   Neurological: Negative.   Psychiatric/Behavioral: Negative.     Social History  Substance Use Topics  . Smoking status: Never Smoker   . Smokeless tobacco: Not on file  . Alcohol Use: No   Objective:   BP 142/74 mmHg  Pulse 68  Temp(Src) 97.9 F (36.6 C)  Resp 16  Wt 177 lb (80.287 kg)  SpO2 98%  Physical Exam   General Appearance:    Alert, cooperative, no distress  Eyes:    PERRL, conjunctiva/corneas clear, EOM's intact       Lungs:     Clear to auscultation bilaterally, respirations unlabored  Heart:    Regular rate irregular rhythm  Neurologic:   Awake, alert, oriented x 3. No apparent focal neurological           defect.      EKG: SR with rate variation    Assessment & Plan:     1. Vomiting without nausea, vomiting of unspecified type Single episode,  back to baseline after ER evaluation and IVF. No sign of any chronic conditions causing episode. May have had been reaction to spoiled orange juice he drank that morning.   2. Near syncope Resolved after episode above. No back to baseline  3. Other fatigue Chronic, multi-factoriial including CKD, depression and deconditioning. Encourage remain as physical active as possible and try to walk for exercise daily.   4. Irregular heart beat Noted on auscultation today. EKG shows NSR with physiologic rate variation.  - EKG 12-Lead        [name], MD  [facility]

## 2015-07-02 DIAGNOSIS — I10 Essential (primary) hypertension: Secondary | ICD-10-CM | POA: Diagnosis not present

## 2015-07-02 DIAGNOSIS — E875 Hyperkalemia: Secondary | ICD-10-CM | POA: Diagnosis not present

## 2015-07-02 DIAGNOSIS — D631 Anemia in chronic kidney disease: Secondary | ICD-10-CM | POA: Diagnosis not present

## 2015-07-02 DIAGNOSIS — N2581 Secondary hyperparathyroidism of renal origin: Secondary | ICD-10-CM | POA: Diagnosis not present

## 2015-07-02 DIAGNOSIS — N184 Chronic kidney disease, stage 4 (severe): Secondary | ICD-10-CM | POA: Diagnosis not present

## 2015-07-08 DIAGNOSIS — R5383 Other fatigue: Secondary | ICD-10-CM | POA: Insufficient documentation

## 2015-07-16 ENCOUNTER — Other Ambulatory Visit: Payer: Self-pay | Admitting: Family Medicine

## 2015-07-20 ENCOUNTER — Ambulatory Visit (INDEPENDENT_AMBULATORY_CARE_PROVIDER_SITE_OTHER): Payer: Medicare Other | Admitting: Family Medicine

## 2015-07-20 ENCOUNTER — Encounter: Payer: Self-pay | Admitting: Family Medicine

## 2015-07-20 VITALS — BP 162/78 | HR 80 | Resp 16 | Wt 178.6 lb

## 2015-07-20 DIAGNOSIS — M25531 Pain in right wrist: Secondary | ICD-10-CM

## 2015-07-20 DIAGNOSIS — M109 Gout, unspecified: Secondary | ICD-10-CM

## 2015-07-20 DIAGNOSIS — I2510 Atherosclerotic heart disease of native coronary artery without angina pectoris: Secondary | ICD-10-CM | POA: Diagnosis not present

## 2015-07-20 DIAGNOSIS — S46812A Strain of other muscles, fascia and tendons at shoulder and upper arm level, left arm, initial encounter: Secondary | ICD-10-CM

## 2015-07-20 DIAGNOSIS — M10041 Idiopathic gout, right hand: Secondary | ICD-10-CM

## 2015-07-20 MED ORDER — COLCHICINE 0.6 MG PO CAPS: ORAL_CAPSULE | ORAL | Status: DC

## 2015-07-20 MED ORDER — ALLOPURINOL 100 MG PO TABS: 200.0000 mg | ORAL_TABLET | Freq: Every day | ORAL | Status: DC

## 2015-07-20 MED ORDER — KETOROLAC TROMETHAMINE 30 MG/ML IJ SOLN
30.0000 mg | Freq: Once | INTRAMUSCULAR | Status: AC
  Administered 2015-07-20: 30 mg via INTRAMUSCULAR

## 2015-07-20 NOTE — Progress Notes (Signed)
Patient ID: Yohaan Theile, male   DOB: 02-04-1926, 81 y.o.   MRN: [id number]   Patient: Diquan Chehab Regula Male    DOB: 1925-12-09   81 y.o.   MRN: [id number] Visit Date: 07/20/2015  Today's Provider: [name], MD   Chief Complaint  Patient presents with  . Hand Pain    right hand and wrist X 2 days  . Wrist Pain   Subjective:   Patient states he had a fall in July 2016 and injured right hand and wrist. Pain subsided and on Wednesday pain returned. Was followed by orthopedist at that time who felt he had gout in his wrist. He responded rapidly to colchicine which he took until about 3 weeks ago. Has also been on allopurinol for several months for elevated uric acid. Pain he is currently experiencing is just like the pain he had that was diagnosed as gout.   Hand Pain  The injury mechanism was a fall (in July). The pain is present in the right wrist and right hand. The pain is at a severity of 7/10. The pain is moderate. He has tried acetaminophen for the symptoms. The treatment provided moderate relief.   He also complains of about 1 week history of pain underneath his left shoulder blade. Has had no recent injuries or falls.     Previous Medications   ALLOPURINOL (ZYLOPRIM) 100 MG TABLET    Take 1 tablet (100 mg total) by mouth daily.   AMLODIPINE (NORVASC) 2.5 MG TABLET    Take 2.5 mg by mouth as needed.   CITALOPRAM (CELEXA) 10 MG TABLET    Take 0.5 tablets (5 mg total) by mouth 2 (two) times daily.   COLCHICINE (MITIGARE) 0.6 MG CAPS    Two tablets today, then 1 tablet daily   COLESEVELAM (WELCHOL) 625 MG TABLET    Take 3 tablets (1,875 mg total) by mouth 2 (two) times daily.   DIPYRIDAMOLE-ASPIRIN (AGGRENOX) 200-25 MG 12HR CAPSULE    TAKE 1 CAPSULE TWICE A DAY   ESOMEPRAZOLE (NEXIUM) 40 MG CAPSULE    Take 40 mg by mouth daily.    FISH OIL-OMEGA-3 FATTY ACIDS 1000 MG CAPSULE    Take 1,200 mg by mouth daily.    FLUTICASONE (FLONASE) 50 MCG/ACT NASAL SPRAY    instill 2  sprays into each nostril once daily   MULTIPLE VITAMIN (MULTIVITAMIN) TABLET    Take 1 tablet by mouth daily.     NITROGLYCERIN (NITROSTAT) 0.4 MG SL TABLET    Place 1 tablet (0.4 mg total) under the tongue every 5 (five) minutes as needed for chest pain.   ONDANSETRON (ZOFRAN) 4 MG TABLET    Take 1 tablet (4 mg total) by mouth daily as needed for nausea or vomiting.   PROBIOTIC PRODUCT (ALIGN) 4 MG CAPS    Take 4 mg by mouth daily.   SODIUM BICARBONATE 650 MG TABLET    Take 650 mg by mouth 2 (two) times daily.   VITAMIN D, ERGOCALCIFEROL, (DRISDOL) 50000 UNITS CAPS    Take 50,000 Units by mouth every 7 (seven) days.     Review of Systems  Constitutional: Negative.   HENT: Negative.   Eyes: Negative.   Respiratory: Negative.   Cardiovascular: Negative.   Gastrointestinal: Negative.   Endocrine: Negative.   Genitourinary: Negative.   Musculoskeletal: Positive for joint swelling and arthralgias.  Skin: Negative.   Allergic/Immunologic: Negative.   Neurological: Negative.   Hematological: Negative.   Psychiatric/Behavioral: Negative.  Social History  Substance Use Topics  . Smoking status: Never Smoker   . Smokeless tobacco: Not on file  . Alcohol Use: No   Objective:   BP 162/78 mmHg  Pulse 80  Resp 16  Wt 178 lb 9.6 oz (81.012 kg)  SpO2 98%  Physical Exam  General appearance: alert, well developed, well nourished, cooperative and in no distress Psych: Appropriate mood and affect. Neurologic: Mental status: Alert, oriented to person, place, and time, thought content appropriate.  Musc: Very swollen tight hand, fingers and wrist. Slightly warm to touch over wrist. Unable to flex wrsit more than 15 degrees or extend more than 5.  Tender over left subscapularis. No swelling. +5 muscle strength UEs.      Assessment & Plan:     1. Wrist pain, acute, right Responded well to colchicine in the past. Will restart and keep him on this until uric acid levels are very low -  Colchicine (MITIGARE) 0.6 MG CAPS; Two tablets today, then 1 tablet daily  Dispense: 30 capsule; Refill: 3   2. Acute gout of right hand, unspecified cause Restart colchicine as above. Double allopurinol and check uric acid levels at follow up in 3-4 weeks.  - allopurinol (ZYLOPRIM) 100 MG tablet; Take 2 tablets (200 mg total) by mouth daily.  Dispense: 60 tablet; Refill: 3  3. Subscapularis (muscle) sprain and strain, left, initial encounter  - ketorolac (TORADOL) 30 MG/ML injection 30 mg; Inject 1 mL (30 mg total) into the muscle once.  Follow up: No Follow-up on file.

## 2015-08-10 ENCOUNTER — Ambulatory Visit: Payer: Medicare Other | Admitting: Family Medicine

## 2015-08-15 ENCOUNTER — Ambulatory Visit (INDEPENDENT_AMBULATORY_CARE_PROVIDER_SITE_OTHER): Payer: Medicare Other | Admitting: Family Medicine

## 2015-08-15 ENCOUNTER — Encounter: Payer: Self-pay | Admitting: Family Medicine

## 2015-08-15 VITALS — BP 146/80 | HR 80 | Temp 97.6°F | Resp 16 | Wt 177.0 lb

## 2015-08-15 DIAGNOSIS — N644 Mastodynia: Secondary | ICD-10-CM | POA: Diagnosis not present

## 2015-08-15 DIAGNOSIS — M10041 Idiopathic gout, right hand: Secondary | ICD-10-CM

## 2015-08-15 DIAGNOSIS — M109 Gout, unspecified: Secondary | ICD-10-CM | POA: Insufficient documentation

## 2015-08-15 DIAGNOSIS — M25531 Pain in right wrist: Secondary | ICD-10-CM

## 2015-08-15 DIAGNOSIS — M79604 Pain in right leg: Secondary | ICD-10-CM

## 2015-08-15 MED ORDER — CEPHALEXIN 500 MG PO CAPS: 500.0000 mg | ORAL_CAPSULE | Freq: Four times a day (QID) | ORAL | Status: AC

## 2015-08-15 NOTE — Progress Notes (Signed)
Patient: Keith Woods Male    DOB: 1925-09-16   82 y.o.   MRN: [id number] Visit Date: 08/15/2015  Today's Provider: [name], MD   Chief Complaint  Patient presents with  . Gout    follow up   Subjective:    HPI  Follow up Gout: Last office visit was 3 weeks ago. Management during that visit includes restarting Colchicine and doubling up on Allopurinol. Patient reports good compliance with treatment and good tolerance. Since making these changes patient states the pain and swelling in his wrist has since resolved.   Leg pain He was given injections of ketorolac at last visit for left leg pain which has resolve. However, he states that he is now having intermittent pain in right anterior thigh which only occurs when he is walking. It will start hurting out of the blue and quickly resolves once he sits down. He feels like his leg gives out when this happens, but he has not fallen. He does have a cane, but does not use it.   Breast tenderness States he has had tender area behind left areola for the last month. Has not noticed any swelling or redness and only hurts when he presses against it.     Allergies  Allergen Reactions  . Lipitor [Atorvastatin Calcium]   . Nsaids   . Statins    Previous Medications   ALLOPURINOL (ZYLOPRIM) 100 MG TABLET    Take 2 tablets (200 mg total) by mouth daily.   AMLODIPINE (NORVASC) 2.5 MG TABLET    Take 2.5 mg by mouth as needed.   CITALOPRAM (CELEXA) 10 MG TABLET    Take 0.5 tablets (5 mg total) by mouth 2 (two) times daily.   COLCHICINE (MITIGARE) 0.6 MG CAPS    Two tablets today, then 1 tablet daily   COLESEVELAM (WELCHOL) 625 MG TABLET    Take 3 tablets (1,875 mg total) by mouth 2 (two) times daily.   DIPYRIDAMOLE-ASPIRIN (AGGRENOX) 200-25 MG 12HR CAPSULE    TAKE 1 CAPSULE TWICE A DAY   ESOMEPRAZOLE (NEXIUM) 40 MG CAPSULE    Take 40 mg by mouth daily.    FISH OIL-OMEGA-3 FATTY ACIDS 1000 MG CAPSULE    Take 1,200 mg by mouth  daily.    FLUTICASONE (FLONASE) 50 MCG/ACT NASAL SPRAY    instill 2 sprays into each nostril once daily   MULTIPLE VITAMIN (MULTIVITAMIN) TABLET    Take 1 tablet by mouth daily.     NITROGLYCERIN (NITROSTAT) 0.4 MG SL TABLET    Place 1 tablet (0.4 mg total) under the tongue every 5 (five) minutes as needed for chest pain.   ONDANSETRON (ZOFRAN) 4 MG TABLET    Take 1 tablet (4 mg total) by mouth daily as needed for nausea or vomiting.   PROBIOTIC PRODUCT (ALIGN) 4 MG CAPS    Take 4 mg by mouth daily.   SODIUM BICARBONATE 650 MG TABLET    Take 650 mg by mouth 2 (two) times daily.   VITAMIN D, ERGOCALCIFEROL, (DRISDOL) 50000 UNITS CAPS    Take 50,000 Units by mouth every 7 (seven) days.     Review of Systems  Constitutional: Negative for fever, chills and appetite change.  Respiratory: Negative for chest tightness, shortness of breath and wheezing.   Cardiovascular: Negative for chest pain and palpitations.  Gastrointestinal: Negative for nausea, vomiting and abdominal pain.  Musculoskeletal: Positive for myalgias (right leg pain).  Neurological: Positive for weakness (in right  leg) and numbness (right foot falls asleep "tingling").    Social History  Substance Use Topics  . Smoking status: Never Smoker   . Smokeless tobacco: Not on file  . Alcohol Use: No   Objective:   BP 146/80 mmHg  Pulse 80  Temp(Src) 97.6 F (36.4 C) (Oral)  Resp 16  Wt 177 lb (80.287 kg)  Physical Exam  General appearance: alert, well developed, well nourished, cooperative and in no distress Head: Normocephalic, without obvious abnormality, atraumatic Lungs: Respirations even and unlabored Extremities: FROM of right leg with no tenderness or swelling of right anterior thigh. Fully extends leg against resistence without pain.  Breast: About BB size cystic nodule behind superior aspect of left areola which is tender to palpation. No erythema.       Assessment & Plan:     1. Right wrist pain Resolved  since starting colchicine. Suspect gout  2. Acute gout of right hand, unspecified cause Is tolerating increased dose of allopurinol. Recheck uric acid levels. Try to get under 5.0 - Uric acid  3. Mastalgia Start empiric course of cephalexin. Consider ultrasound if not resolved in 2 weeks.   4. Right leg pain Intermittent and only occurs sometimes when ambulating. Advised to always use can when walking. I doubt that systemic medications would be of any benefit. Can try OTC Aspercreme or Icy-hot three times daily.         [name], MD  [facility]

## 2015-08-15 NOTE — Patient Instructions (Signed)
   Apply Icy-Hot or Aspercreme to right leg three times a day  Always use cane when walking.

## 2015-08-16 LAB — URIC ACID: Uric Acid: 5.3 mg/dL (ref 3.7–8.6)

## 2015-08-22 ENCOUNTER — Other Ambulatory Visit: Payer: Self-pay | Admitting: Family Medicine

## 2015-08-22 MED ORDER — AMLODIPINE BESYLATE 2.5 MG PO TABS: 2.5000 mg | ORAL_TABLET | ORAL | Status: DC | PRN

## 2015-08-22 NOTE — Telephone Encounter (Signed)
Pt contacted office for refill request on the following medications: amLODipine (NORVASC) 2.5 MG tablet to [facility]. Thanks TNP

## 2015-08-30 ENCOUNTER — Telehealth: Payer: Self-pay | Admitting: Family Medicine

## 2015-08-30 DIAGNOSIS — N644 Mastodynia: Secondary | ICD-10-CM

## 2015-08-30 NOTE — Telephone Encounter (Signed)
Patient reports that he is still having pain in his left breast. Please order ultrasound. Thanks!

## 2015-08-30 NOTE — Telephone Encounter (Signed)
Per [facility] for breast pain an order for bilateral diagnostic mammogram and an order for right/left breast ultrasound is needed

## 2015-08-30 NOTE — Telephone Encounter (Signed)
-----   Message from [name], MD sent at 08/15/2015  9:29 AM EST ----- Regarding: follow up mastalgia Ultrasound left breast if not better after 2 weeks cephalexin prescribed 08-15-15

## 2015-08-30 NOTE — Telephone Encounter (Signed)
Orders in Epic.

## 2015-08-30 NOTE — Telephone Encounter (Signed)
Please order ultrasound left breast for left retroareolar pain and swelling.

## 2015-08-30 NOTE — Telephone Encounter (Signed)
Please check with patient to see if pain in left breast has gone away since taking antibiotic. If is has then no further evaluation is needed. If not then we will need to order ultrasound.

## 2015-09-03 ENCOUNTER — Other Ambulatory Visit: Payer: Self-pay | Admitting: Family Medicine

## 2015-09-03 DIAGNOSIS — E785 Hyperlipidemia, unspecified: Secondary | ICD-10-CM

## 2015-09-12 ENCOUNTER — Ambulatory Visit
Admission: RE | Admit: 2015-09-12 | Discharge: 2015-09-12 | Disposition: A | Discharge status: Home / Self Care | Payer: Medicare Other | Source: Ambulatory Visit | Attending: Family Medicine | Admitting: Family Medicine

## 2015-09-12 ENCOUNTER — Other Ambulatory Visit: Payer: Self-pay | Admitting: Family Medicine

## 2015-09-12 DIAGNOSIS — N644 Mastodynia: Secondary | ICD-10-CM

## 2015-09-12 DIAGNOSIS — Z8546 Personal history of malignant neoplasm of prostate: Secondary | ICD-10-CM | POA: Insufficient documentation

## 2015-09-12 DIAGNOSIS — N62 Hypertrophy of breast: Secondary | ICD-10-CM | POA: Insufficient documentation

## 2015-09-12 DIAGNOSIS — N63 Unspecified lump in breast: Secondary | ICD-10-CM | POA: Diagnosis not present

## 2015-10-01 DIAGNOSIS — N184 Chronic kidney disease, stage 4 (severe): Secondary | ICD-10-CM | POA: Diagnosis not present

## 2015-10-01 DIAGNOSIS — G909 Disorder of the autonomic nervous system, unspecified: Secondary | ICD-10-CM | POA: Diagnosis not present

## 2015-10-01 DIAGNOSIS — M109 Gout, unspecified: Secondary | ICD-10-CM | POA: Diagnosis not present

## 2015-10-01 DIAGNOSIS — N2581 Secondary hyperparathyroidism of renal origin: Secondary | ICD-10-CM | POA: Diagnosis not present

## 2015-10-01 DIAGNOSIS — E785 Hyperlipidemia, unspecified: Secondary | ICD-10-CM | POA: Diagnosis not present

## 2015-10-01 DIAGNOSIS — D631 Anemia in chronic kidney disease: Secondary | ICD-10-CM | POA: Diagnosis not present

## 2015-10-01 DIAGNOSIS — E872 Acidosis: Secondary | ICD-10-CM | POA: Diagnosis not present

## 2015-10-01 DIAGNOSIS — I129 Hypertensive chronic kidney disease with stage 1 through stage 4 chronic kidney disease, or unspecified chronic kidney disease: Secondary | ICD-10-CM | POA: Diagnosis not present

## 2015-10-05 DIAGNOSIS — H2513 Age-related nuclear cataract, bilateral: Secondary | ICD-10-CM | POA: Diagnosis not present

## 2015-10-08 ENCOUNTER — Other Ambulatory Visit: Payer: Self-pay | Admitting: Family Medicine

## 2015-10-15 ENCOUNTER — Emergency Department: Payer: Medicare Other

## 2015-10-15 ENCOUNTER — Ambulatory Visit (INDEPENDENT_AMBULATORY_CARE_PROVIDER_SITE_OTHER): Payer: Medicare Other | Admitting: Family Medicine

## 2015-10-15 ENCOUNTER — Encounter: Payer: Self-pay | Admitting: Family Medicine

## 2015-10-15 ENCOUNTER — Emergency Department
Admission: EM | Admit: 2015-10-15 | Discharge: 2015-10-15 | Disposition: A | Discharge status: Home / Self Care | Payer: Medicare Other | Attending: Emergency Medicine | Admitting: Emergency Medicine

## 2015-10-15 ENCOUNTER — Encounter: Payer: Self-pay | Admitting: Emergency Medicine

## 2015-10-15 VITALS — BP 120/80 | HR 87 | Temp 98.3°F | Resp 16 | Wt 169.0 lb

## 2015-10-15 DIAGNOSIS — M542 Cervicalgia: Secondary | ICD-10-CM

## 2015-10-15 DIAGNOSIS — R55 Syncope and collapse: Secondary | ICD-10-CM

## 2015-10-15 DIAGNOSIS — N189 Chronic kidney disease, unspecified: Secondary | ICD-10-CM | POA: Insufficient documentation

## 2015-10-15 DIAGNOSIS — Z7951 Long term (current) use of inhaled steroids: Secondary | ICD-10-CM | POA: Diagnosis not present

## 2015-10-15 DIAGNOSIS — Z79899 Other long term (current) drug therapy: Secondary | ICD-10-CM | POA: Insufficient documentation

## 2015-10-15 DIAGNOSIS — R4182 Altered mental status, unspecified: Secondary | ICD-10-CM | POA: Diagnosis not present

## 2015-10-15 DIAGNOSIS — I129 Hypertensive chronic kidney disease with stage 1 through stage 4 chronic kidney disease, or unspecified chronic kidney disease: Secondary | ICD-10-CM | POA: Diagnosis not present

## 2015-10-15 DIAGNOSIS — M546 Pain in thoracic spine: Secondary | ICD-10-CM | POA: Insufficient documentation

## 2015-10-15 DIAGNOSIS — R1111 Vomiting without nausea: Secondary | ICD-10-CM | POA: Diagnosis not present

## 2015-10-15 LAB — URINALYSIS COMPLETE WITH MICROSCOPIC (ARMC ONLY)
BACTERIA UA: NONE SEEN
BILIRUBIN URINE: NEGATIVE
Glucose, UA: NEGATIVE mg/dL
HGB URINE DIPSTICK: NEGATIVE
KETONES UR: NEGATIVE mg/dL
Leukocytes, UA: NEGATIVE
NITRITE: NEGATIVE
PH: 5 (ref 5.0–8.0)
PROTEIN: 30 mg/dL — AB
Specific Gravity, Urine: 1.016 (ref 1.005–1.030)

## 2015-10-15 LAB — COMPREHENSIVE METABOLIC PANEL
ALBUMIN: 3.4 g/dL — AB (ref 3.5–5.0)
ALK PHOS: 112 U/L (ref 38–126)
ALT: 22 U/L (ref 17–63)
AST: 30 U/L (ref 15–41)
Anion gap: 11 (ref 5–15)
BUN: 28 mg/dL — AB (ref 6–20)
CALCIUM: 8.9 mg/dL (ref 8.9–10.3)
CHLORIDE: 105 mmol/L (ref 101–111)
CO2: 19 mmol/L — AB (ref 22–32)
CREATININE: 2.18 mg/dL — AB (ref 0.61–1.24)
GFR calc Af Amer: 29 mL/min — ABNORMAL LOW (ref >60)
GFR calc non Af Amer: 25 mL/min — ABNORMAL LOW (ref >60)
GLUCOSE: 135 mg/dL — AB (ref 65–99)
Potassium: 3.9 mmol/L (ref 3.5–5.1)
SODIUM: 135 mmol/L (ref 135–145)
Total Bilirubin: 0.8 mg/dL (ref 0.3–1.2)
Total Protein: 7.3 g/dL (ref 6.5–8.1)

## 2015-10-15 LAB — CBC WITH DIFFERENTIAL/PLATELET
Basophils Absolute: 0 10*3/uL (ref 0–0.1)
Basophils Relative: 0 %
Eosinophils Absolute: 0 10*3/uL (ref 0–0.7)
HEMATOCRIT: 32.6 % — AB (ref 40.0–52.0)
Hemoglobin: 10.9 g/dL — ABNORMAL LOW (ref 13.0–18.0)
LYMPHS ABS: 0.9 10*3/uL — AB (ref 1.0–3.6)
MCH: 31.2 pg (ref 26.0–34.0)
MCHC: 33.5 g/dL (ref 32.0–36.0)
MCV: 93.2 fL (ref 80.0–100.0)
MONO ABS: 2.1 10*3/uL — AB (ref 0.2–1.0)
Monocytes Relative: 18 %
NEUTROS ABS: 8.2 10*3/uL — AB (ref 1.4–6.5)
Neutrophils Relative %: 74 %
PLATELETS: 225 10*3/uL (ref 150–440)
RBC: 3.5 MIL/uL — ABNORMAL LOW (ref 4.40–5.90)
RDW: 15.9 % — AB (ref 11.5–14.5)
WBC: 11.2 10*3/uL — ABNORMAL HIGH (ref 3.8–10.6)

## 2015-10-15 LAB — PROTIME-INR
INR: 1.23
Prothrombin Time: 15.7 seconds — ABNORMAL HIGH (ref 11.4–15.0)

## 2015-10-15 LAB — TROPONIN I
Troponin I: <0.03 ng/mL (ref <0.031)
Troponin I: <0.03 ng/mL (ref <0.031)

## 2015-10-15 LAB — LIPASE, BLOOD: Lipase: 13 U/L (ref 11–51)

## 2015-10-15 MED ORDER — SODIUM CHLORIDE 0.9 % IV BOLUS (SEPSIS): 1000.0000 mL | Freq: Once | INTRAVENOUS | Status: DC

## 2015-10-15 MED ORDER — SODIUM CHLORIDE 0.9 % IV BOLUS (SEPSIS)
500.0000 mL | Freq: Once | INTRAVENOUS | Status: AC
  Administered 2015-10-15: 500 mL via INTRAVENOUS

## 2015-10-15 MED ORDER — CYCLOBENZAPRINE HCL 5 MG PO TABS: 5.0000 mg | ORAL_TABLET | Freq: Every day | ORAL | Status: AC

## 2015-10-15 MED ORDER — KETOROLAC TROMETHAMINE 60 MG/2ML IM SOLN
60.0000 mg | Freq: Once | INTRAMUSCULAR | Status: AC
  Administered 2015-10-15: 60 mg via INTRAMUSCULAR

## 2015-10-15 NOTE — ED Notes (Signed)
Pt assisted to standing with help.  Pt unable to void at this time.  Pt given diet tray and fluids.  Pt alert and oriented but states he did feel a little weak with standing.  Family remains at bedside.

## 2015-10-15 NOTE — ED Notes (Signed)
Pt refused CT  

## 2015-10-15 NOTE — Progress Notes (Signed)
Patient: Keith Woods Male    DOB: Oct 07, 1925   82 y.o.   MRN: [id number] Visit Date: 10/15/2015  Today's Provider: [name], MD   Chief Complaint  Patient presents with  . Neck Pain   Subjective:    Neck Pain  This is a new problem. The current episode started in the past 7 days (2 days ago). The problem has been gradually worsening. The pain is associated with an unknown factor. The pain is present in the left side and anterior neck. The quality of the pain is described as stabbing, aching, cramping and shooting. The pain is at a severity of 7/10. The pain is moderate. Nothing aggravates the symptoms. The pain is same all the time. Stiffness is present all day. Associated symptoms include leg pain, tingling, a visual change and weakness. Pertinent negatives include no chest pain, fever, headaches, numbness, pain with swallowing, paresis, photophobia, syncope or trouble swallowing. He has tried acetaminophen and heat for the symptoms. The treatment provided mild relief.   Woke-up Saturday morning with left side of neck hurting. pain has been constant. Some pain in upper back and back of neck. Difficult to turn his head.     Allergies  Allergen Reactions  . Lipitor [Atorvastatin Calcium]   . Nsaids   . Statins    Previous Medications   ALLOPURINOL (ZYLOPRIM) 100 MG TABLET    Take 2 tablets (200 mg total) by mouth daily.   AMLODIPINE (NORVASC) 2.5 MG TABLET    Take 1 tablet (2.5 mg total) by mouth as needed.   CITALOPRAM (CELEXA) 10 MG TABLET    Take 0.5 tablets (5 mg total) by mouth 2 (two) times daily.   COLCHICINE (MITIGARE) 0.6 MG CAPS    Two tablets today, then 1 tablet daily   DIPYRIDAMOLE-ASPIRIN (AGGRENOX) 200-25 MG 12HR CAPSULE    TAKE 1 CAPSULE TWICE A DAY   ESOMEPRAZOLE (NEXIUM) 40 MG CAPSULE    Take 40 mg by mouth daily.    FISH OIL-OMEGA-3 FATTY ACIDS 1000 MG CAPSULE    Take 1,200 mg by mouth daily.    FLUTICASONE (FLONASE) 50 MCG/ACT NASAL SPRAY     instill 2 sprays into each nostril once daily   MULTIPLE VITAMIN (MULTIVITAMIN) TABLET    Take 1 tablet by mouth daily.     NITROGLYCERIN (NITROSTAT) 0.4 MG SL TABLET    Place 1 tablet (0.4 mg total) under the tongue every 5 (five) minutes as needed for chest pain.   ONDANSETRON (ZOFRAN) 4 MG TABLET    Take 1 tablet (4 mg total) by mouth daily as needed for nausea or vomiting.   PROBIOTIC PRODUCT (ALIGN) 4 MG CAPS    Take 4 mg by mouth daily.   SODIUM BICARBONATE 650 MG TABLET    Take 650 mg by mouth 2 (two) times daily.   VITAMIN D, ERGOCALCIFEROL, (DRISDOL) 50000 UNITS CAPS CAPSULE    TAKE 1 CAPSULE ONCE A WEEK   WELCHOL 625 MG TABLET    TAKE 3 TABLETS TWICE A DAY    Review of Systems  Constitutional: Negative for fever, chills and appetite change.  HENT: Negative for trouble swallowing.   Eyes: Negative for photophobia.  Respiratory: Negative for chest tightness, shortness of breath and wheezing.   Cardiovascular: Negative for chest pain, palpitations and syncope.  Gastrointestinal: Negative for nausea, vomiting and abdominal pain.  Musculoskeletal: Positive for back pain, neck pain and neck stiffness.  Neurological: Positive for tingling and  weakness. Negative for numbness and headaches.    Social History  Substance Use Topics  . Smoking status: Never Smoker   . Smokeless tobacco: Not on file  . Alcohol Use: No   Objective:   BP 120/80 mmHg  Pulse 87  Temp(Src) 98.3 F (36.8 C) (Oral)  Resp 16  Wt 169 lb (76.658 kg)  SpO2 100%  Physical Exam  General appearance: alert, well developed, well nourished, cooperative and in no distress Head: Normocephalic, without obvious abnormality, atraumatic Lungs: Respirations even and unlabored Extremities: Moderate point tenderness left proximal SCM. Unable to rotatate, extend or flex neck due to pain on side of neck.   Neurologic: Mental status: Alert, oriented to person, place, and time, thought content appropriate.       Assessment & Plan:     1. Neck pain on left side Generally avoid oral NSAIDs due to CKD. He has responded well to occasional use of IM toradol which was given today.  - ketorolac (TORADOL) injection 60 mg; Inject 2 mLs (60 mg total) into the muscle once.  2. Syncope, unspecified syncope type  - EKG 12-Lead  After ketorolac injection, patient became very weak and could not walk on his own. He subsequently sat back in chair and lost consciousness for about 2 minutes. He maintained heart rate in the 50s and 60s throughout episodes. He would respond briefly to simple questions but would not open his eyes. He only complained of nausea during this time. He specifically denies chest pain, dyspnea, abdominal pain and headache. His breathing was shallow but did not cease. His lowest O2 saturating was 87% just prior to being placed on 2lpm oxygen and his oxygen rapidly rebounded to 99%. About 3 minutes into this episode he vomited billous material which he repeated 3-4 times over the next several minutes. EKG was remarkable for old LBBB. Cardiac monitoring showed intermittent episodes ST depression in lead I and sinus rhythm throughout episode. He was not given aspirin due to recurrent vomiting and altered mental status. He was transported to ER in a semi-conscious state by EMS.   Of note is that his wife states today's episode was nearly identical to episode that occurred when he was at his granddaughter's birthday party in November.        [name], MD  [facility]

## 2015-10-15 NOTE — ED Notes (Signed)
Pt at md office today for neck pain since Saturday. Per pt md gave him shot of toradol, pt stood up, started to walk, felt weak and passed out.

## 2015-10-15 NOTE — Discharge Instructions (Signed)
Without ct scan and imaging of that variety I cannot tell you why your neck hurts. However, the rest of your work up here was reassuring. Please return to the emergency department if you have any new or worrisome symptoms and follow up closely with your pcp.

## 2015-10-15 NOTE — ED Notes (Signed)
Received report from [place] rn.

## 2015-10-15 NOTE — Patient Instructions (Signed)
Call for prednisone prescription if not greatly improved within 24 hours, or if symptoms flare up after initial improvement.

## 2015-10-15 NOTE — ED Provider Notes (Addendum)
[facility] Emergency Department Provider Note  ____________________________________________   I have reviewed the triage vital signs and the nursing notes.   HISTORY  Chief Complaint Loss of Consciousness    HPI Keith Woods is a 82 y.o. male with a history of prior syncopal events, hypertension, CAD which is apparently nonobstructive by most recent catheterization2008, TIAs in the past history of prostate cancer hyperlipidemia left bundle-branch block. Patient has chronic kidney disease. In any event he is here because he has had neck pain since Saturday. Today is Monday. He woke up with it. Felt like he slept wrong. States it only hurts when he moves at this point. He was wrong way however he has neck pain. Mostly on the right although he is very vague about where exactly it hurts. States "it depends how I move". Patient is disinclined to offer a lot of information and has to be prodded multiple times to provide a history. He answers in a very limited but concise manner. He does not appear to be altered. In any event, he went to the doctor's office for this pain and received a shot of Toradol and his buttocks. He stood up from a shot of Toradol and then passed out. Prior to that he had not had any chest pain shortness of breath nausea vomiting headache diarrhea numbness or weakness. He states he felt and feels that this time his baseline except for his neck hurts when he moves it the wrong way. He denies fever. Family, at bedside, stated he is at his baseline, does not like shots.  Past Medical History  Diagnosis Date  . Syncope and collapse   . Hypertension   . Coronary artery disease     NONOBSTRUCTIVE by cath 2008  . History of transient ischemic attack (TIA)   . GERD (gastroesophageal reflux disease)   . History of prostate cancer   . Hyperlipidemia   . LBBB (left bundle branch block)     Patient Active Problem List   Diagnosis Date Noted  .  Syncope 10/15/2015  . Gout 08/15/2015  . Fatigue 07/08/2015  . Right leg pain 05/18/2015  . Acute gout 05/18/2015  . Wrist pain 03/15/2015  . Chronic kidney disease 02/02/2015  . Vertigo 02/02/2015  . Hearing loss 02/02/2015  . H/O malignant neoplasm of prostate 12/06/2012  . LBBB (left bundle branch block)   . Hypertension   . Coronary artery disease   . Hyperlipidemia     Past Surgical History  Procedure Laterality Date  . Cardiac catheterization  08/28/2006    EF 60%  . Tonsillectomy    . Transthoracic echocardiogram  10/10/2008    EF 55%    Current Outpatient Rx  Name  Route  Sig  Dispense  Refill  . allopurinol (ZYLOPRIM) 100 MG tablet   Oral   Take 2 tablets (200 mg total) by mouth daily.   60 tablet   3   . amLODipine (NORVASC) 2.5 MG tablet   Oral   Take 1 tablet (2.5 mg total) by mouth as needed.   90 tablet   4   . citalopram (CELEXA) 10 MG tablet   Oral   Take 0.5 tablets (5 mg total) by mouth 2 (two) times daily.   30 tablet   11   . Colchicine (MITIGARE) 0.6 MG CAPS      Two tablets today, then 1 tablet daily Patient taking differently: 1 capsule daily. One capsule daily for three days every 2  weeks   30 capsule   3   . cyclobenzaprine (FLEXERIL) 5 MG tablet   Oral   Take 1 tablet (5 mg total) by mouth at bedtime.   10 tablet   0   . dipyridamole-aspirin (AGGRENOX) 200-25 MG 12hr capsule      TAKE 1 CAPSULE TWICE A DAY   180 capsule   3   . esomeprazole (NEXIUM) 40 MG capsule   Oral   Take 40 mg by mouth daily.          . fish oil-omega-3 fatty acids 1000 MG capsule   Oral   Take 1,200 mg by mouth daily.          . Multiple Vitamin (MULTIVITAMIN) tablet   Oral   Take 1 tablet by mouth daily.           . nitroGLYCERIN (NITROSTAT) 0.4 MG SL tablet   Sublingual   Place 1 tablet (0.4 mg total) under the tongue every 5 (five) minutes as needed for chest pain.   100 tablet   3   . Probiotic Product (ALIGN) 4 MG CAPS   Oral    Take 4 mg by mouth daily.         . sodium bicarbonate 650 MG tablet   Oral   Take 650 mg by mouth 2 (two) times daily.      0   . WELCHOL 625 MG tablet      TAKE 3 TABLETS TWICE A DAY   540 tablet   4   . fluticasone (FLONASE) 50 MCG/ACT nasal spray      instill 2 sprays into each nostril once daily      0   . ondansetron (ZOFRAN) 4 MG tablet   Oral   Take 1 tablet (4 mg total) by mouth daily as needed for nausea or vomiting.   10 tablet   0   . Vitamin D, Ergocalciferol, (DRISDOL) 50000 units CAPS capsule      TAKE 1 CAPSULE ONCE A WEEK   12 capsule   4     Allergies Lipitor; Nsaids; and Statins  Family History  Problem Relation Age of Onset  . Ovarian cancer Mother   . Prostate cancer Father     Social History Social History  Substance Use Topics  . Smoking status: Never Smoker   . Smokeless tobacco: None  . Alcohol Use: No    Review of Systems Constitutional: No fever/chills Eyes: No visual changes. ENT: No sore throat. No stiff neck no neck pain Cardiovascular: Denies chest pain. Respiratory: Denies shortness of breath. Gastrointestinal:   no vomiting.  No diarrhea.  No constipation. Genitourinary: Negative for dysuria. Musculoskeletal: Negative lower extremity swelling Skin: Negative for rash. Neurological: Negative for headaches, focal weakness or numbness. 10-point ROS otherwise negative.  ____________________________________________   PHYSICAL EXAM:  VITAL SIGNS: ED Triage Vitals  Enc Vitals Group     BP 10/15/15 1247 130/78 mmHg     Pulse Rate 10/15/15 1203 65     Resp 10/15/15 1247 18     Temp 10/15/15 1203 96.1 F (35.6 C)     Temp Source 10/15/15 1203 Oral     SpO2 10/15/15 1203 96 %     Weight 10/15/15 1203 170 lb (77.111 kg)     Height 10/15/15 1203 6\' 3"  (1.905 m)     Head Cir --      Peak Flow --      Pain Score --  Pain Loc --      Pain Edu? --      Excl. in [place]? --     Constitutional: Alert and oriented.  Well appearing and in no acute distress. Eyes: Conjunctivae are normal. PERRL. EOMI. Head: Atraumatic. Nose: No congestion/rhinnorhea. Mouth/Throat: Mucous membranes are dry.  Oropharynx non-erythematous. Neck: No stridor.   Patient refuses to move his neck from a but there is no evidence of acute fracture. When I palpate the spine it does not appear to cause him discomfort and he states it is not tender. He does have some minimal tenderness to palpation in the trapezius muscles mostly on the right. Cardiovascular: Normal rate, regular rhythm. Grossly normal heart sounds.  Good peripheral circulation. Respiratory: Normal respiratory effort.  No retractions. Lungs CTAB. Abdominal: Soft and nontender. No distention. No guarding no rebound Back:  There is no focal tenderness or step off there is no midline tenderness there are no lesions noted. there is no CVA tenderness Musculoskeletal: No lower extremity tenderness. No joint effusions, no DVT signs strong distal pulses no edema Neurologic:  Normal speech and language. No gross focal neurologic deficits are appreciated.  Skin:  Skin is warm, dry and intact. No rash noted. Psychiatric: Mood and affect are normal. Speech and behavior are normal.  ____________________________________________   LABS (all labs ordered are listed, but only abnormal results are displayed)  Labs Reviewed  COMPREHENSIVE METABOLIC PANEL - Abnormal; Notable for the following:    CO2 19 (*)    Glucose, Bld 135 (*)    BUN 28 (*)    Creatinine, Ser 2.18 (*)    Albumin 3.4 (*)    GFR calc non Af Amer 25 (*)    GFR calc Af Amer 29 (*)    All other components within normal limits  PROTIME-INR - Abnormal; Notable for the following:    Prothrombin Time 15.7 (*)    All other components within normal limits  LIPASE, BLOOD  TROPONIN I  CBC WITH DIFFERENTIAL/PLATELET  URINALYSIS COMPLETEWITH MICROSCOPIC ([facility] ONLY)  ETHANOL  CBC WITH DIFFERENTIAL/PLATELET    ____________________________________________  EKG  I personally interpreted any EKGs ordered by me or triage Left bundle-branch block which is not new, heart rate is 66 no change from prior ____________________________________________  RADIOLOGY  I reviewed any imaging ordered by me or triage that were performed during my shift ____________________________________________   PROCEDURES  Procedure(s) performed: None  Critical Care performed: None  ____________________________________________   INITIAL IMPRESSION / ASSESSMENT AND PLAN / ED COURSE  Pertinent labs & imaging results that were available during my care of the patient were reviewed by me and considered in my medical decision making (see chart for details).  Patient had a syncopal event after an injection in his doctor's office for what could be torticollis. I have offered and advised that we obtain imaging of his neck for his neck pain and he adamantly refuses. He states his doctor didn't order it therefore does not necessary. He also declined CT of his head. He understands the limitations as places upon me in terms of diagnosing the cause of his neck pain. When I Marcello Moores means that I will not likely be able to figure out why his neck hurts aside from it. She likely muscle pain, patient states "I will forgive you". In any event, he does not wish any further imaging. His neck pain he states is better since the Toradol shot. He is neurologically intact to the extent that he purchase a  pains in the exam. He does appear to be somewhat dehydrated will give him IV fluid we'll check basic blood work chest x-ray and reassess for the syncopal event which sounds as if it was a reaction to the shot.  ----------------------------------------- 2:29 PM on 10/15/2015 -----------------------------------------  Patient remains awake alert oriented in no acute distress she still declines any further imaging. He does state he will allow [place]  to get a urine sample. We have given him IV fluid, he states he has no complaints and feels just fine. He would like to be discharged. He declines offered admission. Family at bedside.  ----------------------------------------- 6:20 PM on 10/15/2015 -----------------------------------------  Age with no complaints, declines further workup and serial troponins are negative pressures are stable sats are good neurologically intact eager to go home neck pain is nearly gone after the Toradol shot will painless range of motion no evidence of ongoing pathology, refuses admission we will discharge   ____________________________________________   FINAL CLINICAL IMPRESSION(S) / ED DIAGNOSES  Final diagnoses:  None      This chart was dictated using voice recognition software.  Despite best efforts to proofread,  errors can occur which can change meaning.     Schuyler Amor, MD 10/15/15 1307  Schuyler Amor, MD 10/15/15 [place], MD 10/15/15 [phone]

## 2015-10-16 ENCOUNTER — Telehealth: Payer: Self-pay | Admitting: Family Medicine

## 2015-10-16 MED ORDER — PREDNISONE 20 MG PO TABS: ORAL_TABLET | ORAL | Status: AC

## 2015-10-16 NOTE — Telephone Encounter (Signed)
Pt states he is still having pain in his neck.  Pt states it is a lot better.  Pt is requesting a Rx to help with this.  [place].  [phone]

## 2015-10-16 NOTE — Telephone Encounter (Signed)
Have sent prescription for low dose of prednisone to [facility]. He can take this once or twice a day until pain is gone. Can also take flexeril at bedtime, which was sent to pharmacy yesterday.

## 2015-10-16 NOTE — Telephone Encounter (Signed)
Patient was notified. Patient expressed understanding.  

## 2015-10-31 ENCOUNTER — Ambulatory Visit (INDEPENDENT_AMBULATORY_CARE_PROVIDER_SITE_OTHER): Payer: Medicare Other | Admitting: Family Medicine

## 2015-10-31 ENCOUNTER — Encounter: Payer: Self-pay | Admitting: Family Medicine

## 2015-10-31 VITALS — BP 140/84 | HR 104 | Temp 97.9°F | Resp 16 | Wt 174.0 lb

## 2015-10-31 DIAGNOSIS — R55 Syncope and collapse: Secondary | ICD-10-CM

## 2015-10-31 DIAGNOSIS — R251 Tremor, unspecified: Secondary | ICD-10-CM | POA: Diagnosis not present

## 2015-10-31 DIAGNOSIS — Z125 Encounter for screening for malignant neoplasm of prostate: Secondary | ICD-10-CM

## 2015-10-31 DIAGNOSIS — R531 Weakness: Secondary | ICD-10-CM | POA: Diagnosis not present

## 2015-10-31 DIAGNOSIS — E559 Vitamin D deficiency, unspecified: Secondary | ICD-10-CM

## 2015-10-31 DIAGNOSIS — D649 Anemia, unspecified: Secondary | ICD-10-CM | POA: Diagnosis not present

## 2015-10-31 DIAGNOSIS — R5383 Other fatigue: Secondary | ICD-10-CM

## 2015-10-31 NOTE — Patient Instructions (Signed)
May put on allopurinol on hold for one week to see if energy level improves

## 2015-10-31 NOTE — Progress Notes (Signed)
Patient: Keith Woods Male    DOB: Sep 23, 1925   82 y.o.   MRN: [id number] Visit Date: 10/31/2015  Today's Provider: [name], MD   Chief Complaint  Patient presents with  . Fatigue   Subjective:    HPI  Fatigue for several months. Weakness in legs and in his voice. Feels tried all the time. Lately symptoms have gotten worse. Has been having more trouble with balance and getting up from a sitting position. He was seen 10/15/15 with neck pain and had vasovagal syncope with vomiting after receiving injection of ketorolac. Was transferred to ER where he returned to baseline and had normal labs aside from changes of chronic renal insufficiecny. He had similar syncopal episode at [facility] in November and had normal head CT at that time. He was started on allopurinol several months ago for elevated uric acid associated with severe wrist pain which his orthopedist thought was gout.   His wife has noticed a mild, but increasing tremor in his hands, primarily with use of his hands. Rarely at rest. He only takes amlodipine when his BP gets above 160 which is rare. No other recent medication changes.       Wt Readings from Last 3 Encounters:  10/31/15 174 lb (78.926 kg)  10/15/15 170 lb (77.111 kg)  10/15/15 169 lb (76.658 kg)   Past Medical History  Diagnosis Date  . Syncope and collapse   . Hypertension   . Coronary artery disease     NONOBSTRUCTIVE by cath 2008  . History of transient ischemic attack (TIA)   . GERD (gastroesophageal reflux disease)   . History of prostate cancer   . Hyperlipidemia   . LBBB (left bundle branch block)      Allergies  Allergen Reactions  . Lipitor [Atorvastatin Calcium]   . Nsaids   . Statins    Previous Medications   ALLOPURINOL (ZYLOPRIM) 100 MG TABLET    Take 2 tablets (200 mg total) by mouth daily.   AMLODIPINE (NORVASC) 2.5 MG TABLET    Take 1 tablet (2.5 mg total) by mouth as needed.   CITALOPRAM (CELEXA) 10 MG  TABLET    Take 0.5 tablets (5 mg total) by mouth 2 (two) times daily.   COLCHICINE (MITIGARE) 0.6 MG CAPS    Two tablets today, then 1 tablet daily   DIPYRIDAMOLE-ASPIRIN (AGGRENOX) 200-25 MG 12HR CAPSULE    TAKE 1 CAPSULE TWICE A DAY   ESOMEPRAZOLE (NEXIUM) 40 MG CAPSULE    Take 40 mg by mouth daily.    FISH OIL-OMEGA-3 FATTY ACIDS 1000 MG CAPSULE    Take 1,200 mg by mouth daily.    FLUTICASONE (FLONASE) 50 MCG/ACT NASAL SPRAY    instill 2 sprays into each nostril once daily   MULTIPLE VITAMIN (MULTIVITAMIN) TABLET    Take 1 tablet by mouth daily.     NITROGLYCERIN (NITROSTAT) 0.4 MG SL TABLET    Place 1 tablet (0.4 mg total) under the tongue every 5 (five) minutes as needed for chest pain.   ONDANSETRON (ZOFRAN) 4 MG TABLET    Take 1 tablet (4 mg total) by mouth daily as needed for nausea or vomiting.   PROBIOTIC PRODUCT (ALIGN) 4 MG CAPS    Take 4 mg by mouth daily.   SODIUM BICARBONATE 650 MG TABLET    Take 650 mg by mouth 2 (two) times daily.   VITAMIN D, ERGOCALCIFEROL, (DRISDOL) 50000 UNITS CAPS CAPSULE    TAKE  1 CAPSULE ONCE A WEEK   WELCHOL 625 MG TABLET    TAKE 3 TABLETS TWICE A DAY    Review of Systems  Constitutional: Positive for fatigue. Negative for fever, chills and appetite change.  Respiratory: Negative for chest tightness, shortness of breath and wheezing.   Cardiovascular: Negative for chest pain and palpitations.  Gastrointestinal: Negative for nausea, vomiting and abdominal pain.  Musculoskeletal: Positive for gait problem.  Neurological: Positive for weakness. Negative for dizziness and light-headedness.    Social History  Substance Use Topics  . Smoking status: Never Smoker   . Smokeless tobacco: Not on file  . Alcohol Use: No   Objective:   BP 140/84 mmHg  Pulse 104  Temp(Src) 97.9 F (36.6 C) (Oral)  Resp 16  Wt 174 lb (78.926 kg)  SpO2 99%  Physical Exam   General Appearance:    Alert, cooperative, no distress  Eyes:    PERRL, conjunctiva/corneas  clear, EOM's intact       Lungs:     Clear to auscultation bilaterally, respirations unlabored  Heart:    Regular rate and rhythm  Neurologic:   Awake, alert, oriented x 3. Mild intention tremor of both hands. No cog-wheeling appreciated. Slightly shuffled gait. Stumbles a bit when changing direction when ambulating. +4 muscle strength of legs and arms. Able to stand with difficulty, but without assistance from sitting position.           Assessment & Plan:     1. Other fatigue   2. Vitamin D deficiency  - VITAMIN D 25 Hydroxy (Vit-D Deficiency, Fractures)  3. Anemia, unspecified anemia type  - Vitamin B12 - Folate - Ferritin - Sedimentation rate  4. Weakness  - CK (Creatine Kinase)  5. Tremor   6. Vasovagal syncope   7. Prostate cancer screening  - PSA   He read that fatigue is a side effect of allopurinol, so he can stop that for the next week to see if it helps. He does have intention tremor, LE weakness and slightly shuffled gait. He has also had 2 episodes of presumed vaso-vagal syncope in the last 4 months, which he never had in the past. Will consider referral neurology for further evaluation if labs are unremarkable.        [name], MD  [facility]

## 2015-11-01 ENCOUNTER — Telehealth: Payer: Self-pay | Admitting: *Deleted

## 2015-11-01 DIAGNOSIS — R2689 Other abnormalities of gait and mobility: Secondary | ICD-10-CM

## 2015-11-01 DIAGNOSIS — R29898 Other symptoms and signs involving the musculoskeletal system: Secondary | ICD-10-CM

## 2015-11-01 LAB — PSA: PROSTATE SPECIFIC AG, SERUM: 2.3 ng/mL (ref 0.0–4.0)

## 2015-11-01 LAB — VITAMIN B12: Vitamin B-12: 1393 pg/mL — ABNORMAL HIGH (ref 211–946)

## 2015-11-01 LAB — SEDIMENTATION RATE: SED RATE: 31 mm/h — AB (ref 0–30)

## 2015-11-01 LAB — FERRITIN: Ferritin: 417 ng/mL — ABNORMAL HIGH (ref 30–400)

## 2015-11-01 LAB — VITAMIN D 25 HYDROXY (VIT D DEFICIENCY, FRACTURES): VIT D 25 HYDROXY: 37.9 ng/mL (ref 30.0–100.0)

## 2015-11-01 LAB — CK: Total CK: 24 U/L (ref 24–204)

## 2015-11-01 LAB — FOLATE: Folate: >20 ng/mL (ref >3.0)

## 2015-11-01 NOTE — Telephone Encounter (Signed)
Please refer to neurology. Thanks!

## 2015-11-01 NOTE — Telephone Encounter (Signed)
-----   Message from [name], MD sent at 11/01/2015  7:56 AM EDT ----- Labs are all normal. Recommend proceed with referral to neurology for LE weakness and balance difficulties. Can stop allopurinol for the next week to see if that helps with energy level.

## 2015-11-01 NOTE — Telephone Encounter (Signed)
Patient was notified of results. Patient expressed understanding. Referral in epic. 

## 2015-11-05 DIAGNOSIS — G252 Other specified forms of tremor: Secondary | ICD-10-CM | POA: Diagnosis not present

## 2015-11-05 DIAGNOSIS — R6889 Other general symptoms and signs: Secondary | ICD-10-CM | POA: Diagnosis not present

## 2015-11-05 DIAGNOSIS — R2689 Other abnormalities of gait and mobility: Secondary | ICD-10-CM | POA: Diagnosis not present

## 2015-11-05 DIAGNOSIS — M25521 Pain in right elbow: Secondary | ICD-10-CM | POA: Diagnosis not present

## 2015-11-05 DIAGNOSIS — R29898 Other symptoms and signs involving the musculoskeletal system: Secondary | ICD-10-CM | POA: Diagnosis not present

## 2015-11-06 ENCOUNTER — Encounter: Payer: Self-pay | Admitting: Family Medicine

## 2015-11-06 ENCOUNTER — Ambulatory Visit (INDEPENDENT_AMBULATORY_CARE_PROVIDER_SITE_OTHER): Payer: Medicare Other | Admitting: Family Medicine

## 2015-11-06 VITALS — BP 124/62 | HR 80 | Temp 98.0°F | Resp 18 | Wt 172.0 lb

## 2015-11-06 DIAGNOSIS — F05 Delirium due to known physiological condition: Secondary | ICD-10-CM | POA: Diagnosis not present

## 2015-11-06 DIAGNOSIS — M542 Cervicalgia: Secondary | ICD-10-CM | POA: Diagnosis not present

## 2015-11-06 DIAGNOSIS — M7021 Olecranon bursitis, right elbow: Secondary | ICD-10-CM

## 2015-11-06 MED ORDER — CEPHALEXIN 500 MG PO CAPS: 500.0000 mg | ORAL_CAPSULE | Freq: Four times a day (QID) | ORAL | Status: AC

## 2015-11-06 MED ORDER — PREDNISONE 20 MG PO TABS: ORAL_TABLET | ORAL | Status: DC

## 2015-11-06 NOTE — Progress Notes (Signed)
Patient: Keith Woods Male    DOB: August 16, 1925   82 y.o.   MRN: [id number] Visit Date: 11/06/2015  Today's Provider: [name], MD   Chief Complaint  Patient presents with  . Joint Swelling   Subjective:    HPI  Elbow pain and swelling: Patient reports that he has had pain and swelling in his right elbow for the past 2 weeks. Patient denies recent injury to the elbow.  He has tried apply heat compresses with no relief. It has been red and warm at times. Patient was seen by Dr. Manuella Ghazi yesterday and was advised that he should follow up with his PCP to have his elbow evaluated.   He also has started to have neck pain again. Pain resolved after injection of ketorolac on 10/15/15, but he had syncopal episode after words required transport to ER. He has taken prednisone in the past which was effective.      Allergies  Allergen Reactions  . Lipitor [Atorvastatin Calcium]   . Nsaids   . Statins    Previous Medications   ALLOPURINOL (ZYLOPRIM) 100 MG TABLET    Take 2 tablets (200 mg total) by mouth daily.   AMLODIPINE (NORVASC) 2.5 MG TABLET    Take 1 tablet (2.5 mg total) by mouth as needed.   CITALOPRAM (CELEXA) 10 MG TABLET    Take 0.5 tablets (5 mg total) by mouth 2 (two) times daily.   COLCHICINE (MITIGARE) 0.6 MG CAPS    Two tablets today, then 1 tablet daily   DIPYRIDAMOLE-ASPIRIN (AGGRENOX) 200-25 MG 12HR CAPSULE    TAKE 1 CAPSULE TWICE A DAY   ESOMEPRAZOLE (NEXIUM) 40 MG CAPSULE    Take 40 mg by mouth daily.    FISH OIL-OMEGA-3 FATTY ACIDS 1000 MG CAPSULE    Take 1,200 mg by mouth daily.    FLUTICASONE (FLONASE) 50 MCG/ACT NASAL SPRAY    instill 2 sprays into each nostril once daily   MULTIPLE VITAMIN (MULTIVITAMIN) TABLET    Take 1 tablet by mouth daily.     NITROGLYCERIN (NITROSTAT) 0.4 MG SL TABLET    Place 1 tablet (0.4 mg total) under the tongue every 5 (five) minutes as needed for chest pain.   ONDANSETRON (ZOFRAN) 4 MG TABLET    Take 1 tablet (4 mg total)  by mouth daily as needed for nausea or vomiting.   PROBIOTIC PRODUCT (ALIGN) 4 MG CAPS    Take 4 mg by mouth daily.   SODIUM BICARBONATE 650 MG TABLET    Take 650 mg by mouth 2 (two) times daily.   VITAMIN D, ERGOCALCIFEROL, (DRISDOL) 50000 UNITS CAPS CAPSULE    TAKE 1 CAPSULE ONCE A WEEK   WELCHOL 625 MG TABLET    TAKE 3 TABLETS TWICE A DAY    Review of Systems  Constitutional: Negative for fever, chills and appetite change.  Respiratory: Negative for chest tightness, shortness of breath and wheezing.   Cardiovascular: Negative for chest pain and palpitations.  Gastrointestinal: Negative for nausea, vomiting and abdominal pain.  Musculoskeletal: Positive for joint swelling (right elbow), arthralgias (right elbow) and neck pain (left side).    Social History  Substance Use Topics  . Smoking status: Never Smoker   . Smokeless tobacco: Not on file  . Alcohol Use: No   Objective:   BP 124/62 mmHg  Pulse 80  Temp(Src) 98 F (36.7 C) (Oral)  Resp 18  Wt 172 lb (78.019 kg)  Physical Exam  General appearance: alert, well developed, well nourished, cooperative and in no distress Head: Normocephalic, without obvious abnormality, atraumatic Lungs: Respirations even and unlabored Extremities: Moderate tenderness, minimal swelling over right olecranon. No erythema.      Assessment & Plan:     1. Olecranon bursitis of right elbow Minimal swelling today, doubt we would be able to aspirate anything. It has been red and is slightly warm to touch. Will cover with abx.  - cephALEXin (KEFLEX) 500 MG capsule; Take 1 capsule (500 mg total) by mouth 4 (four) times daily.  Dispense: 28 capsule; Refill: 0  2. Neck pain on left side Resolved with Toradol injection a few weeks ago, but we want to minimize NSAID exposure due to CKD and he hand syncopal episode after last injection.  Try brief course of prednisone.  - predniSONE (DELTASONE) 20 MG tablet; One tablet once or twice a day as needed   Dispense: 20 tablet; Refill: 0       [name], MD  [facility]

## 2015-11-09 ENCOUNTER — Other Ambulatory Visit: Payer: Self-pay | Admitting: Neurology

## 2015-11-09 DIAGNOSIS — R2689 Other abnormalities of gait and mobility: Secondary | ICD-10-CM

## 2015-11-12 ENCOUNTER — Telehealth: Payer: Self-pay | Admitting: Family Medicine

## 2015-11-12 NOTE — Telephone Encounter (Signed)
Pt stated that his neurologist started him on Carbidopa-Levodopa on 11/08/15 and would like to be advised of a good laxative. Pt stated his last BM was on 11/09/15 and it was very hard. Please advise. Thanks TNP

## 2015-11-12 NOTE — Telephone Encounter (Signed)
Patient notified

## 2015-11-12 NOTE — Telephone Encounter (Signed)
OTC Miralax works the best.

## 2015-11-12 NOTE — Telephone Encounter (Signed)
Please advise 

## 2015-11-26 ENCOUNTER — Encounter: Payer: Self-pay | Admitting: Family Medicine

## 2015-11-26 ENCOUNTER — Ambulatory Visit (INDEPENDENT_AMBULATORY_CARE_PROVIDER_SITE_OTHER): Payer: Medicare Other | Admitting: Family Medicine

## 2015-11-26 VITALS — BP 110/60 | HR 83 | Temp 97.8°F | Resp 18 | Wt 174.0 lb

## 2015-11-26 DIAGNOSIS — M542 Cervicalgia: Secondary | ICD-10-CM

## 2015-11-26 DIAGNOSIS — M353 Polymyalgia rheumatica: Secondary | ICD-10-CM

## 2015-11-26 DIAGNOSIS — M79661 Pain in right lower leg: Secondary | ICD-10-CM

## 2015-11-26 MED ORDER — PREDNISONE 10 MG PO TABS: ORAL_TABLET | ORAL | Status: DC

## 2015-11-26 NOTE — Progress Notes (Signed)
Patient: Keith Woods Male    DOB: 1926/04/30   82 y.o.   MRN: [id number] Visit Date: 11/26/2015  Today's Provider: [name], MD   Chief Complaint  Patient presents with  . Neck Pain  . Leg Pain   Subjective:    Neck Pain  This is a recurrent problem. The current episode started 1 to 4 weeks ago. The problem occurs intermittently. The problem has been gradually worsening. Exacerbated by: motion. Associated symptoms include leg pain and weakness. Pertinent negatives include no chest pain, fever, headaches, numbness, pain with swallowing, photophobia, syncope, tingling, trouble swallowing, visual change or weight loss. Treatments tried: prednisone. Patient seen 11/06/2015 for left side neck pain. Pain resolved but then retured 2-3 weeks ago and is now on the right side. The treatment provided significant relief.  Leg Pain  Incident onset: 10 days ago. There was no injury mechanism. Pain location: right calf. The quality of the pain is described as aching. The pain has been intermittent since onset. Associated symptoms include muscle weakness. Pertinent negatives include no numbness or tingling. The symptoms are aggravated by movement.  Pain in calf goes away at rest but start as soon as he stands and gets worse when he walks. It is not a burning sensation and does not extend into his feet.  He had extensive labs checked on 10-31-15 which was only remarkable for slightly elevated sed rate. He is also being followed by Dr. Manuella Ghazi for LE weakness and possible Parkinson's.     Allergies  Allergen Reactions  . Lipitor [Atorvastatin Calcium]   . Nsaids   . Statins    Previous Medications   ALLOPURINOL (ZYLOPRIM) 100 MG TABLET    Take 2 tablets (200 mg total) by mouth daily.   AMLODIPINE (NORVASC) 2.5 MG TABLET    Take 1 tablet (2.5 mg total) by mouth as needed.   CARBIDOPA-LEVODOPA (SINEMET IR) 25-100 MG TABLET    Take 1 tablet by mouth 3 (three) times daily.   CITALOPRAM  (CELEXA) 10 MG TABLET    Take 0.5 tablets (5 mg total) by mouth 2 (two) times daily.   COLCHICINE (MITIGARE) 0.6 MG CAPS    Two tablets today, then 1 tablet daily   DIPYRIDAMOLE-ASPIRIN (AGGRENOX) 200-25 MG 12HR CAPSULE    TAKE 1 CAPSULE TWICE A DAY   ESOMEPRAZOLE (NEXIUM) 40 MG CAPSULE    Take 40 mg by mouth daily.    FISH OIL-OMEGA-3 FATTY ACIDS 1000 MG CAPSULE    Take 1,200 mg by mouth daily.    FLUTICASONE (FLONASE) 50 MCG/ACT NASAL SPRAY    instill 2 sprays into each nostril once daily   MULTIPLE VITAMIN (MULTIVITAMIN) TABLET    Take 1 tablet by mouth daily.     NITROGLYCERIN (NITROSTAT) 0.4 MG SL TABLET    Place 1 tablet (0.4 mg total) under the tongue every 5 (five) minutes as needed for chest pain.   ONDANSETRON (ZOFRAN) 4 MG TABLET    Take 1 tablet (4 mg total) by mouth daily as needed for nausea or vomiting.   PREDNISONE (DELTASONE) 20 MG TABLET    One tablet once or twice a day as needed   PROBIOTIC PRODUCT (ALIGN) 4 MG CAPS    Take 4 mg by mouth daily.   SODIUM BICARBONATE 650 MG TABLET    Take 650 mg by mouth 2 (two) times daily.   VITAMIN D, ERGOCALCIFEROL, (DRISDOL) 50000 UNITS CAPS CAPSULE    TAKE 1 CAPSULE  ONCE A WEEK   WELCHOL 625 MG TABLET    TAKE 3 TABLETS TWICE A DAY    Review of Systems  Constitutional: Positive for fatigue. Negative for fever, chills, weight loss and appetite change.  HENT: Negative for trouble swallowing.   Eyes: Negative for photophobia.  Respiratory: Negative for chest tightness, shortness of breath and wheezing.   Cardiovascular: Negative for chest pain, palpitations and syncope.  Gastrointestinal: Negative for nausea, vomiting and abdominal pain.  Musculoskeletal: Positive for myalgias (pain in right calf) and neck pain.  Neurological: Positive for weakness. Negative for tingling, numbness and headaches.    Social History  Substance Use Topics  . Smoking status: Never Smoker   . Smokeless tobacco: Not on file  . Alcohol Use: No    Objective:   BP 110/60 mmHg  Pulse 83  Temp(Src) 97.8 F (36.6 C) (Oral)  Resp 18  Wt 174 lb (78.926 kg)  SpO2 93%  Physical Exam  General appearance: alert, well developed, well nourished, cooperative and in no distress Head: Normocephalic, without obvious abnormality, atraumatic MS: Very tender along musculature of left lateral neck. No calf tenderness. No gross deformities. No cords.  Psych: Appropriate mood and affect.      Assessment & Plan:     1. Neck pain on right side Had similar pain on left a few weeks ago which resolved after taking antiinflammatory medications.  - predniSONE (DELTASONE) 10 MG tablet; Take 2 tablets daily for one week, then reduce to one tablet daily  Dispense: 30 tablet; Refill: 1  2. Calf pain, right Is not really consistent with claudications. I think this is part of migrating muscle pains, possible PMR as above.   3. Polymyalgia rheumatica (HCC) Suspected, although recent sed rate was only minimally elevated. Will try extended course of prednisone as above  He is to follow up in 2-3 weeks to evaluate response to prednisone.        [name], MD  [facility]

## 2015-11-29 ENCOUNTER — Other Ambulatory Visit: Payer: Self-pay | Admitting: Family Medicine

## 2015-11-29 DIAGNOSIS — M109 Gout, unspecified: Secondary | ICD-10-CM

## 2015-11-29 MED ORDER — ALLOPURINOL 100 MG PO TABS: 200.0000 mg | ORAL_TABLET | Freq: Every day | ORAL | Status: DC

## 2015-11-29 NOTE — Telephone Encounter (Signed)
Pt contacted office for refill request on the following medications: allopurinol (ZYLOPRIM) 100 MG tablet to [facility].   Last written: 07/20/15 Last OV: 11/26/15 Please advise. Thanks TNP

## 2015-12-05 ENCOUNTER — Ambulatory Visit
Admission: RE | Admit: 2015-12-05 | Discharge: 2015-12-05 | Disposition: A | Discharge status: Home / Self Care | Payer: Medicare Other | Source: Ambulatory Visit | Attending: Neurology | Admitting: Neurology

## 2015-12-05 DIAGNOSIS — I6782 Cerebral ischemia: Secondary | ICD-10-CM | POA: Insufficient documentation

## 2015-12-05 DIAGNOSIS — G252 Other specified forms of tremor: Secondary | ICD-10-CM | POA: Diagnosis not present

## 2015-12-05 DIAGNOSIS — R6889 Other general symptoms and signs: Secondary | ICD-10-CM | POA: Insufficient documentation

## 2015-12-05 DIAGNOSIS — R29898 Other symptoms and signs involving the musculoskeletal system: Secondary | ICD-10-CM | POA: Insufficient documentation

## 2015-12-05 DIAGNOSIS — R2689 Other abnormalities of gait and mobility: Secondary | ICD-10-CM | POA: Insufficient documentation

## 2015-12-05 DIAGNOSIS — R531 Weakness: Secondary | ICD-10-CM | POA: Diagnosis not present

## 2015-12-13 ENCOUNTER — Encounter: Payer: Self-pay | Admitting: Family Medicine

## 2015-12-13 ENCOUNTER — Ambulatory Visit (INDEPENDENT_AMBULATORY_CARE_PROVIDER_SITE_OTHER): Payer: Medicare Other | Admitting: Family Medicine

## 2015-12-13 VITALS — BP 140/72 | HR 70 | Temp 97.9°F | Resp 18 | Wt 176.0 lb

## 2015-12-13 DIAGNOSIS — L989 Disorder of the skin and subcutaneous tissue, unspecified: Secondary | ICD-10-CM | POA: Diagnosis not present

## 2015-12-13 DIAGNOSIS — M542 Cervicalgia: Secondary | ICD-10-CM

## 2015-12-13 DIAGNOSIS — M353 Polymyalgia rheumatica: Secondary | ICD-10-CM | POA: Insufficient documentation

## 2015-12-13 DIAGNOSIS — K1379 Other lesions of oral mucosa: Secondary | ICD-10-CM | POA: Diagnosis not present

## 2015-12-13 DIAGNOSIS — M25561 Pain in right knee: Secondary | ICD-10-CM

## 2015-12-13 DIAGNOSIS — I679 Cerebrovascular disease, unspecified: Secondary | ICD-10-CM

## 2015-12-13 DIAGNOSIS — I6789 Other cerebrovascular disease: Secondary | ICD-10-CM

## 2015-12-13 MED ORDER — PREDNISONE 5 MG PO TABS: ORAL_TABLET | ORAL | Status: DC

## 2015-12-13 NOTE — Progress Notes (Signed)
Patient: Keith Woods Male    DOB: 02/09/1926   82 y.o.   MRN: [id number] Visit Date: 12/13/2015  Today's Provider: [name], MD   Chief Complaint  Patient presents with  . Neck Pain    follow up  . Mouth Lesions   Subjective:    HPI  Neck Pain: Patient comes in today for a 3 week follow up of neck pain. Patient was last seen 11/26/2015 for right side neck pain, Polymyalgia Rheumatica and right calf pain. Treatment during that visit includes starting Prednisone 33m. Patient was advised to follow up in 3 weeks to evaluate response to Prednisone. Patient reports good compliance with treatment, good tolerance and good symptom control. Patient states the pain in his neck and left hand has completely resolved.   He states he continues to have persistent pain in right upper leg and knee, but no hip pain. Has not improved at all on prednisone. He want's to know if he can get a cortisone shot. He had XR of hip in October showing moderate OA.   Mouth Lesion: Patient reports he has a pimple on the inside of his bottom lip that appeared 2 weeks ago. Patient states the lesion has grown in size. Patient denies any pain from the lesion.   Skin lesion: Patient has a skin lesion on the dorsal aspect middle finger of his left hand. Patient states it first appeared several months ago after he hit his hand up against a hard surface. Patient reports the lesion has not healed completely. Patient denies any pain or itching.      Allergies  Allergen Reactions  . Lipitor [Atorvastatin Calcium]   . Nsaids   . Statins    Previous Medications   ALLOPURINOL (ZYLOPRIM) 100 MG TABLET    Take 2 tablets (200 mg total) by mouth daily.   AMLODIPINE (NORVASC) 2.5 MG TABLET    Take 1 tablet (2.5 mg total) by mouth as needed.   CARBIDOPA-LEVODOPA (SINEMET IR) 25-100 MG TABLET    Take 1 tablet by mouth 3 (three) times daily.   CITALOPRAM (CELEXA) 10 MG TABLET    Take 0.5 tablets (5 mg total) by  mouth 2 (two) times daily.   COLCHICINE (MITIGARE) 0.6 MG CAPS    Two tablets today, then 1 tablet daily   DIPYRIDAMOLE-ASPIRIN (AGGRENOX) 200-25 MG 12HR CAPSULE    TAKE 1 CAPSULE TWICE A DAY   ESOMEPRAZOLE (NEXIUM) 40 MG CAPSULE    Take 40 mg by mouth daily.    FISH OIL-OMEGA-3 FATTY ACIDS 1000 MG CAPSULE    Take 1,200 mg by mouth daily.    FLUTICASONE (FLONASE) 50 MCG/ACT NASAL SPRAY    instill 2 sprays into each nostril once daily   MULTIPLE VITAMIN (MULTIVITAMIN) TABLET    Take 1 tablet by mouth daily.     NITROGLYCERIN (NITROSTAT) 0.4 MG SL TABLET    Place 1 tablet (0.4 mg total) under the tongue every 5 (five) minutes as needed for chest pain.   ONDANSETRON (ZOFRAN) 4 MG TABLET    Take 1 tablet (4 mg total) by mouth daily as needed for nausea or vomiting.   PREDNISONE (DELTASONE) 10 MG TABLET    Take 2 tablets daily for one week, then reduce to one tablet daily   PROBIOTIC PRODUCT (ALIGN) 4 MG CAPS    Take 4 mg by mouth daily.   SODIUM BICARBONATE 650 MG TABLET    Take 650 mg by mouth 2 (  two) times daily.   VITAMIN D, ERGOCALCIFEROL, (DRISDOL) 50000 UNITS CAPS CAPSULE    TAKE 1 CAPSULE ONCE A WEEK   WELCHOL 625 MG TABLET    TAKE 3 TABLETS TWICE A DAY    Review of Systems  Constitutional: Negative for fever, chills and appetite change.  HENT: Positive for mouth sores (pimple like lesion on the bottom lip; left side).   Respiratory: Negative for chest tightness, shortness of breath and wheezing.   Cardiovascular: Negative for chest pain and palpitations.  Gastrointestinal: Negative for nausea, vomiting and abdominal pain.  Musculoskeletal: Negative for neck pain.  Skin: Positive for wound (skin lesion on middle finger of left hand).    Social History  Substance Use Topics  . Smoking status: Never Smoker   . Smokeless tobacco: Not on file  . Alcohol Use: No   Objective:   BP 140/72 mmHg  Pulse 70  Temp(Src) 97.9 F (36.6 C) (Oral)  Resp 18  Wt 176 lb (79.833 kg)  SpO2  95%  Physical Exam  General appearance: alert, well developed, well nourished, cooperative and in no distress Head: Normocephalic, without obvious abnormality, atraumatic Lungs: Respirations even and unlabored Extremities: No gross deformities Skin: large warty lesion dorsum left third finger. Small mucocele left lip Psych: Appropriate mood and affect. Neurologic: Mental status: Alert, oriented to person, place, and time, thought content appropriate.     Assessment & Plan:     1. Neck pain on right side Resolved on prednsone, suspect PMR based on symptoms and mildly elevated ESR. Will wean to 83m when 176mtablets are finished later this month.  - Ambulatory referral to Orthopedic Surgery - predniSONE (DELTASONE) 5 MG tablet; 1-2 tablets daily  Dispense: 60 tablet; Refill: 0  2. Right knee pain He wants to know if he can have corstisone shot this is limiting his ambulation. Will refer orthopedics for evaluation   3. PMR (polymyalgia rheumatica) (HCC) 5-1079mrednisone as above.   4. Skin lesion Warty lesion that has been present for months. Advised he should have dermatologist evaluate lesion. He is going to call for appointment and let us [place] if he requires referral.   5. Mucocele.       DonLelon HuhD  [facility]

## 2015-12-18 ENCOUNTER — Other Ambulatory Visit: Payer: Self-pay | Admitting: Family Medicine

## 2015-12-26 DIAGNOSIS — G609 Hereditary and idiopathic neuropathy, unspecified: Secondary | ICD-10-CM | POA: Diagnosis not present

## 2015-12-26 DIAGNOSIS — M79651 Pain in right thigh: Secondary | ICD-10-CM | POA: Diagnosis not present

## 2016-01-04 DIAGNOSIS — R809 Proteinuria, unspecified: Secondary | ICD-10-CM | POA: Diagnosis not present

## 2016-01-04 DIAGNOSIS — N2581 Secondary hyperparathyroidism of renal origin: Secondary | ICD-10-CM | POA: Diagnosis not present

## 2016-01-04 DIAGNOSIS — N184 Chronic kidney disease, stage 4 (severe): Secondary | ICD-10-CM | POA: Diagnosis not present

## 2016-01-04 DIAGNOSIS — E872 Acidosis: Secondary | ICD-10-CM | POA: Diagnosis not present

## 2016-01-14 DIAGNOSIS — M79604 Pain in right leg: Secondary | ICD-10-CM | POA: Diagnosis not present

## 2016-01-14 DIAGNOSIS — G20 Parkinson's disease: Secondary | ICD-10-CM | POA: Diagnosis not present

## 2016-01-24 DIAGNOSIS — M79604 Pain in right leg: Secondary | ICD-10-CM | POA: Diagnosis not present

## 2016-02-04 ENCOUNTER — Telehealth: Payer: Self-pay

## 2016-02-04 NOTE — Telephone Encounter (Signed)
Patient's daughter Jenny Reichmann called saying that the patient has fallen 3 times over the weekend. She reports that the patient has no injuries. She reports that the patient did not have any syncopal episodes. She reports that the patient is very weak and his legs give out on him. She reports that symptoms have worsen over the last 3 days. She reports that the patient does feel a little dizzy when moving from sitting to standing position. She reports that the patient does not have dizziness each time he stands, but it is becoming more frequent. Patient was scheduled an appt for evaluation tomorrow at 10am.

## 2016-02-05 ENCOUNTER — Ambulatory Visit (INDEPENDENT_AMBULATORY_CARE_PROVIDER_SITE_OTHER): Payer: Medicare Other | Admitting: Family Medicine

## 2016-02-05 ENCOUNTER — Other Ambulatory Visit: Payer: Self-pay | Admitting: Family Medicine

## 2016-02-05 ENCOUNTER — Encounter: Payer: Self-pay | Admitting: Family Medicine

## 2016-02-05 VITALS — BP 128/70 | HR 78 | Temp 97.8°F | Resp 20

## 2016-02-05 DIAGNOSIS — R5383 Other fatigue: Secondary | ICD-10-CM | POA: Diagnosis not present

## 2016-02-05 DIAGNOSIS — W19XXXA Unspecified fall, initial encounter: Secondary | ICD-10-CM | POA: Diagnosis not present

## 2016-02-05 DIAGNOSIS — R29898 Other symptoms and signs involving the musculoskeletal system: Secondary | ICD-10-CM

## 2016-02-05 NOTE — Progress Notes (Signed)
Subjective:     Patient ID: Keith Woods, male   DOB: [birth year], 82 y.o.   MRN: [id number]  HPI  Chief Complaint  Patient presents with  . Extremity Weakness  . Fall  Currently being evaluated by Dr.Shah, neurology, for his lower extremity weakness-being treated for Parkinsonism with Sinemet. Wife and daughter, Jenny Reichmann, report that over the last few days he has fallen trying to get off the commode as he can't push up with his legs well. Did not sustain injury. Family also reports he has generalized fatigue over the last few days. His wife has noticed changes in his memory and on one occasion was delusional regarding family members present.   Review of Systems     Objective:   Physical Exam  Constitutional: He appears well-developed and well-nourished. No distress (sitting in a wheelchair).  Neck: Carotid bruit is not present.  Cardiovascular: Normal rate and regular rhythm.   Pulmonary/Chest: Breath sounds normal.  Neurological:  Finger to Nose with intention tremors o/w WNL Heel to shin WNL Grip strength 5/5, Muscle strength in lower extremities 5/5 symmetrically. During balance testing his bilateral knees started giving away and he was assisted back into the wheelchair. Tongue is protruded at midline with good lateral movement. + gag  Alert     Assessment:    1. Falls, initial encounter: hx of Parkonsinism - Comprehensive metabolic panel - CBC with Differential/Platelet  2. Weakness of both legs: evaluate for anemia and renal failure. - Comprehensive metabolic panel - CBC with Differential/Platelet    Plan:    Consistently use walker/wheelchair while his weakness is being evaluated. Do follow up with Dr. Manuella Ghazi. Further f/u pending lab work.

## 2016-02-05 NOTE — Patient Instructions (Signed)
Use your walker to get around. We will call you with the lab results. Do follow up with Dr. Manuella Ghazi as he has been working on this leg weakness with you.

## 2016-02-06 LAB — CBC WITH DIFFERENTIAL/PLATELET
BASOS: 0 %
Basophils Absolute: 0 10*3/uL (ref 0.0–0.2)
EOS (ABSOLUTE): 0.1 10*3/uL (ref 0.0–0.4)
EOS: 1 %
HEMATOCRIT: 36.9 % — AB (ref 37.5–51.0)
HEMOGLOBIN: 12.1 g/dL — AB (ref 12.6–17.7)
Immature Grans (Abs): 0 10*3/uL (ref 0.0–0.1)
Immature Granulocytes: 0 %
LYMPHS ABS: 1.5 10*3/uL (ref 0.7–3.1)
Lymphs: 21 %
MCH: 32.2 pg (ref 26.6–33.0)
MCHC: 32.8 g/dL (ref 31.5–35.7)
MCV: 98 fL — ABNORMAL HIGH (ref 79–97)
MONOS ABS: 1.4 10*3/uL — AB (ref 0.1–0.9)
Monocytes: 20 %
NEUTROS PCT: 58 %
Neutrophils Absolute: 4.1 10*3/uL (ref 1.4–7.0)
Platelets: 214 10*3/uL (ref 150–379)
RBC: 3.76 x10E6/uL — ABNORMAL LOW (ref 4.14–5.80)
RDW: 15.3 % (ref 12.3–15.4)
WBC: 7.1 10*3/uL (ref 3.4–10.8)

## 2016-02-06 LAB — COMPREHENSIVE METABOLIC PANEL
A/G RATIO: 1.2 (ref 1.2–2.2)
ALBUMIN: 4 g/dL (ref 3.5–4.7)
ALK PHOS: 97 IU/L (ref 39–117)
ALT: 14 IU/L (ref 0–44)
AST: 30 IU/L (ref 0–40)
BILIRUBIN TOTAL: 0.5 mg/dL (ref 0.0–1.2)
BUN / CREAT RATIO: 14 (ref 10–24)
BUN: 26 mg/dL (ref 8–27)
CHLORIDE: 98 mmol/L (ref 96–106)
CO2: 25 mmol/L (ref 18–29)
Calcium: 9.8 mg/dL (ref 8.6–10.2)
Creatinine, Ser: 1.81 mg/dL — ABNORMAL HIGH (ref 0.76–1.27)
GFR calc Af Amer: 38 mL/min/{1.73_m2} — ABNORMAL LOW (ref >59)
GFR calc non Af Amer: 33 mL/min/{1.73_m2} — ABNORMAL LOW (ref >59)
GLOBULIN, TOTAL: 3.3 g/dL (ref 1.5–4.5)
Glucose: 110 mg/dL — ABNORMAL HIGH (ref 65–99)
POTASSIUM: 4.5 mmol/L (ref 3.5–5.2)
SODIUM: 136 mmol/L (ref 134–144)
Total Protein: 7.3 g/dL (ref 6.0–8.5)

## 2016-02-09 DIAGNOSIS — I639 Cerebral infarction, unspecified: Secondary | ICD-10-CM

## 2016-02-09 HISTORY — DX: Cerebral infarction, unspecified: I63.9

## 2016-02-13 NOTE — Telephone Encounter (Signed)
[email or web address]. sd  

## 2016-02-13 NOTE — Telephone Encounter (Signed)
Returned call to [employer]. Ivin Booty stated that patient has been acting different lately. Patient has also been confused. Patient has had multiple falls. Scheduled ov appt for Friday 02/15/2016.

## 2016-02-13 NOTE — Telephone Encounter (Signed)
Pt's daughter is requesting a call back.  It is concerning the message below.

## 2016-02-13 NOTE — Telephone Encounter (Deleted)
Pt daughter Jenny Reichmann is returning call.  CB#[phone]/MW

## 2016-02-13 NOTE — Telephone Encounter (Signed)
Updated number.

## 2016-02-15 ENCOUNTER — Encounter: Payer: Self-pay | Admitting: Family Medicine

## 2016-02-15 ENCOUNTER — Ambulatory Visit (INDEPENDENT_AMBULATORY_CARE_PROVIDER_SITE_OTHER): Payer: Medicare Other | Admitting: Family Medicine

## 2016-02-15 VITALS — BP 132/72 | HR 74 | Temp 97.5°F | Resp 18 | Wt 179.0 lb

## 2016-02-15 DIAGNOSIS — R296 Repeated falls: Secondary | ICD-10-CM

## 2016-02-15 DIAGNOSIS — R410 Disorientation, unspecified: Secondary | ICD-10-CM | POA: Diagnosis not present

## 2016-02-15 DIAGNOSIS — G20 Parkinson's disease: Secondary | ICD-10-CM | POA: Diagnosis not present

## 2016-02-15 LAB — POCT URINALYSIS DIPSTICK
Bilirubin, UA: NEGATIVE
Glucose, UA: NEGATIVE
Ketones, UA: NEGATIVE
Leukocytes, UA: NEGATIVE
Nitrite, UA: NEGATIVE
PROTEIN UA: NEGATIVE
SPEC GRAV UA: 1.01
UROBILINOGEN UA: 0.2
pH, UA: 6

## 2016-02-15 NOTE — Progress Notes (Signed)
Patient: Keith Woods Male    DOB: 1925-10-13   82 y.o.   MRN: [id number] Visit Date: 02/15/2016  Today's Provider: [name], MD   Chief Complaint  Patient presents with  . Altered Mental Status    follow up   Subjective:    HPI Confusion: Patient presents today with his wife and daughter who state they are here to follow up from last weeks office visit. Patient was seen by Carmon Ginsberg PA-C 1 week ago and labs were done which were stable. He was in his usual state of health until he fell un the bathroom 2 weeks ago. His wife states this occured later in the day just after getting up off of commode. he did not lose consciousness but was very confused for several days after fall. he denies any other recent illnesses. He states his BP had been fine and has not taken amlodipine for a long time. They report that since he was seen here last week he is 99% better, but is still forgetful. He has follow up with Dr. Manuella Ghazi for parkinsons in a week or two. He has had a couple other falls due to trouble with his balance when standing and walking, but none like the fall two weeks ago.    Allergies  Allergen Reactions  . Lipitor [Atorvastatin Calcium]   . Nsaids   . Statins    Current Meds  Medication Sig  . allopurinol (ZYLOPRIM) 100 MG tablet Take 2 tablets (200 mg total) by mouth daily.  Marland Kitchen amLODipine (NORVASC) 2.5 MG tablet Take 1 tablet (2.5 mg total) by mouth as needed.  . carbidopa-levodopa (SINEMET IR) 25-100 MG tablet Take 1 tablet by mouth 3 (three) times daily.  . citalopram (CELEXA) 10 MG tablet Take 0.5 tablets (5 mg total) by mouth 2 (two) times daily.  . Colchicine (MITIGARE) 0.6 MG CAPS Two tablets today, then 1 tablet daily (Patient taking differently: 1 capsule daily. One capsule daily for three days every 2 weeks)  . dipyridamole-aspirin (AGGRENOX) 200-25 MG 12hr capsule TAKE 1 CAPSULE TWICE A DAY  . esomeprazole (NEXIUM) 40 MG capsule TAKE 1 CAPSULE DAILY  .  fish oil-omega-3 fatty acids 1000 MG capsule Take 1,200 mg by mouth daily.   . Multiple Vitamin (MULTIVITAMIN) tablet Take 1 tablet by mouth daily.    . nitroGLYCERIN (NITROSTAT) 0.4 MG SL tablet Place 1 tablet (0.4 mg total) under the tongue every 5 (five) minutes as needed for chest pain.  Marland Kitchen ondansetron (ZOFRAN) 4 MG tablet Take 1 tablet (4 mg total) by mouth daily as needed for nausea or vomiting.  . predniSONE (DELTASONE) 5 MG tablet 1-2 tablets daily  . Probiotic Product (ALIGN) 4 MG CAPS Take 4 mg by mouth daily.  . sodium bicarbonate 650 MG tablet Take 650 mg by mouth 2 (two) times daily.  . Vitamin D, Ergocalciferol, (DRISDOL) 50000 units CAPS capsule TAKE 1 CAPSULE ONCE A WEEK  . WELCHOL 625 MG tablet TAKE 3 TABLETS TWICE A DAY    Review of Systems  Constitutional: Positive for fatigue. Negative for fever, chills and diaphoresis.  Respiratory: Negative for cough, shortness of breath and wheezing.   Cardiovascular: Negative for chest pain, palpitations and leg swelling.  Genitourinary: Negative for dysuria, urgency, frequency, hematuria, flank pain, decreased urine volume, discharge, penile swelling, scrotal swelling, enuresis, difficulty urinating, genital sores, penile pain and testicular pain.  Musculoskeletal: Positive for myalgias (leg pain) and gait problem (off balance).  Neurological:  Positive for weakness.  Psychiatric/Behavioral: Positive for confusion.    Social History  Substance Use Topics  . Smoking status: Never Smoker   . Smokeless tobacco: Not on file  . Alcohol Use: No   Objective:   BP 132/72 mmHg  Pulse 74  Temp(Src) 97.5 F (36.4 C) (Oral)  Resp 18  Wt 179 lb (81.194 kg)  SpO2 98%  Physical Exam   General Appearance:    Alert, cooperative, no distress  Eyes:    PERRL, conjunctiva/corneas clear, EOM's intact       Lungs:     Clear to auscultation bilaterally, respirations unlabored  Heart:    Regular rate and rhythm  Neurologic:   Awake, alert,  oriented to person and place.Marland Kitchen He know month and year, but does thinks it is Monday (it is actually Friday) and thinks the date is the twenty sixth or seventh. Stumles a bit when walking. Cannot perform tandem gait. Rhomberg slightly unsteady, but does not fall to either side. Able to perform finger to nose. Mild resting tremor R>L          Assessment & Plan:     1. Confusion This has mostly resolved and was likely sequela of near syncopal episodes, likely vaso-vagal.  - POCT urinalysis dipstick  2. Frequent falls Multifactorial. Some underlying balance issues, probably related to parkinsonism. I do not think it is safe for him to drive at this time due to balance disorders, confusion, and persistent leg pain (has been evaluated by Dr. Manuella Ghazi). He does not have reliable access to transportation but needs PT. Will start Home health for physical therapy and home safety assessment.   3. Parkinson's disease ([place]) Stable. Follow up Dr. Manuella Ghazi as scheduled.        [name], MD  [facility]

## 2016-02-17 DIAGNOSIS — Z9181 History of falling: Secondary | ICD-10-CM | POA: Diagnosis not present

## 2016-02-17 DIAGNOSIS — G20 Parkinson's disease: Secondary | ICD-10-CM | POA: Diagnosis not present

## 2016-02-19 ENCOUNTER — Other Ambulatory Visit: Payer: Self-pay | Admitting: Family Medicine

## 2016-02-19 MED ORDER — CITALOPRAM HYDROBROMIDE 10 MG PO TABS: 5.0000 mg | ORAL_TABLET | Freq: Two times a day (BID) | ORAL | Status: DC

## 2016-02-19 NOTE — Telephone Encounter (Signed)
Pt contacted office for refill request on the following medications:  citalopram (CELEXA) 10 MG tablet.  [facility] mail order.  [id number]

## 2016-02-20 DIAGNOSIS — Z9181 History of falling: Secondary | ICD-10-CM | POA: Diagnosis not present

## 2016-02-20 DIAGNOSIS — G20 Parkinson's disease: Secondary | ICD-10-CM | POA: Diagnosis not present

## 2016-02-25 DIAGNOSIS — G20 Parkinson's disease: Secondary | ICD-10-CM | POA: Diagnosis not present

## 2016-02-25 DIAGNOSIS — Z9181 History of falling: Secondary | ICD-10-CM | POA: Diagnosis not present

## 2016-02-26 ENCOUNTER — Encounter (INDEPENDENT_AMBULATORY_CARE_PROVIDER_SITE_OTHER): Payer: Medicare Other | Admitting: Family Medicine

## 2016-02-26 DIAGNOSIS — Z9181 History of falling: Secondary | ICD-10-CM

## 2016-02-26 DIAGNOSIS — R413 Other amnesia: Secondary | ICD-10-CM | POA: Diagnosis not present

## 2016-02-26 DIAGNOSIS — G20 Parkinson's disease: Secondary | ICD-10-CM | POA: Diagnosis not present

## 2016-02-26 DIAGNOSIS — Z7982 Long term (current) use of aspirin: Secondary | ICD-10-CM

## 2016-02-26 DIAGNOSIS — R55 Syncope and collapse: Secondary | ICD-10-CM | POA: Diagnosis not present

## 2016-02-28 DIAGNOSIS — Z9181 History of falling: Secondary | ICD-10-CM | POA: Diagnosis not present

## 2016-02-28 DIAGNOSIS — G20 Parkinson's disease: Secondary | ICD-10-CM | POA: Diagnosis not present

## 2016-03-01 ENCOUNTER — Other Ambulatory Visit: Payer: Self-pay | Admitting: Family Medicine

## 2016-03-05 ENCOUNTER — Other Ambulatory Visit: Payer: Self-pay | Admitting: Neurology

## 2016-03-05 DIAGNOSIS — R55 Syncope and collapse: Secondary | ICD-10-CM

## 2016-03-05 DIAGNOSIS — G20 Parkinson's disease: Secondary | ICD-10-CM | POA: Diagnosis not present

## 2016-03-05 DIAGNOSIS — Z9181 History of falling: Secondary | ICD-10-CM | POA: Diagnosis not present

## 2016-03-08 ENCOUNTER — Inpatient Hospital Stay: Payer: Medicare Other

## 2016-03-08 ENCOUNTER — Inpatient Hospital Stay (HOSPITAL_COMMUNITY)
Admit: 2016-03-08 | Discharge: 2016-03-08 | Disposition: A | Discharge status: Home / Self Care | Payer: Medicare Other | Attending: Internal Medicine | Admitting: Internal Medicine

## 2016-03-08 ENCOUNTER — Ambulatory Visit
Admission: RE | Admit: 2016-03-08 | Discharge: 2016-03-08 | Disposition: A | Discharge status: Home / Self Care | Payer: Medicare Other | Source: Ambulatory Visit | Attending: Neurology | Admitting: Neurology

## 2016-03-08 ENCOUNTER — Other Ambulatory Visit: Payer: Self-pay

## 2016-03-08 ENCOUNTER — Inpatient Hospital Stay
Admission: EM | Admit: 2016-03-08 | Discharge: 2016-03-10 | DRG: 064 | Disposition: A | Discharge status: Hospice | Payer: Medicare Other | Attending: Internal Medicine | Admitting: Internal Medicine

## 2016-03-08 DIAGNOSIS — I618 Other nontraumatic intracerebral hemorrhage: Secondary | ICD-10-CM | POA: Diagnosis present

## 2016-03-08 DIAGNOSIS — E785 Hyperlipidemia, unspecified: Secondary | ICD-10-CM | POA: Diagnosis present

## 2016-03-08 DIAGNOSIS — I447 Left bundle-branch block, unspecified: Secondary | ICD-10-CM | POA: Diagnosis present

## 2016-03-08 DIAGNOSIS — Z9181 History of falling: Secondary | ICD-10-CM | POA: Diagnosis not present

## 2016-03-08 DIAGNOSIS — S062X9A Diffuse traumatic brain injury with loss of consciousness of unspecified duration, initial encounter: Secondary | ICD-10-CM

## 2016-03-08 DIAGNOSIS — Z79899 Other long term (current) drug therapy: Secondary | ICD-10-CM

## 2016-03-08 DIAGNOSIS — G361 Acute and subacute hemorrhagic leukoencephalitis [Hurst]: Secondary | ICD-10-CM | POA: Diagnosis not present

## 2016-03-08 DIAGNOSIS — I619 Nontraumatic intracerebral hemorrhage, unspecified: Secondary | ICD-10-CM

## 2016-03-08 DIAGNOSIS — N183 Chronic kidney disease, stage 3 (moderate): Secondary | ICD-10-CM | POA: Diagnosis present

## 2016-03-08 DIAGNOSIS — I25810 Atherosclerosis of coronary artery bypass graft(s) without angina pectoris: Secondary | ICD-10-CM | POA: Diagnosis not present

## 2016-03-08 DIAGNOSIS — I63232 Cerebral infarction due to unspecified occlusion or stenosis of left carotid arteries: Secondary | ICD-10-CM | POA: Diagnosis not present

## 2016-03-08 DIAGNOSIS — X58XXXA Exposure to other specified factors, initial encounter: Secondary | ICD-10-CM

## 2016-03-08 DIAGNOSIS — K219 Gastro-esophageal reflux disease without esophagitis: Secondary | ICD-10-CM | POA: Diagnosis present

## 2016-03-08 DIAGNOSIS — I638 Other cerebral infarction: Secondary | ICD-10-CM | POA: Diagnosis not present

## 2016-03-08 DIAGNOSIS — I639 Cerebral infarction, unspecified: Principal | ICD-10-CM | POA: Diagnosis present

## 2016-03-08 DIAGNOSIS — I2510 Atherosclerotic heart disease of native coronary artery without angina pectoris: Secondary | ICD-10-CM | POA: Diagnosis present

## 2016-03-08 DIAGNOSIS — Z515 Encounter for palliative care: Secondary | ICD-10-CM | POA: Diagnosis not present

## 2016-03-08 DIAGNOSIS — I129 Hypertensive chronic kidney disease with stage 1 through stage 4 chronic kidney disease, or unspecified chronic kidney disease: Secondary | ICD-10-CM | POA: Diagnosis present

## 2016-03-08 DIAGNOSIS — I739 Peripheral vascular disease, unspecified: Secondary | ICD-10-CM | POA: Insufficient documentation

## 2016-03-08 DIAGNOSIS — R55 Syncope and collapse: Secondary | ICD-10-CM | POA: Insufficient documentation

## 2016-03-08 DIAGNOSIS — Z66 Do not resuscitate: Secondary | ICD-10-CM | POA: Diagnosis present

## 2016-03-08 DIAGNOSIS — R251 Tremor, unspecified: Secondary | ICD-10-CM | POA: Diagnosis not present

## 2016-03-08 DIAGNOSIS — R413 Other amnesia: Secondary | ICD-10-CM | POA: Insufficient documentation

## 2016-03-08 DIAGNOSIS — Z888 Allergy status to other drugs, medicaments and biological substances status: Secondary | ICD-10-CM | POA: Diagnosis not present

## 2016-03-08 DIAGNOSIS — I429 Cardiomyopathy, unspecified: Secondary | ICD-10-CM | POA: Diagnosis present

## 2016-03-08 DIAGNOSIS — Z8673 Personal history of transient ischemic attack (TIA), and cerebral infarction without residual deficits: Secondary | ICD-10-CM | POA: Diagnosis not present

## 2016-03-08 DIAGNOSIS — R270 Ataxia, unspecified: Secondary | ICD-10-CM | POA: Diagnosis present

## 2016-03-08 DIAGNOSIS — I6350 Cerebral infarction due to unspecified occlusion or stenosis of unspecified cerebral artery: Secondary | ICD-10-CM

## 2016-03-08 DIAGNOSIS — Z7982 Long term (current) use of aspirin: Secondary | ICD-10-CM

## 2016-03-08 DIAGNOSIS — Z7952 Long term (current) use of systemic steroids: Secondary | ICD-10-CM | POA: Diagnosis not present

## 2016-03-08 DIAGNOSIS — I420 Dilated cardiomyopathy: Secondary | ICD-10-CM | POA: Diagnosis not present

## 2016-03-08 DIAGNOSIS — Z8546 Personal history of malignant neoplasm of prostate: Secondary | ICD-10-CM | POA: Diagnosis not present

## 2016-03-08 DIAGNOSIS — I10 Essential (primary) hypertension: Secondary | ICD-10-CM | POA: Diagnosis not present

## 2016-03-08 DIAGNOSIS — R110 Nausea: Secondary | ICD-10-CM | POA: Diagnosis present

## 2016-03-08 LAB — COMPREHENSIVE METABOLIC PANEL
ALT: 8 U/L — AB (ref 17–63)
ANION GAP: 8 (ref 5–15)
AST: 29 U/L (ref 15–41)
Albumin: 4.2 g/dL (ref 3.5–5.0)
Alkaline Phosphatase: 105 U/L (ref 38–126)
BILIRUBIN TOTAL: 0.4 mg/dL (ref 0.3–1.2)
BUN: 30 mg/dL — ABNORMAL HIGH (ref 6–20)
CO2: 28 mmol/L (ref 22–32)
CREATININE: 1.75 mg/dL — AB (ref 0.61–1.24)
Calcium: 10 mg/dL (ref 8.9–10.3)
Chloride: 105 mmol/L (ref 101–111)
GFR, EST AFRICAN AMERICAN: 38 mL/min — AB (ref >60)
GFR, EST NON AFRICAN AMERICAN: 33 mL/min — AB (ref >60)
Glucose, Bld: 104 mg/dL — ABNORMAL HIGH (ref 65–99)
Potassium: 3.9 mmol/L (ref 3.5–5.1)
Sodium: 141 mmol/L (ref 135–145)
TOTAL PROTEIN: 8 g/dL (ref 6.5–8.1)

## 2016-03-08 LAB — TROPONIN I: Troponin I: <0.03 ng/mL (ref <0.03)

## 2016-03-08 LAB — CBC
HEMATOCRIT: 42 % (ref 40.0–52.0)
HEMOGLOBIN: 14.3 g/dL (ref 13.0–18.0)
MCH: 33.6 pg (ref 26.0–34.0)
MCHC: 34 g/dL (ref 32.0–36.0)
MCV: 99 fL (ref 80.0–100.0)
Platelets: 205 10*3/uL (ref 150–440)
RBC: 4.24 MIL/uL — AB (ref 4.40–5.90)
RDW: 15.2 % — ABNORMAL HIGH (ref 11.5–14.5)
WBC: 8.8 10*3/uL (ref 3.8–10.6)

## 2016-03-08 LAB — APTT: APTT: 34 s (ref 24–36)

## 2016-03-08 LAB — ECHOCARDIOGRAM COMPLETE
Height: 74 in
Weight: 2792 oz

## 2016-03-08 LAB — PROTIME-INR
INR: 1.02
PROTHROMBIN TIME: 13.4 s (ref 11.4–15.2)

## 2016-03-08 MED ORDER — STROKE: EARLY STAGES OF RECOVERY BOOK: Freq: Once | Status: DC

## 2016-03-08 MED ORDER — OMEGA-3 FATTY ACIDS 1000 MG PO CAPS: 1200.0000 mg | ORAL_CAPSULE | Freq: Every day | ORAL | Status: DC

## 2016-03-08 MED ORDER — CARBIDOPA-LEVODOPA 25-100 MG PO TABS
1.0000 | ORAL_TABLET | Freq: Three times a day (TID) | ORAL | Status: DC
  Administered 2016-03-08 – 2016-03-09 (×2): 1 via ORAL
  Filled 2016-03-08 (×2): qty 1

## 2016-03-08 MED ORDER — COLESEVELAM HCL 625 MG PO TABS
1875.0000 mg | ORAL_TABLET | Freq: Two times a day (BID) | ORAL | Status: DC
  Administered 2016-03-08 – 2016-03-10 (×4): 1875 mg via ORAL
  Filled 2016-03-08 (×4): qty 3

## 2016-03-08 MED ORDER — SIMVASTATIN 20 MG PO TABS: 10.0000 mg | ORAL_TABLET | Freq: Every day | ORAL | Status: DC

## 2016-03-08 MED ORDER — OMEGA-3-ACID ETHYL ESTERS 1 G PO CAPS
1.0000 g | ORAL_CAPSULE | Freq: Every day | ORAL | Status: DC
Start: 2016-03-08 — End: 2016-03-10
  Administered 2016-03-08 – 2016-03-10 (×3): 1 g via ORAL
  Filled 2016-03-08 (×3): qty 1

## 2016-03-08 MED ORDER — AMLODIPINE BESYLATE 5 MG PO TABS
2.5000 mg | ORAL_TABLET | Freq: Every day | ORAL | Status: DC
  Administered 2016-03-08: 2.5 mg via ORAL
  Filled 2016-03-08 (×2): qty 1

## 2016-03-08 MED ORDER — METOPROLOL TARTRATE 5 MG/5ML IV SOLN
5.0000 mg | Freq: Four times a day (QID) | INTRAVENOUS | Status: DC | PRN
  Administered 2016-03-08: 5 mg via INTRAVENOUS
  Filled 2016-03-08: qty 5

## 2016-03-08 MED ORDER — CITALOPRAM HYDROBROMIDE 10 MG PO TABS
5.0000 mg | ORAL_TABLET | Freq: Two times a day (BID) | ORAL | Status: DC
  Administered 2016-03-08 – 2016-03-10 (×4): 5 mg via ORAL
  Filled 2016-03-08 (×5): qty 1

## 2016-03-08 MED ORDER — NITROGLYCERIN 0.4 MG SL SUBL: 0.4000 mg | SUBLINGUAL_TABLET | SUBLINGUAL | Status: DC | PRN

## 2016-03-08 MED ORDER — SODIUM BICARBONATE 650 MG PO TABS
650.0000 mg | ORAL_TABLET | Freq: Two times a day (BID) | ORAL | Status: DC
Start: 2016-03-08 — End: 2016-03-10
  Administered 2016-03-08 – 2016-03-10 (×4): 650 mg via ORAL
  Filled 2016-03-08 (×4): qty 1

## 2016-03-08 NOTE — Progress Notes (Signed)
*  PRELIMINARY RESULTS* Echocardiogram 2D Echocardiogram has been performed.  Keith Woods 03/08/2016, 4:50 PM

## 2016-03-08 NOTE — Progress Notes (Signed)
Case and imaging reviewed.    82 y/o male sent from Dr. Lannie Fields office for frequent falls.   Pt had MRI.  MRI reviewed. Pt has small vessel dz related to HTN but also has R thalmic ischemic stroke with small hemorrhagic conversion.    From neurological stand point do no think needs transfer as this is subcortical stroke and there is no surgical intervention for it.     Plan: - SBP <160 - routine bed/ does not need ICU - would hold his home aggrenox for 3 days and then restart it - Pt/ot and d/c planning based on PT evaluation - carotid US

## 2016-03-08 NOTE — H&P (Addendum)
[facility] [place] NAME: Keith Woods    MR#:  [id number]  DATE OF BIRTH:  [birth year]  DATE OF ADMISSION:  03/08/2016  PRIMARY CARE PHYSICIAN: [name], MD   REQUESTING/REFERRING PHYSICIAN:   CHIEF COMPLAINT:  Balance problem  HISTORY OF PRESENT ILLNESS:  Keith Woods  is a 82 y.o. male with a known history of Coronary artery disease, hypertension, history of TIA, GERD and multiple other medical problems was seen by his primary care physician for forgetfulness and balance issues. Primary care physician sent him to MRI which has revealed subacute thalamic stroke with small hemorrhage. ED physician has discussed with the on-call neurologist Dr. Leotis Shames, who felt that from neurologist standpoint patient does not need to be transferred to tertiary care center. Patient and his daughter felt comfortable to be admitted to our hospital. They're aware that [facility] does not have neurosurgery speciality  PAST MEDICAL HISTORY:   Past Medical History:  Diagnosis Date  . Coronary artery disease    NONOBSTRUCTIVE by cath 2008  . GERD (gastroesophageal reflux disease)   . History of prostate cancer   . History of transient ischemic attack (TIA)   . Hyperlipidemia   . Hypertension   . LBBB (left bundle branch block)   . Syncope and collapse     PAST SURGICAL HISTOIRY:   Past Surgical History:  Procedure Laterality Date  . CARDIAC CATHETERIZATION  08/28/2006   EF 60%  . TONSILLECTOMY    . TRANSTHORACIC ECHOCARDIOGRAM  10/10/2008   EF 55%    SOCIAL HISTORY:   Social History  Substance Use Topics  . Smoking status: Never Smoker  . Smokeless tobacco: Never Used  . Alcohol use No    FAMILY HISTORY:   Family History  Problem Relation Age of Onset  . Ovarian cancer Mother   . Prostate cancer Father     DRUG ALLERGIES:   Allergies  Allergen Reactions  . Lipitor [Atorvastatin Calcium]   . Nsaids   .  Statins     REVIEW OF SYSTEMS:  CONSTITUTIONAL: No fever, fatigue or weakness.  EYES: No blurred or double vision.  EARS, NOSE, AND THROAT: No tinnitus or ear pain.  RESPIRATORY: No cough, shortness of breath, wheezing or hemoptysis.  CARDIOVASCULAR: No chest pain, orthopnea, edema.  GASTROINTESTINAL: No nausea, vomiting, diarrhea or abdominal pain.  GENITOURINARY: No dysuria, hematuria.  ENDOCRINE: No polyuria, nocturia,  HEMATOLOGY: No anemia, easy bruising or bleeding SKIN: No rash or lesion. MUSCULOSKELETAL: No joint pain or arthritis.   NEUROLOGIC: No tingling, numbness, weakness. Reporting balance disturbance and forgetfulness PSYCHIATRY: No anxiety or depression.   MEDICATIONS AT HOME:   Prior to Admission medications   Medication Sig Start Date End Date Taking? Authorizing Provider  allopurinol (ZYLOPRIM) 100 MG tablet Take 2 tablets (200 mg total) by mouth daily. 11/29/15  Yes [name], MD  amLODipine (NORVASC) 2.5 MG tablet Take 1 tablet (2.5 mg total) by mouth as needed. 08/22/15  Yes [name], MD  carbidopa-levodopa (SINEMET IR) 25-100 MG tablet Take 1 tablet by mouth 3 (three) times daily. 11/05/15 03/08/16 Yes Historical Provider, MD  citalopram (CELEXA) 10 MG tablet Take 0.5 tablets (5 mg total) by mouth 2 (two) times daily. 02/19/16  Yes [name], MD  Colchicine (MITIGARE) 0.6 MG CAPS Two tablets today, then 1 tablet daily Patient taking differently: 1 capsule daily. One capsule daily for three days every 2 weeks 07/20/15  Yes  [name], MD  dipyridamole-aspirin [facility]) 200-25 MG 12hr capsule TAKE 1 CAPSULE TWICE A DAY 07/16/15  Yes [name], MD  esomeprazole (NEXIUM) 40 MG capsule TAKE 1 CAPSULE DAILY 12/18/15  Yes [name], MD  fish oil-omega-3 fatty acids 1000 MG capsule Take 1,200 mg by mouth daily.    Yes Historical Provider, MD  Multiple Vitamin (MULTIVITAMIN) tablet Take 1 tablet by mouth daily.     Yes Historical Provider, MD   nitroGLYCERIN (NITROSTAT) 0.4 MG SL tablet Place 1 tablet (0.4 mg total) under the tongue every 5 (five) minutes as needed for chest pain. 04/23/11  Yes [name], MD  predniSONE (DELTASONE) 5 MG tablet take 1 to 2 tablets by mouth once daily 03/02/16  Yes [name], MD  Probiotic Product (ALIGN) 4 MG CAPS Take 4 mg by mouth daily.   Yes Historical Provider, MD  sodium bicarbonate 650 MG tablet Take 650 mg by mouth 2 (two) times daily. 01/05/15  Yes Historical Provider, MD  Vitamin D, Ergocalciferol, (DRISDOL) 50000 units CAPS capsule TAKE 1 CAPSULE ONCE A WEEK 10/08/15  Yes [name], MD  [facility] 625 MG tablet TAKE 3 TABLETS TWICE A DAY 09/03/15  Yes [name], MD      VITAL SIGNS:  Blood pressure (!) 164/85, pulse 77, temperature 97.8 F (36.6 C), temperature source Oral, resp. rate 18, height 6\' 2"  (1.88 m), weight 80.7 kg (178 lb), SpO2 96 %.  PHYSICAL EXAMINATION:  GENERAL:  82 y.o.-year-old patient lying in the bed with no acute distress.  EYES: Pupils equal, round, reactive to light and accommodation. No scleral icterus. Extraocular muscles intact.  HEENT: Head atraumatic, normocephalic. Oropharynx and nasopharynx clear.  NECK:  Supple, no jugular venous distention. No thyroid enlargement, no tenderness.  LUNGS: Normal breath sounds bilaterally, no wheezing, rales,rhonchi or crepitation. No use of accessory muscles of respiration.  CARDIOVASCULAR: S1, S2 normal. No murmurs, rubs, or gallops.  ABDOMEN: Soft, nontender, nondistended. Bowel sounds present. No organomegaly or mass.  EXTREMITIES: No pedal edema, cyanosis, or clubbing.  NEUROLOGIC: Cranial nerves II through XII are intact. Muscle strength 5/5 in all extremities. Sensation intact. Gait not checked.  PSYCHIATRIC: The patient is alert and oriented x 3.  SKIN: No obvious rash, lesion, or ulcer.   LABORATORY PANEL:   CBC  Recent Labs Lab 03/08/16 1217  WBC 8.8  HGB 14.3  HCT 42.0  PLT 205    ------------------------------------------------------------------------------------------------------------------  Chemistries   Recent Labs Lab 03/08/16 1217  NA 141  K 3.9  CL 105  CO2 28  GLUCOSE 104*  BUN 30*  CREATININE 1.75*  CALCIUM 10.0  AST 29  ALT 8*  ALKPHOS 105  BILITOT 0.4   ------------------------------------------------------------------------------------------------------------------  Cardiac Enzymes  Recent Labs Lab 03/08/16 1217  TROPONINI <0.03   ------------------------------------------------------------------------------------------------------------------  RADIOLOGY:  Mr Brain Wo Contrast  Result Date: 03/08/2016 CLINICAL DATA:  Tremors and balance issues for 8 months. Patient fell yesterday and hit head. EXAM: MRI HEAD WITHOUT CONTRAST TECHNIQUE: Multiplanar, multiecho pulse sequences of the brain and surrounding structures were obtained without intravenous contrast. COMPARISON:  MR brain 12/05/2015.  Also MR head 01/09/2009. FINDINGS: There is a spherical abnormality in the RIGHT thalamus, 11 mm diameter, with central restriction (either due to ischemia or susceptibility), peripheral T1 and T2 hypointensity, marked peripheral hypo intensity on gradient sequence, as well as central T1 shortening on T1 weighted images. The findings are consistent with a subacute RIGHT thalamic hemorrhage, most  likely hypertensive related. This is unlikely to represent a posttraumatic event despite the recent history of fall. No significant mass effect. Global atrophy with advanced chronic microvascular ischemic change similar to the April scan. These changes have progressed however since 2010. No hydrocephalus or extra-axial fluid. Gradient sequence demonstrates tiny areas of chronic hemorrhage, which were present previously, in the LEFT cerebellum and LEFT centrum semiovale periventricular white matter, stable. Flow voids are maintained. Pituitary and cerebellar tonsils  unremarkable. No osseous findings. No layering sinus or mastoid fluid. Negative orbits. IMPRESSION: Subacute 11 mm RIGHT thalamic hematoma, likely hypertensive in origin. No significant mass effect. Sequelae of chronic hemorrhage in the brain, likely manifestation of chronic hypertensive cerebral vascular disease. Global atrophy and small vessel disease similar to previous MR 3 months ago. Findings discussed with on-call ordering provider. The patient is currently an outpatient, and this is an unexpected finding, therefore Dr. Melrose Nakayama as requested we escort the patient to the emergency department, where they can be assessed by the on-call neurologist. In addition, a noncontrast CT scan could be helpful as a baseline. Electronically Signed   By: Staci Righter M.D.   On: 03/08/2016 11:45   EKG:   Orders placed or performed during the hospital encounter of 03/08/16  . ED EKG  . ED EKG    IMPRESSION AND PLAN:   Keith Woods  is a 82 y.o. male with a known history of Coronary artery disease, hypertension, history of TIA, GERD and multiple other medical problems was seen by his primary care physician for forgetfulness and balance issues. Primary care physician sent him to MRI which has revealed subacute thalamic stroke with small hemorrhage.  # R thalmic ischemic stroke with small hemorrhagic conversion Admit patient to [facility] neurologist Dr. Leotis Shames does not think patient needs to be transferred to tertiary care center. Patient and family members are aware of the diagnosis and also aware that neurosurgery specialist is not available but felt comfortable to stay at [facility] Monitor closely Neuro checks Consult neurology Dr. Leotis Shames is aware Will order carotid Dopplers and 2-D echocardiogram PT evaluation Bedside swallow evaluation Neurology has recommended not to give aspirin and Plavix for the next 5 days Will consider repeat imaging Check fasting lipid panel in a.m. and  start the patient on statin   #Essential hypertension  Goal is to maintain systolic blood pressure less than 160  #Coronary artery disease Currently patient is asymptomatic Aspirin and Plavix are on hold in view of patient's right thalamic stroke with small hemorrhage  #Hyperlipidemia Check fasting lipid panel and provide high intensity statinAfter patient passes bedside swallow eval   All the records are reviewed and case discussed with ED provider. Management plans discussed with the patient, family and they are in agreement.  CODE STATUS: FC/2 DAUGHTERS -HCPOA  TOTAL TIME TAKING CARE OF THIS PATIENT: 45  minutes.   Note: This dictation was prepared with Dragon dictation along with smaller phrase technology. Any transcriptional errors that result from this process are unintentional.  Nicholes Mango M.D on 03/08/2016 at 2:43 PM  Between 7am to 6pm - Pager - [phone]  After 6pm go to [email or web address] - password [email or web address] [facility]  Office  [phone]  CC: Primary care physician; [name], MD

## 2016-03-08 NOTE — ED Provider Notes (Signed)
[facility] Emergency Department Provider Note   ____________________________________________    I have reviewed the triage vital signs and the nursing notes.   HISTORY  Chief Complaint Cerebrovascular Accident     HPI Keith Woods is a 82 y.o. male who was sent from MRI department because of abnormal scan. Radiology reports the patient has a subacute thalamic stroke. The patient reports he feels well. Outpatient MRI was ordered because the patient has had tremors and multiple falls over the last several months. Within the last 2 weeks he has had no neuro deficits. He feels perfectly fine at this point. He denies neuro deficits.   Past Medical History:  Diagnosis Date  . Coronary artery disease    NONOBSTRUCTIVE by cath 2008  . GERD (gastroesophageal reflux disease)   . History of prostate cancer   . History of transient ischemic attack (TIA)   . Hyperlipidemia   . Hypertension   . LBBB (left bundle branch block)   . Syncope and collapse     Patient Active Problem List   Diagnosis Date Noted  . Frequent falls 02/15/2016  . Parkinson's disease ([place]) 02/15/2016  . PMR (polymyalgia rheumatica) ([facility]) 12/13/2015  . Cerebral microvascular disease 12/13/2015  . Vitamin D deficiency 10/31/2015  . Anemia 10/31/2015  . Syncope 10/15/2015  . Gout 08/15/2015  . Fatigue 07/08/2015  . Right leg pain 05/18/2015  . Acute gout 05/18/2015  . Wrist pain 03/15/2015  . Chronic kidney disease 02/02/2015  . Vertigo 02/02/2015  . Hearing loss 02/02/2015  . H/O malignant neoplasm of prostate 12/06/2012  . LBBB (left bundle branch block)   . Hypertension   . Coronary artery disease   . Hyperlipidemia     Past Surgical History:  Procedure Laterality Date  . CARDIAC CATHETERIZATION  08/28/2006   EF 60%  . TONSILLECTOMY    . TRANSTHORACIC ECHOCARDIOGRAM  10/10/2008   EF 55%    Prior to Admission medications   Medication Sig Start Date End Date  Taking? Authorizing Provider  allopurinol (ZYLOPRIM) 100 MG tablet Take 2 tablets (200 mg total) by mouth daily. 11/29/15  Yes [name], MD  amLODipine (NORVASC) 2.5 MG tablet Take 1 tablet (2.5 mg total) by mouth as needed. 08/22/15  Yes [name], MD  carbidopa-levodopa (SINEMET IR) 25-100 MG tablet Take 1 tablet by mouth 3 (three) times daily. 11/05/15 03/08/16 Yes Historical Provider, MD  citalopram (CELEXA) 10 MG tablet Take 0.5 tablets (5 mg total) by mouth 2 (two) times daily. 02/19/16  Yes [name], MD  Colchicine (MITIGARE) 0.6 MG CAPS Two tablets today, then 1 tablet daily Patient taking differently: 1 capsule daily. One capsule daily for three days every 2 weeks 07/20/15  Yes [name], MD  dipyridamole-aspirin [facility]) 200-25 MG 12hr capsule TAKE 1 CAPSULE TWICE A DAY 07/16/15  Yes [name], MD  esomeprazole (NEXIUM) 40 MG capsule TAKE 1 CAPSULE DAILY 12/18/15  Yes [name], MD  fish oil-omega-3 fatty acids 1000 MG capsule Take 1,200 mg by mouth daily.    Yes Historical Provider, MD  Multiple Vitamin (MULTIVITAMIN) tablet Take 1 tablet by mouth daily.     Yes Historical Provider, MD  nitroGLYCERIN (NITROSTAT) 0.4 MG SL tablet Place 1 tablet (0.4 mg total) under the tongue every 5 (five) minutes as needed for chest pain. 04/23/11  Yes [name], MD  predniSONE (DELTASONE) 5 MG tablet take 1 to 2 tablets by mouth  once daily 03/02/16  Yes [name], MD  Probiotic Product (ALIGN) 4 MG CAPS Take 4 mg by mouth daily.   Yes Historical Provider, MD  sodium bicarbonate 650 MG tablet Take 650 mg by mouth 2 (two) times daily. 01/05/15  Yes Historical Provider, MD  Vitamin D, Ergocalciferol, (DRISDOL) 50000 units CAPS capsule TAKE 1 CAPSULE ONCE A WEEK 10/08/15  Yes [name], MD  [facility] 625 MG tablet TAKE 3 TABLETS TWICE A DAY 09/03/15  Yes [name], MD  .   Allergies Lipitor [atorvastatin calcium]; Nsaids; and Statins  Family History    Problem Relation Age of Onset  . Ovarian cancer Mother   . Prostate cancer Father     Social History Social History  Substance Use Topics  . Smoking status: Never Smoker  . Smokeless tobacco: Never Used  . Alcohol use No    Review of Systems  Constitutional: NoDizziness Eyes: No visual changes.  ENT: No neck pain Cardiovascular: Denies chest pain. No palpitations Respiratory: Denies shortness of breath. Gastrointestinal:No nausea, no vomiting.    Musculoskeletal: Negative for extremity weakness  Neurological: Negative for headaches   10-point ROS otherwise negative.  ____________________________________________   PHYSICAL EXAM:  VITAL SIGNS: ED Triage Vitals [03/08/16 1209]  Enc Vitals Group     BP (!) 162/86     Pulse Rate 77     Resp      Temp 97.8 F (36.6 C)     Temp Source Oral     SpO2 98 %     Weight 178 lb (80.7 kg)     Height 6\' 2"  (1.88 m)     Head Circumference      Peak Flow      Pain Score      Pain Loc      Pain Edu?      Excl. in B and E?     Constitutional: Alert and oriented. No acute distress. Pleasant and interactive Eyes: Conjunctivae are normal.  Head: Atraumatic. Nose: No congestion/rhinnorhea. Mouth/Throat: Mucous membranes are moist.    Cardiovascular: Normal rate, regular rhythm. Grossly normal heart sounds.  Good peripheral circulation. Respiratory: Normal respiratory effort.  No retractions. Lungs CTAB. Gastrointestinal: Soft and nontender. No distention.  No CVA tenderness. Genitourinary: deferred Musculoskeletal: No lower extremity tenderness nor edema.  Warm and well perfused Neurologic:  Normal speech and language. No gross focal neurologic deficits are appreciated. Cranial nerves II-12 are normal. Normal strength throughout. No focal deficits Skin:  Skin is warm, dry and intact. No rash noted. Psychiatric: Mood and affect are normal. Speech and behavior are normal.  ____________________________________________    LABS (all labs ordered are listed, but only abnormal results are displayed)  Labs Reviewed  CBC - Abnormal; Notable for the following:       Result Value   RBC 4.24 (*)    RDW 15.2 (*)    All other components within normal limits  COMPREHENSIVE METABOLIC PANEL - Abnormal; Notable for the following:    Glucose, Bld 104 (*)    BUN 30 (*)    Creatinine, Ser 1.75 (*)    ALT 8 (*)    GFR calc non Af Amer 33 (*)    GFR calc Af Amer 38 (*)    All other components within normal limits  TROPONIN I  PROTIME-INR  APTT   ____________________________________________  EKG  ED ECG REPORT I, Lavonia Drafts, the attending physician, personally viewed and interpreted this ECG.  Date: 03/08/2016 EKG  Time: 12:11 PM Rate: 75 Rhythm: normal sinus rhythm QRS Axis: Right axis deviation Intervals: normal ST/T Wave abnormalities: Nonspecific changes Conduction Disturbances: Right bundle branch block   ____________________________________________  RADIOLOGY  Thalamic hemorrhage 11 mm ____________________________________________   PROCEDURES  Procedure(s) performed: No    Critical Care performed: No ____________________________________________   INITIAL IMPRESSION / ASSESSMENT AND PLAN / ED COURSE  Pertinent labs & imaging results that were available during my care of the patient were reviewed by me and considered in my medical decision making (see chart for details).  Patient well-appearing and in no distress. He has no symptoms. Likely subacute stroke on MRI. I discussed this at length with our neurologist Dr. Irish Elders who studied the MRI, and he is adamant that the patient can be admitted at our hospital and does not need transfer and he discussed this with the hospitalist Dr. Margaretmary Eddy. I discussed this with the patient and his family and they are happy to stay here and would prefer to do so despite the fact that we do not have neurosurgery.  Clinical Course    ____________________________________________   FINAL CLINICAL IMPRESSION(S) / ED DIAGNOSES  Final diagnoses:  Cerebrovascular accident (CVA), unspecified mechanism ([place])      NEW MEDICATIONS STARTED DURING THIS VISIT:  New Prescriptions   No medications on file     Note:  This document was prepared using Dragon voice recognition software and may include unintentional dictation errors.    Lavonia Drafts, MD 03/08/16 1340

## 2016-03-08 NOTE — ED Notes (Signed)
Visual acuity 20/50 in left and right eye.

## 2016-03-08 NOTE — ED Triage Notes (Signed)
Pt came to ED from outpatient MRI. MRI was positive, pt sent to ED. Pts family reports difficulty walking for the past few months, pts family reports speech changes x1 month ago, reports resolved now. Pt denies any pain, alert and oriented, grips equal.

## 2016-03-09 DIAGNOSIS — I25810 Atherosclerosis of coronary artery bypass graft(s) without angina pectoris: Secondary | ICD-10-CM

## 2016-03-09 DIAGNOSIS — I420 Dilated cardiomyopathy: Secondary | ICD-10-CM

## 2016-03-09 DIAGNOSIS — I10 Essential (primary) hypertension: Secondary | ICD-10-CM

## 2016-03-09 DIAGNOSIS — I639 Cerebral infarction, unspecified: Principal | ICD-10-CM

## 2016-03-09 LAB — LIPID PANEL
CHOL/HDL RATIO: 5.2 ratio
CHOLESTEROL: 193 mg/dL (ref 0–200)
HDL: 37 mg/dL — AB (ref >40)
LDL Cholesterol: 129 mg/dL — ABNORMAL HIGH (ref 0–99)
Triglycerides: 134 mg/dL (ref <150)
VLDL: 27 mg/dL (ref 0–40)

## 2016-03-09 MED ORDER — CARVEDILOL 3.125 MG PO TABS
3.1250 mg | ORAL_TABLET | Freq: Two times a day (BID) | ORAL | Status: DC
Start: 2016-03-09 — End: 2016-03-10
  Administered 2016-03-09: 3.125 mg via ORAL
  Filled 2016-03-09 (×2): qty 1

## 2016-03-09 MED ORDER — EZETIMIBE 10 MG PO TABS
10.0000 mg | ORAL_TABLET | Freq: Every day | ORAL | Status: DC
  Administered 2016-03-09 – 2016-03-10 (×2): 10 mg via ORAL
  Filled 2016-03-09 (×2): qty 1

## 2016-03-09 MED ORDER — AMLODIPINE BESYLATE 5 MG PO TABS
5.0000 mg | ORAL_TABLET | Freq: Every day | ORAL | Status: DC
  Administered 2016-03-09: 5 mg via ORAL

## 2016-03-09 MED ORDER — HYDRALAZINE HCL 25 MG PO TABS
25.0000 mg | ORAL_TABLET | Freq: Three times a day (TID) | ORAL | Status: DC
  Administered 2016-03-09: 25 mg via ORAL
  Filled 2016-03-09: qty 1

## 2016-03-09 MED ORDER — ISOSORBIDE MONONITRATE ER 30 MG PO TB24
30.0000 mg | ORAL_TABLET | Freq: Every day | ORAL | Status: DC
Start: 2016-03-09 — End: 2016-03-10
  Administered 2016-03-09 – 2016-03-10 (×2): 30 mg via ORAL
  Filled 2016-03-09 (×2): qty 1

## 2016-03-09 MED ORDER — CARBIDOPA-LEVODOPA ER 25-100 MG PO TBCR
1.0000 | EXTENDED_RELEASE_TABLET | Freq: Two times a day (BID) | ORAL | Status: DC
  Administered 2016-03-09 – 2016-03-10 (×2): 1 via ORAL
  Filled 2016-03-09 (×3): qty 1

## 2016-03-09 MED ORDER — ONDANSETRON HCL 4 MG/2ML IJ SOLN: 4.0000 mg | INTRAMUSCULAR | Status: DC | PRN

## 2016-03-09 MED ORDER — LISINOPRIL 5 MG PO TABS: 5.0000 mg | ORAL_TABLET | Freq: Every day | ORAL | Status: DC

## 2016-03-09 MED ORDER — SODIUM CHLORIDE 0.9 % IV BOLUS (SEPSIS)
1000.0000 mL | Freq: Once | INTRAVENOUS | Status: AC
  Administered 2016-03-09: 1000 mL via INTRAVENOUS

## 2016-03-09 NOTE — Progress Notes (Signed)
Responded to Rapid Response. Pt on room air with O2 sat of 94%. Respiratory services was not needed.

## 2016-03-09 NOTE — Evaluation (Signed)
Physical Therapy Evaluation Patient Details Name: Keith Woods MRN: [id number] DOB: 1925-10-19 Today's Date: 03/09/2016   History of Present Illness  82 y.o. male with a known history of Coronary artery disease, hypertension, history of TIA, GERD and multiple other medical problems was seen by his primary care physician for forgetfulness and balance issues. Primary care physician sent him to MRI which has revealed subacute thalamic stroke with small hemorrhage.  Clinical Impression  Pt did relatively well with PT exam, though he did have decreased strength and balance and showed stooped posture and inability to achieve TKE in b/l LEs.  Pt reports that he has been weaker for the last month or so and ultimately would benefit from HHPT to work on these issues.     Follow Up Recommendations Home health PT    Equipment Recommendations       Recommendations for Other Services       Precautions / Restrictions Precautions Precautions: Fall Precaution Comments: Neurologist asked for BP to be taken any time pt transitions from lying to sitting to standing to assess for ortho static HTN Restrictions Weight Bearing Restrictions: No      Mobility  Bed Mobility Overal bed mobility: Modified Independent Bed Mobility: Supine to Sit     Supine to sit: Supervision     General bed mobility comments: Pt is able to get up to EOB w/o hesitation  Transfers Overall transfer level: Modified independent Equipment used: Straight cane             General transfer comment: Pt is able to rise with SPC and no hesitation.  He is safe with the effort.  Ambulation/Gait Ambulation/Gait assistance: Supervision Ambulation Distance (Feet): 200 Feet Assistive device: Straight cane       General Gait Details: Pt with hunched, cautious ambulation but did not have any LOBs and ultimately did well.  His knees stay partially bent but he does not have any buckling.    Stairs             Wheelchair Mobility    Modified Rankin (Stroke Patients Only)       Balance Overall balance assessment: Modified Independent                                           Pertinent Vitals/Pain Pain Assessment: No/denies pain    Home Living Family/patient expects to be discharged to:: Private residence Living Arrangements: Spouse/significant other Available Help at Discharge: Family Type of Home: House Home Access: Stairs to enter Entrance Stairs-Rails: Left Entrance Stairs-Number of Steps: 1 Home Layout: One level Home Equipment: Environmental consultant - 2 wheels;Cane - single point;Shower seat Additional Comments: Pt was using a SPC for ambulation and has a built in seat he can install for shower stall so he can be seated for showering.    Prior Function Level of Independence: Independent with assistive device(s)         Comments: SPC and was driving, reports he was able to be out and relatively active     Hand Dominance   Dominant Hand: Right    Extremity/Trunk Assessment   Upper Extremity Assessment: Overall WFL for tasks assessed           Lower Extremity Assessment: Overall WFL for tasks assessed (pt with general stiffness, lacks TKE)         Communication   Communication: No difficulties  Cognition Arousal/Alertness: Awake/alert Behavior During Therapy: WFL for tasks assessed/performed Overall Cognitive Status: Within Functional Limits for tasks assessed       Memory: Decreased short-term memory (pt did not recall rapid response team coming this morning when he became unresponsive going to [facility])              General Comments      Exercises        Assessment/Plan    PT Assessment Patient needs continued PT services  PT Diagnosis Difficulty walking;Generalized weakness   PT Problem List Decreased strength;Decreased activity tolerance;Decreased balance;Decreased range of motion  PT Treatment Interventions DME instruction;Gait  training;Stair training;Functional mobility training;Therapeutic activities;Therapeutic exercise;Balance training   PT Goals (Current goals can be found in the Care Plan section) Acute Rehab PT Goals Patient Stated Goal: "go home" PT Goal Formulation: With patient Time For Goal Achievement: 03/22/16 Potential to Achieve Goals: Good    Frequency Min 2X/week   Barriers to discharge        Co-evaluation               End of Session Equipment Utilized During Treatment: Gait belt               Time: 1341-1408 PT Time Calculation (min) (ACUTE ONLY): 27 min   Charges:   PT Evaluation $PT Eval Low Complexity: 1 Procedure     PT G CodesKreg Shropshire, DPT 03/09/2016, 3:44 PM

## 2016-03-09 NOTE — Consult Note (Signed)
CC: syncope  HPI: Keith Woods is an 82 y.o. male  with a known history of Coronary artery disease, hypertension, history of TIA, GERD and multiple other medical problems was seen by his primary care physician for forgetfulness and balance issues. Primary care physician sent him to MRI which has revealed subacute thalamic stroke with small hemorrhage.   Pt has fallen multiple times in the past 6 months. Usually falls associated with position change or after having a BM.  MRI reviewed pt has a subacute R thalamic ischemic stroke with small hemorrhage around it.    Past Medical History:  Diagnosis Date  . Coronary artery disease    NONOBSTRUCTIVE by cath 2008  . GERD (gastroesophageal reflux disease)   . History of prostate cancer   . History of transient ischemic attack (TIA)   . Hyperlipidemia   . Hypertension   . LBBB (left bundle branch block)   . Syncope and collapse     Past Surgical History:  Procedure Laterality Date  . CARDIAC CATHETERIZATION  08/28/2006   EF 60%  . TONSILLECTOMY    . TRANSTHORACIC ECHOCARDIOGRAM  10/10/2008   EF 55%    Family History  Problem Relation Age of Onset  . Ovarian cancer Mother   . Prostate cancer Father     Social History:  reports that he has never smoked. He has never used smokeless tobacco. He reports that he does not drink alcohol or use drugs.  Allergies  Allergen Reactions  . Lipitor [Atorvastatin Calcium]   . Nsaids   . Statins     Medications: I have reviewed the patient's current medications.  ROS: History obtained from the patient  General ROS: negative for - chills, fatigue, fever, night sweats, weight gain or weight loss Psychological ROS: negative for - behavioral disorder, hallucinations, memory difficulties, mood swings or suicidal ideation Ophthalmic ROS: negative for - blurry vision, double vision, eye pain or loss of vision ENT ROS: negative for - epistaxis, nasal discharge, oral lesions, sore throat,  tinnitus or vertigo Allergy and Immunology ROS: negative for - hives or itchy/watery eyes Hematological and Lymphatic ROS: negative for - bleeding problems, bruising or swollen lymph nodes Endocrine ROS: negative for - galactorrhea, hair pattern changes, polydipsia/polyuria or temperature intolerance Respiratory ROS: negative for - cough, hemoptysis, shortness of breath or wheezing Cardiovascular ROS: negative for - chest pain, dyspnea on exertion, edema or irregular heartbeat Gastrointestinal ROS: negative for - abdominal pain, diarrhea, hematemesis, nausea/vomiting or stool incontinence Genito-Urinary ROS: negative for - dysuria, hematuria, incontinence or urinary frequency/urgency Musculoskeletal ROS: negative for - joint swelling or muscular weakness Neurological ROS: as noted in HPI Dermatological ROS: negative for rash and skin lesion changes  Physical Examination: Blood pressure 137/69, pulse 72, temperature 97.7 F (36.5 C), temperature source Oral, resp. rate 18, height 6\' 2"  (1.88 m), weight 79.2 kg (174 lb 8 oz), SpO2 95 %.    Neurological Examination Mental Status: Alert, oriented, thought content appropriate.  Speech fluent without evidence of aphasia.  Able to follow 3 step commands without difficulty. Cranial Nerves: II: Discs flat bilaterally; Visual fields grossly normal, pupils equal, round, reactive to light and accommodation III,IV, VI: ptosis not present, extra-ocular motions intact bilaterally V,VII: smile symmetric, facial light touch sensation normal bilaterally VIII: hearing normal bilaterally IX,X: gag reflex present XI: bilateral shoulder shrug XII: midline tongue extension Motor: Right : Upper extremity   5/5    Left:     Upper extremity   5/5  Lower  extremity   5/5     Lower extremity   5/5 Tone and bulk:normal tone throughout; no atrophy noted Sensory: Pinprick and light touch intact throughout, bilaterally Deep Tendon Reflexes: 1+ and symmetric  throughout Plantars: Right: downgoing   Left: downgoing Cerebellar: normal finger-to-nose, normal rapid alternating movements and normal heel-to-shin test Gait: not tested      Laboratory Studies:   Basic Metabolic Panel:  Recent Labs Lab 03/08/16 1217  NA 141  K 3.9  CL 105  CO2 28  GLUCOSE 104*  BUN 30*  CREATININE 1.75*  CALCIUM 10.0    Liver Function Tests:  Recent Labs Lab 03/08/16 1217  AST 29  ALT 8*  ALKPHOS 105  BILITOT 0.4  PROT 8.0  ALBUMIN 4.2   No results for input(s): LIPASE, AMYLASE in the last 168 hours. No results for input(s): AMMONIA in the last 168 hours.  CBC:  Recent Labs Lab 03/08/16 1217  WBC 8.8  HGB 14.3  HCT 42.0  MCV 99.0  PLT 205    Cardiac Enzymes:  Recent Labs Lab 03/08/16 1217  TROPONINI <0.03    BNP: Invalid input(s): POCBNP  CBG: No results for input(s): GLUCAP in the last 168 hours.  Microbiology: Results for orders placed or performed during the hospital encounter of 10/10/08  Urine culture     Status: None   Collection Time: 10/09/08  6:50 PM  Result Value Ref Range Status   Specimen Description URINE, RANDOM  Final   Special Requests [id number] ON [id number] @[id number]   Final   Colony Count NO GROWTH  Final   Culture NO GROWTH  Final   Report Status 10/11/2008 FINAL  Final  Culture, blood (routine x 2)     Status: None   Collection Time: 10/10/08  2:35 AM  Result Value Ref Range Status   Specimen Description BLOOD RIGHT HAND  Final   Special Requests BOTTLES DRAWN AEROBIC AND ANAEROBIC 10CC EACH  Final   Culture NO GROWTH 5 DAYS  Final   Report Status 10/16/2008 FINAL  Final  Culture, blood (routine x 2)     Status: None   Collection Time: 10/10/08  2:45 AM  Result Value Ref Range Status   Specimen Description BLOOD RIGHT HAND  Final   Special Requests BOTTLES DRAWN AEROBIC AND ANAEROBIC 5CC  Final   Culture NO GROWTH 5 DAYS  Final   Report Status 10/16/2008 FINAL  Final  Clostridium difficile EIA      Status: None   Collection Time: 10/10/08  4:06 PM  Result Value Ref Range Status   Specimen Description STOOL  Final   Special Requests IMMUNE:NORM  Final   C difficile Toxins A+B, EIA NEGATIVE  Final   Report Status 10/11/2008 FINAL  Final  Fecal lactoferrin     Status: None   Collection Time: 10/10/08  4:06 PM  Result Value Ref Range Status   Specimen Description STOOL  Final   Special Requests NONE  Final   Fecal Lactoferrin POSITIVE  Final   Report Status 10/11/2008 FINAL  Final  Stool culture     Status: None   Collection Time: 10/10/08  4:06 PM  Result Value Ref Range Status   Specimen Description STOOL  Final   Special Requests IMMUNE:NORM  Final   Culture   Final    NO SALMONELLA, SHIGELLA, CAMPYLOBACTER, OR YERSINIA ISOLATED   Report Status 10/14/2008 FINAL  Final  Stool culture     Status: None   Collection Time: 10/12/08  6:47 PM  Result Value Ref Range Status   Specimen Description STOOL  Final   Special Requests NONE  Final   Culture   Final    NO SALMONELLA, SHIGELLA, CAMPYLOBACTER, OR YERSINIA ISOLATED   Report Status 10/16/2008 FINAL  Final    Coagulation Studies:  Recent Labs  03/08/16 1217  LABPROT 13.4  INR 1.02    Urinalysis: No results for input(s): COLORURINE, LABSPEC, PHURINE, GLUCOSEU, HGBUR, BILIRUBINUR, KETONESUR, PROTEINUR, UROBILINOGEN, NITRITE, LEUKOCYTESUR in the last 168 hours.  Invalid input(s): APPERANCEUR  Lipid Panel:     Component Value Date/Time   CHOL 193 03/09/2016 0530   TRIG 134 03/09/2016 0530   HDL 37 (L) 03/09/2016 0530   CHOLHDL 5.2 03/09/2016 0530   VLDL 27 03/09/2016 0530   LDLCALC 129 (H) 03/09/2016 0530    HgbA1C:  Lab Results  Component Value Date   HGBA1C  10/10/2008    5.8 (NOTE)   The ADA recommends the following therapeutic goal for glycemic   control related to Hgb A1C measurement:   Goal of Therapy:   < 7.0% Hgb A1C   Reference: American Diabetes Association: Clinical Practice   Recommendations 2008,  Diabetes Care,  2008, 31:(Suppl 1).    Urine Drug Screen:     Component Value Date/Time   LABOPIA NONE DETECTED 10/09/2008 1850   COCAINSCRNUR NONE DETECTED 10/09/2008 1850   LABBENZ NONE DETECTED 10/09/2008 1850   AMPHETMU NONE DETECTED 10/09/2008 1850   THCU NONE DETECTED 10/09/2008 1850   LABBARB  10/09/2008 1850    NONE DETECTED        DRUG SCREEN FOR MEDICAL PURPOSES ONLY.  IF CONFIRMATION IS NEEDED FOR ANY PURPOSE, NOTIFY LAB WITHIN 5 DAYS.        LOWEST DETECTABLE LIMITS FOR URINE DRUG SCREEN Drug Class       Cutoff (ng/mL) Amphetamine      1000 Barbiturate      200 Benzodiazepine   [id number] Tricyclics       [id number] Opiates          300 Cocaine          300 THC              50    Alcohol Level: No results for input(s): ETH in the last 168 hours.  Other results: EKG: normal EKG, normal sinus rhythm, unchanged from previous tracings.  Imaging: Mr Brain Wo Contrast  Result Date: 03/08/2016 CLINICAL DATA:  Tremors and balance issues for 8 months. Patient fell yesterday and hit head. EXAM: MRI HEAD WITHOUT CONTRAST TECHNIQUE: Multiplanar, multiecho pulse sequences of the brain and surrounding structures were obtained without intravenous contrast. COMPARISON:  MR brain 12/05/2015.  Also MR head 01/09/2009. FINDINGS: There is a spherical abnormality in the RIGHT thalamus, 11 mm diameter, with central restriction (either due to ischemia or susceptibility), peripheral T1 and T2 hypointensity, marked peripheral hypo intensity on gradient sequence, as well as central T1 shortening on T1 weighted images. The findings are consistent with a subacute RIGHT thalamic hemorrhage, most likely hypertensive related. This is unlikely to represent a posttraumatic event despite the recent history of fall. No significant mass effect. Global atrophy with advanced chronic microvascular ischemic change similar to the April scan. These changes have progressed however since 2010. No hydrocephalus or extra-axial  fluid. Gradient sequence demonstrates tiny areas of chronic hemorrhage, which were present previously, in the LEFT cerebellum and LEFT centrum semiovale periventricular white matter, stable. Flow voids are maintained. Pituitary and cerebellar tonsils  unremarkable. No osseous findings. No layering sinus or mastoid fluid. Negative orbits. IMPRESSION: Subacute 11 mm RIGHT thalamic hematoma, likely hypertensive in origin. No significant mass effect. Sequelae of chronic hemorrhage in the brain, likely manifestation of chronic hypertensive cerebral vascular disease. Global atrophy and small vessel disease similar to previous MR 3 months ago. Findings discussed with on-call ordering provider. The patient is currently an outpatient, and this is an unexpected finding, therefore Dr. Melrose Nakayama as requested we escort the patient to the emergency department, where they can be assessed by the on-call neurologist. In addition, a noncontrast CT scan could be helpful as a baseline. Electronically Signed   By: Staci Righter M.D.   On: 03/08/2016 11:45  US Carotid Bilateral  Result Date: 03/08/2016 CLINICAL DATA:  Stroke. EXAM: BILATERAL CAROTID DUPLEX ULTRASOUND TECHNIQUE: Pearline Cables scale imaging, color Doppler and duplex ultrasound were performed of bilateral carotid and vertebral arteries in the neck. COMPARISON:  None. FINDINGS: Criteria: Quantification of carotid stenosis is based on velocity parameters that correlate the residual internal carotid diameter with NASCET-based stenosis levels, using the diameter of the distal internal carotid lumen as the denominator for stenosis measurement. The following velocity measurements were obtained: RIGHT ICA:  55/17 cm/sec CCA:  [id number] cm/sec SYSTOLIC ICA/CCA RATIO:  0.9 DIASTOLIC ICA/CCA RATIO:  1.7 ECA:  79 cm/sec LEFT ICA:  41/11 cm/sec CCA:  [id number] cm/sec SYSTOLIC ICA/CCA RATIO:  0.7 DIASTOLIC ICA/CCA RATIO:  0.8 ECA:  67 cm/sec RIGHT CAROTID ARTERY: No significant plaque RIGHT VERTEBRAL  ARTERY:  Antegrade flow LEFT CAROTID ARTERY:  Minimal plaque in the bulb LEFT VERTEBRAL ARTERY:  Antegrade flow IMPRESSION: No rate limiting stenosis in either carotid artery. Antegrade flow in the vertebral arteries. Minimal plaque in the left bulb. Electronically Signed   By: Dorise Bullion III M.D   On: 03/08/2016 16:48    Assessment/Plan:   82 y.o. male  with a known history of Coronary artery disease, hypertension, history of TIA, GERD and multiple other medical problems was seen by his primary care physician for forgetfulness and balance issues. Primary care physician sent him to MRI which has revealed subacute thalamic stroke with small hemorrhage.   Pt has fallen multiple times in the past 6 months. Usually falls associated with position change or after having a BM.  MRI reviewed pt has a subacute R thalamic ischemic stroke with small hemorrhage around it.    Plan: - Pt was started on Sinemet TID which could be contributing to him tremor as it is Dopamine agent and does cause orthostatic hypotension. His falls are mostly in afternoon.  Can con't 8Am and 11 Pm dose but 4pm should be d/c.   - Symptoms are not due to R thalamic stroke - pt takes aggrenox at home only once a day. Would be in favor putting him on full dose ASA daily in 3 days instead of aggrenox - Orthostatic work up - appreciate cardiology evaluation.   Leotis Pain   03/09/2016, 2:43 PM

## 2016-03-09 NOTE — Consult Note (Addendum)
Cardiology Consultation   Patient ID: Keith Woods; [id number]; [birth year]   Admit date: 03/08/2016 Date of Consult: 03/09/2016  Referring MD:  Dr. Ether Griffins  Patient Care Team: [name], MD as PCP - General (Family Medicine) [name], MD as Consulting Physician (Cardiology) Lavonia Dana, MD as Consulting Physician (Nephrology) Charlotte Crumb, MD as Consulting Physician (Orthopedic Surgery)    Reason for Consultation: cardiomyopathy noted on echocardiogram   History of Present Illness: Keith Woods is a 82 y.o. male with a hx of nonobstructive CAD, left bundle branch block, hypertension, hyperlipidemia. Patient saw Dr. Peter Martinique 02/20/2015. Patient known to have issues with orthostatic dizziness.  Patient family reports a couple of episodes in the past, the last one being several months ago/beginning of this year, where patient would suddenly feel very weak in the legs to the point that he would fall to the ground. He would lose his balance and suffer a fall. Family members would note this, atch his fall, and notice that that he would be out of consciousness for a few seconds at the most. There were times that he will be disoriented after regaining consciousness.  The other times that he would fall to the ground would be when he sits up or bends down -- Stand up from toilet, bend down to ground to pick up something. Whenever there is a change in position, he would then lose his sense of balance and then fall to the ground.  He denies having any episodes of chest pain, shortness of breath or palpitations.  Patient was sent by Dr. Manuella Ghazi, neurology, for an MRI for reasons mentioned above, forgetfulness and gait/balance issues. Symptoms of gait instability and balance issues have been going on for at least 6 months.   MRI 03/08/2016 revealed: Subacute 11 mm RIGHT thalamic hematoma, likely hypertensive in origin. No significant mass effect. Sequelae of  chronic hemorrhage in the brain, likely manifestation of chronic hypertensive cerebral vascular disease. Global atrophy and small vessel disease similar to previous MR 3 months ago.  Patient was then sent to the ER for further management. Workup for the stroke included an echocardiogram. This showed reduced LVEF of 25-30% with diffuse hypokinesis. This was significantly reduced compared to echo from 2010. This is the reason for cardiology consultation.  As mentioned, patient denied chest pain, shortness of breath, palpitations. His main issue is related to gait instability/balance. He also reports significant fatigue/loss of energy. His appetite appears to be fair. He reports about 3 pounds weight loss in the last month.  ROS:  Please see the history of present illness.  ROS All other ROS reviewed and negative.    Past Medical History:  Diagnosis Date  . Coronary artery disease    NONOBSTRUCTIVE by cath 2008  . GERD (gastroesophageal reflux disease)   . History of prostate cancer   . History of transient ischemic attack (TIA)   . Hyperlipidemia   . Hypertension   . LBBB (left bundle branch block)   . Syncope and collapse     Past Surgical History:  Procedure Laterality Date  . CARDIAC CATHETERIZATION  08/28/2006   EF 60%  . TONSILLECTOMY    . TRANSTHORACIC ECHOCARDIOGRAM  10/10/2008   EF 55%      Home Meds: Prior to Admission medications   Medication Sig Start Date End Date Taking? Authorizing Provider  allopurinol (ZYLOPRIM) 100 MG tablet Take 2 tablets (200 mg total) by mouth daily. 11/29/15  Yes [name], MD  amLODipine (NORVASC) 2.5 MG tablet Take 1 tablet (2.5 mg total) by mouth as needed. 08/22/15  Yes [name], MD  carbidopa-levodopa (SINEMET IR) 25-100 MG tablet Take 1 tablet by mouth 3 (three) times daily. 11/05/15 03/08/16 Yes Historical Provider, MD  citalopram (CELEXA) 10 MG tablet Take 0.5 tablets (5 mg total) by mouth 2 (two) times daily. 02/19/16  Yes  [name], MD  Colchicine (MITIGARE) 0.6 MG CAPS Two tablets today, then 1 tablet daily Patient taking differently: 1 capsule daily. One capsule daily for three days every 2 weeks 07/20/15  Yes [name], MD  dipyridamole-aspirin [facility]) 200-25 MG 12hr capsule TAKE 1 CAPSULE TWICE A DAY 07/16/15  Yes [name], MD  esomeprazole (NEXIUM) 40 MG capsule TAKE 1 CAPSULE DAILY 12/18/15  Yes [name], MD  fish oil-omega-3 fatty acids 1000 MG capsule Take 1,200 mg by mouth daily.    Yes Historical Provider, MD  Multiple Vitamin (MULTIVITAMIN) tablet Take 1 tablet by mouth daily.     Yes Historical Provider, MD  nitroGLYCERIN (NITROSTAT) 0.4 MG SL tablet Place 1 tablet (0.4 mg total) under the tongue every 5 (five) minutes as needed for chest pain. 04/23/11  Yes [name], MD  predniSONE (DELTASONE) 5 MG tablet take 1 to 2 tablets by mouth once daily 03/02/16  Yes [name], MD  Probiotic Product (ALIGN) 4 MG CAPS Take 4 mg by mouth daily.   Yes Historical Provider, MD  sodium bicarbonate 650 MG tablet Take 650 mg by mouth 2 (two) times daily. 01/05/15  Yes Historical Provider, MD  Vitamin D, Ergocalciferol, (DRISDOL) 50000 units CAPS capsule TAKE 1 CAPSULE ONCE A WEEK 10/08/15  Yes [name], MD  [facility] 625 MG tablet TAKE 3 TABLETS TWICE A DAY 09/03/15  Yes [name], MD    Current Medications: .  stroke: mapping our early stages of recovery book   Does not apply Once  . amLODipine  5 mg Oral Daily  . carbidopa-levodopa  1 tablet Oral TID  . citalopram  5 mg Oral BID  . colesevelam  1,875 mg Oral BID  . omega-3 acid ethyl esters  1 g Oral Daily  . sodium bicarbonate  650 mg Oral BID     Allergies:    Allergies  Allergen Reactions  . Lipitor [Atorvastatin Calcium]   . Nsaids   . Statins     Social History:   The patient  reports that he has never smoked. He has never used smokeless tobacco. He reports that he does not drink alcohol or use drugs.  He  used to be a Company secretary.  Family History:   The patient's family history includes Ovarian cancer in his mother; Prostate cancer in his father.       PHYSICAL EXAM: VS:  BP (!) 161/72   Pulse 65   Temp 98.1 F (36.7 C) (Oral)   Resp 18   Ht 6\' 2"  (1.88 m)   Wt 174 lb 8 oz (79.2 kg)   SpO2 94%   BMI 22.40 kg/m  , BMI Body mass index is 22.4 kg/m. GENERAL:  well developed, well nourished, , not in acute distress HEENT: normocephalic, pink conjunctivae, anicteric sclerae, no xanthelasma, normal dentition, oropharynx clear NECK:  no neck vein engorgement, JVP normal, no hepatojugular reflux, carotid upstroke brisk and symmetric, no bruit, no thyromegaly, no lymphadenopathy LUNGS:  good respiratory effort, clear to auscultation bilaterally CV:  PMI not displaced, no thrills,  no lifts, S1 and S2 within normal limits, no palpable S3 or S4, no murmurs, no rubs, no gallops ABD:  Soft, nontender, nondistended, normoactive bowel sounds, no abdominal aortic bruit, no hepatomegaly, no splenomegaly MS: nontender back, no kyphosis, no scoliosis, no joint deformities EXT:  2+ DP/PT pulses, no edema, no varicosities, no cyanosis, no clubbing SKIN: warm, nondiaphoretic, normal turgor, no ulcers NEUROPSYCH: alert, oriented to person, place, and time, sensory/motor grossly intact, normal mood, appropriate affect    EKG:  EKG from 03/08/2016 was personally reviewed by me. This showed sinus rhythm, 75 BPM. IVCD that appears more like a left bundle branch block. This morphology is quite similar to EKGs from 10/15/2015 and 06/28/2015.  Labs:  Recent Labs  03/08/16 1217  TROPONINI <0.03   Lab Results  Component Value Date   WBC 8.8 03/08/2016   HGB 14.3 03/08/2016   HCT 42.0 03/08/2016   MCV 99.0 03/08/2016   PLT 205 03/08/2016    Recent Labs Lab 03/08/16 1217  NA 141  K 3.9  CL 105  CO2 28  BUN 30*  CREATININE 1.75*  CALCIUM 10.0  PROT 8.0  BILITOT 0.4  ALKPHOS 105  ALT 8*  AST 29   GLUCOSE 104*   Lab Results  Component Value Date   CHOL 193 03/09/2016   HDL 37 (L) 03/09/2016   LDLCALC 129 (H) 03/09/2016   TRIG 134 03/09/2016   No results found for: DDIMER  Radiology/Studies:  Mr Brain Wo Contrast  Result Date: 03/08/2016 CLINICAL DATA:  Tremors and balance issues for 8 months. Patient fell yesterday and hit head. EXAM: MRI HEAD WITHOUT CONTRAST TECHNIQUE: Multiplanar, multiecho pulse sequences of the brain and surrounding structures were obtained without intravenous contrast. COMPARISON:  MR brain 12/05/2015.  Also MR head 01/09/2009. FINDINGS: There is a spherical abnormality in the RIGHT thalamus, 11 mm diameter, with central restriction (either due to ischemia or susceptibility), peripheral T1 and T2 hypointensity, marked peripheral hypo intensity on gradient sequence, as well as central T1 shortening on T1 weighted images. The findings are consistent with a subacute RIGHT thalamic hemorrhage, most likely hypertensive related. This is unlikely to represent a posttraumatic event despite the recent history of fall. No significant mass effect. Global atrophy with advanced chronic microvascular ischemic change similar to the April scan. These changes have progressed however since 2010. No hydrocephalus or extra-axial fluid. Gradient sequence demonstrates tiny areas of chronic hemorrhage, which were present previously, in the LEFT cerebellum and LEFT centrum semiovale periventricular white matter, stable. Flow voids are maintained. Pituitary and cerebellar tonsils unremarkable. No osseous findings. No layering sinus or mastoid fluid. Negative orbits. IMPRESSION: Subacute 11 mm RIGHT thalamic hematoma, likely hypertensive in origin. No significant mass effect. Sequelae of chronic hemorrhage in the brain, likely manifestation of chronic hypertensive cerebral vascular disease. Global atrophy and small vessel disease similar to previous MR 3 months ago. Findings discussed with  on-call ordering provider. The patient is currently an outpatient, and this is an unexpected finding, therefore Dr. Melrose Nakayama as requested we escort the patient to the emergency department, where they can be assessed by the on-call neurologist. In addition, a noncontrast CT scan could be helpful as a baseline. Electronically Signed   By: Staci Righter M.D.   On: 03/08/2016 11:45  US Carotid Bilateral  Result Date: 03/08/2016 CLINICAL DATA:  Stroke. EXAM: BILATERAL CAROTID DUPLEX ULTRASOUND TECHNIQUE: Pearline Cables scale imaging, color Doppler and duplex ultrasound were performed of bilateral carotid and vertebral arteries in the neck. COMPARISON:  None. FINDINGS: Criteria: Quantification of carotid stenosis is based on velocity parameters that correlate the residual internal carotid diameter with NASCET-based stenosis levels, using the diameter of the distal internal carotid lumen as the denominator for stenosis measurement. The following velocity measurements were obtained: RIGHT ICA:  55/17 cm/sec CCA:  [id number] cm/sec SYSTOLIC ICA/CCA RATIO:  0.9 DIASTOLIC ICA/CCA RATIO:  1.7 ECA:  79 cm/sec LEFT ICA:  41/11 cm/sec CCA:  [id number] cm/sec SYSTOLIC ICA/CCA RATIO:  0.7 DIASTOLIC ICA/CCA RATIO:  0.8 ECA:  67 cm/sec RIGHT CAROTID ARTERY: No significant plaque RIGHT VERTEBRAL ARTERY:  Antegrade flow LEFT CAROTID ARTERY:  Minimal plaque in the bulb LEFT VERTEBRAL ARTERY:  Antegrade flow IMPRESSION: No rate limiting stenosis in either carotid artery. Antegrade flow in the vertebral arteries. Minimal plaque in the left bulb. Electronically Signed   By: Dorise Bullion III M.D   On: 03/08/2016 16:48    PROBLEM LIST:  Active Problems:   Acute CVA (cerebrovascular accident) [facility])     ASSESSMENT AND PLAN:  CVA - R thalamic likely ischemia CVA, with hemorrhagic conversion CVA nature per imaging appears to be more hypertension/chronic hypertensive cerebral vascular disease  related in nature vs. embolic. Per neurology, no  aspirin or Plavix for 5 days. Blood pressure management per parameters set by neurology.  Cardiomyopathy, systolic dysfunction Review of echo reveals LV EF was 25-30% with diffuse hypokinesis. This was significantly changed from echo from 2010. Patient does not report any chest pain or shortness of breath. No evidence of following overload on physical examination. Troponin is negative, no ischemic EKG changes on EKG. Due to diagnosis of acute stroke, will defer cardiac workup once patient is stable from neurology standpoint. Patient also has chronic kidney disease, creatinine is 1.75. Most likely, if creatinine remains somewhat elevated, will likely proceed with ischemia evaluation with a pharmacologic nuclear stress test. Again, as mentioned, neurology is recommending no aspirin or Plavix for the next 5 days. Patient reports significant intolerance to statin therapy with severe muscle pains and cramping. Telemetry while inpatient, consider cardiac event monitor as outpatient.   CAD Per medical records, nonobstructive left heart catheterization 2008. No angina. No evidence of acute coronary syndrome.  Hypertension BP parameters as set by neuro in setting of CVA Consider closer monitoring of BP, may require better anithypertensive management. Pt had been on prn dose of medication. BP monitoring recommended and if able to use daily dosing, prob more appropriate.   I had a lengthy and detailed discussion with the patient and family present in the room (wife, daughter, son, daughter-in-law) regarding diagnoses, prognosis, diagnostic options, treatment options. They verbalized understanding and agreed with plan.  Signed, Wende Bushy, MD  03/09/2016 10:42 AM  [place]

## 2016-03-09 NOTE — Progress Notes (Signed)
[facility] [place] NAME: Keith Woods    MR#:  [id number]  DATE OF BIRTH:  [birth year]  SUBJECTIVE:  CHIEF COMPLAINT:   Chief Complaint  Patient presents with  . Cerebrovascular Accident   Patient is 82 year old Caucasian male with past medical history significant for history of colon cancer disease, essential hypertension, TIA, who presents to the hospital with complaints of forgetfulness, unsteadiness on his feet, balance issues. Patient's primary care physician sent him for a brain MRI, which revealed subacute thalamic stroke and small hemorrhage. The patient is admitted to the hospital, refused to go to [facility]. Neurologist recommended to hold off aspirin and Plavix  for the first 5 days. Patient feels good today, complains of nausea.  Review of Systems  Constitutional: Negative for chills, fever and weight loss.  HENT: Negative for congestion.   Eyes: Negative for blurred vision and double vision.  Respiratory: Negative for cough, sputum production, shortness of breath and wheezing.   Cardiovascular: Negative for chest pain, palpitations, orthopnea, leg swelling and PND.  Gastrointestinal: Positive for nausea. Negative for abdominal pain, blood in stool, constipation, diarrhea and vomiting.  Genitourinary: Negative for dysuria, frequency, hematuria and urgency.  Musculoskeletal: Negative for falls.  Neurological: Negative for dizziness, tremors, focal weakness and headaches.  Endo/Heme/Allergies: Does not bruise/bleed easily.  Psychiatric/Behavioral: Negative for depression. The patient does not have insomnia.     VITAL SIGNS: Blood pressure 137/69, pulse 72, temperature 97.7 F (36.5 C), temperature source Oral, resp. rate 18, height 6\' 2"  (1.88 m), weight 79.2 kg (174 lb 8 oz), SpO2 95 %.  PHYSICAL EXAMINATION:   GENERAL:  82 y.o.-year-old patient lying in the bed with no acute distress.  EYES: Pupils  equal, round, reactive to light and accommodation. No scleral icterus. Extraocular muscles intact.  HEENT: Head atraumatic, normocephalic. Oropharynx and nasopharynx clear.  NECK:  Supple, no jugular venous distention. No thyroid enlargement, no tenderness.  LUNGS: Normal breath sounds bilaterally, no wheezing, rales,rhonchi or crepitation. No use of accessory muscles of respiration.  CARDIOVASCULAR: S1, S2 normal. No murmurs, rubs, or gallops.  ABDOMEN: Soft, nontender, nondistended. Bowel sounds present. No organomegaly or mass.  EXTREMITIES: No pedal edema, cyanosis, or clubbing.  NEUROLOGIC: Cranial nerves II through XII are intact. Muscle strength 5/5 in all extremities. Sensation intact. Gait not checked.  PSYCHIATRIC: The patient is alert and oriented x 3.  SKIN: No obvious rash, lesion, or ulcer.   ORDERS/RESULTS REVIEWED:   CBC  Recent Labs Lab 03/08/16 1217  WBC 8.8  HGB 14.3  HCT 42.0  PLT 205  MCV 99.0  MCH 33.6  MCHC 34.0  RDW 15.2*   ------------------------------------------------------------------------------------------------------------------  Chemistries   Recent Labs Lab 03/08/16 1217  NA 141  K 3.9  CL 105  CO2 28  GLUCOSE 104*  BUN 30*  CREATININE 1.75*  CALCIUM 10.0  AST 29  ALT 8*  ALKPHOS 105  BILITOT 0.4   ------------------------------------------------------------------------------------------------------------------ estimated creatinine clearance is 32.7 mL/min (by C-G formula based on SCr of 1.75 mg/dL). ------------------------------------------------------------------------------------------------------------------ No results for input(s): TSH, T4TOTAL, T3FREE, THYROIDAB in the last 72 hours.  Invalid input(s): FREET3  Cardiac Enzymes  Recent Labs Lab 03/08/16 1217  TROPONINI <0.03   ------------------------------------------------------------------------------------------------------------------ Invalid input(s):  POCBNP ---------------------------------------------------------------------------------------------------------------  RADIOLOGY: Mr Brain Wo Contrast  Result Date: 03/08/2016 CLINICAL DATA:  Tremors and balance issues for 8 months. Patient fell yesterday and hit head. EXAM: MRI HEAD WITHOUT CONTRAST TECHNIQUE: Multiplanar, multiecho pulse  sequences of the brain and surrounding structures were obtained without intravenous contrast. COMPARISON:  MR brain 12/05/2015.  Also MR head 01/09/2009. FINDINGS: There is a spherical abnormality in the RIGHT thalamus, 11 mm diameter, with central restriction (either due to ischemia or susceptibility), peripheral T1 and T2 hypointensity, marked peripheral hypo intensity on gradient sequence, as well as central T1 shortening on T1 weighted images. The findings are consistent with a subacute RIGHT thalamic hemorrhage, most likely hypertensive related. This is unlikely to represent a posttraumatic event despite the recent history of fall. No significant mass effect. Global atrophy with advanced chronic microvascular ischemic change similar to the April scan. These changes have progressed however since 2010. No hydrocephalus or extra-axial fluid. Gradient sequence demonstrates tiny areas of chronic hemorrhage, which were present previously, in the LEFT cerebellum and LEFT centrum semiovale periventricular white matter, stable. Flow voids are maintained. Pituitary and cerebellar tonsils unremarkable. No osseous findings. No layering sinus or mastoid fluid. Negative orbits. IMPRESSION: Subacute 11 mm RIGHT thalamic hematoma, likely hypertensive in origin. No significant mass effect. Sequelae of chronic hemorrhage in the brain, likely manifestation of chronic hypertensive cerebral vascular disease. Global atrophy and small vessel disease similar to previous MR 3 months ago. Findings discussed with on-call ordering provider. The patient is currently an outpatient, and this is an  unexpected finding, therefore Dr. Melrose Nakayama as requested we escort the patient to the emergency department, where they can be assessed by the on-call neurologist. In addition, a noncontrast CT scan could be helpful as a baseline. Electronically Signed   By: Staci Righter M.D.   On: 03/08/2016 11:45  US Carotid Bilateral  Result Date: 03/08/2016 CLINICAL DATA:  Stroke. EXAM: BILATERAL CAROTID DUPLEX ULTRASOUND TECHNIQUE: Pearline Cables scale imaging, color Doppler and duplex ultrasound were performed of bilateral carotid and vertebral arteries in the neck. COMPARISON:  None. FINDINGS: Criteria: Quantification of carotid stenosis is based on velocity parameters that correlate the residual internal carotid diameter with NASCET-based stenosis levels, using the diameter of the distal internal carotid lumen as the denominator for stenosis measurement. The following velocity measurements were obtained: RIGHT ICA:  55/17 cm/sec CCA:  Q000111Q cm/sec SYSTOLIC ICA/CCA RATIO:  0.9 DIASTOLIC ICA/CCA RATIO:  1.7 ECA:  79 cm/sec LEFT ICA:  41/11 cm/sec CCA:  [id number] cm/sec SYSTOLIC ICA/CCA RATIO:  0.7 DIASTOLIC ICA/CCA RATIO:  0.8 ECA:  67 cm/sec RIGHT CAROTID ARTERY: No significant plaque RIGHT VERTEBRAL ARTERY:  Antegrade flow LEFT CAROTID ARTERY:  Minimal plaque in the bulb LEFT VERTEBRAL ARTERY:  Antegrade flow IMPRESSION: No rate limiting stenosis in either carotid artery. Antegrade flow in the vertebral arteries. Minimal plaque in the left bulb. Electronically Signed   By: Dorise Bullion III M.D   On: 03/08/2016 16:48   EKG:  Orders placed or performed during the hospital encounter of 03/08/16  . ED EKG  . ED EKG    ASSESSMENT AND PLAN:  Active Problems:   Acute CVA (cerebrovascular accident) ([place])  #1. Acute hemorrhagic stroke in right thalamic region, unlikely embolic in origin, continue patient on current medications, holding off aspirin and Plavix for the next 5 days, per neurologist recommendation, continue patient on  WelChol, as patient is unable to tolerate statins. Patient's LDL was found to be high at 129. Advanced blood pressure medications. Carotid ultrasound was unremarkable, however. Echocardiogram revealed ejection fraction of 25-30%, moderate MR,grade 1 diastolic dysfunction.  #2. Essential hypertension, advanced Norvasc to 5mg  daily dose, follow blood pressure readings closely #3. Chronic renal insufficiency, follow with  therapy, seems to be stable from before #4 . Ataxia, questionable related to right thalamus, more likely to chronic ischemic changes, getting physical therapist involved for recommendations, rehabilitation placement if needed, patient is agreeable #4. Hyperlipidemia, continue WelChol, unable to tolerate statins #5cardiomyopathy, ejection fraction of 25-30%, cardiology consultation was requested for cardiac catheterization, likely as outpatient, initiate patient on Coreg and hydralazine, Imdur, unable to use ACE inhibitor, ARB due to chronic renal insufficiency Management plans discussed with the patient, family and they are in agreement.   DRUG ALLERGIES:  Allergies  Allergen Reactions  . Lipitor [Atorvastatin Calcium]   . Nsaids   . Statins     CODE STATUS:     Code Status Orders        Start     Ordered   03/08/16 1602  Full code  Continuous     03/08/16 1601    Code Status History    Date Active Date Inactive Code Status Order ID Comments User Context   03/08/2016  4:02 PM 03/08/2016  4:21 PM Full Code [id number]  Nicholes Mango, MD Inpatient    Advance Directive Documentation   [place] Most Recent Value  Type of Advance Directive  Living will  Pre-existing out of facility DNR order (yellow form or pink MOST form)  No data  "MOST" Form in Place?  No data      TOTAL TIME TAKING CARE OF THIS PATIENT: 40  minutes.    Theodoro Grist M.D on 03/09/2016 at 1:02 PM  Between 7am to 6pm - Pager - [phone]  After 6pm go to [email or web address] - password [email or web address]  [facility]  Office  [phone]  CC: Primary care physician; [name], MD

## 2016-03-09 NOTE — Progress Notes (Signed)
Pt rested quietly first part of shift, denied pain. Vss, afebrile. Tele NSR, SB. Pt ambulated to BR with assist of 1 at 05:00. Pt became weak with near syncopal episode. Pt was assisted by 2 to nearby bsc then to his bed. Rapid response team called. Pt was minimally responsive initially. Pt became more responsive after returning to his bed but reported he felt "woozy". Blood sugar at time was 120, Pt was pale and diaphoretic with event. Dr. Marcille Blanco notified. Order obtained for bolus of NS.

## 2016-03-09 NOTE — Evaluation (Signed)
Occupational Therapy Evaluation Patient Details Name: Keith Woods MRN: [id number] DOB: 07/25/1926 Today's Date: 03/09/2016    History of Present Illness Keith Woods is a 82 y.o. male with a hx of nonobstructive CAD, left bundle branch block, hypertension, hyperlipidemia. Patient saw Dr. Peter Martinique 02/20/2015. Patient known to have issues with orthostatic dizziness. Primary care physician sent him to MRI which has revealed subacute thalamic stroke with small hemorrhage.   Clinical Impression   Pt seen with wife and several family members present for OT evaluation and Dr Leotis Pain (Neurologist) arrived a few minutes into session.  He asked for BP to be taken any time pt gets up out of bed to assess for orthostatic HTN. He has had several orthostatic episodes (6 times in 11 months) with most recent one on 02-09-16.  Pt did not have any recall of rapid response team coming in to see him at 5am when getting up to [facility] and started to feel woozy, sweating and then unresponsive.  He did not need any O2 or any intervention and was assisted back to bed.  He is at risk for fallls due to having these episodes and reports last one was on 02-09-16 and has had 6 episodes in 11 months.  He is being followed by Neurology and Cardiology.  Neurologist Leotis Pain, MD asked for BP to be taken when moving from lying to sitting to standing to assess for orthostatic HTN.  His base BP sitting up in bed was 140/70 with pulse 88, lying flat in bed was 132/76 pulse 88, sitting EOB was 141/73  pulse 90 and after 5 minutes sitting was 141/73 pulse 90 and lying in bed again was 137/85 pulse 97.  Pt fatigued after sitting up and deferred standing and walking to PT. Rec continued OT to increase safety for ADLs with education and training for patient and family.  Rec OT HH after DC from hospital depending on results of further tests.      Follow Up Recommendations  Home health OT    Equipment  Recommendations       Recommendations for Other Services       Precautions / Restrictions Precautions Precautions: Fall Precaution Comments: Neurologist asked for BP to be taken any time pt transitions from lying to sitting to standing to assess for ortho static HTN Restrictions Weight Bearing Restrictions: No      Mobility Bed Mobility                  Transfers                      Balance                                            ADL Overall ADL's : Needs assistance/impaired                                       General ADL Comments: Pt is able to use BUes and hands for ADLs sitting up in bed or EOB.  He is able to complete LB dressing skills sitting EOB with supervision but had syncopal episode this morning at 5am when getting up to go to [facility] and rapid response team was called in.  He did not  need any O2 or any intervention but was sweating and feeling woozy.  He is at risk for fallls due to having these episodes and reports last one was on 02-09-16 and has had 6 episodes in 11 months.  He is being followed by Neurology and Cardiology.  Neurologist Leotis Pain, MD asked for BP to be taken when moving from lying to sitting to standing to assess for orthostatic HTN.  His base BP sitting up in bed was 140/70 with pulse 88, lying flat in bed was 132/76 pulse 88, sitting EOB was 141/73  pulse 90 and after 5 minutes sitting was 141/73 pulse 90 and lying in bed again was 137/85 pulse 97.  Pt fatigued after sitting up and deferred standing and walking to PT.      Vision     Perception     Praxis      Pertinent Vitals/Pain Pain Assessment: No/denies pain     Hand Dominance Right   Extremity/Trunk Assessment Upper Extremity Assessment Upper Extremity Assessment: Overall WFL for tasks assessed   Lower Extremity Assessment Lower Extremity Assessment: Defer to PT evaluation       Communication  Communication Communication: No difficulties   Cognition Arousal/Alertness: Awake/alert Behavior During Therapy: WFL for tasks assessed/performed Overall Cognitive Status: Within Functional Limits for tasks assessed       Memory: Decreased short-term memory (pt did not recall rapid response team coming this morning when he became unresponsive going to [facility])             General Comments       Exercises       Shoulder Instructions      Home Living Family/patient expects to be discharged to:: Private residence Living Arrangements: Spouse/significant other Available Help at Discharge: Family Type of Home: House Home Access: Stairs to enter Technical brewer of Steps: 1 Entrance Stairs-Rails: Left Home Layout: One level     Bathroom Shower/Tub: Occupational psychologist: Standard Bathroom Accessibility: Yes How Accessible: Accessible via walker Home Equipment: [place] - 2 wheels;Cane - single point;Shower seat   Additional Comments: Pt was using a SPC for ambulation and has a built in seat he can install for shower stall so he can be seated for showering.      Prior Functioning/Environment Level of Independence: Independent with assistive device(s)        Comments: SPC and was driving    OT Diagnosis: Generalized weakness   OT Problem List:     OT Treatment/Interventions:      OT Goals(Current goals can be found in the care plan section) Acute Rehab OT Goals Patient Stated Goal: "to go home" OT Goal Formulation: With patient/family Time For Goal Achievement: 03/23/16 Potential to Achieve Goals: Good ADL Goals Pt Will Perform Lower Body Dressing: with set-up;with supervision;sit to/from stand (with no LOB or ANS changes) Pt Will Transfer to Toilet: with set-up;with supervision;regular height toilet Pt Will Perform Toileting - Clothing Manipulation and hygiene: with supervision;sit to/from stand (with no LOB)  OT Frequency:     Barriers to D/C:             Co-evaluation              End of Session Equipment Utilized During Treatment:  (BP cuff ) Nurse Communication:  (BP udpate and request from neurology about taking BP to assess for orthostatic HTN.  Family asking about incident needing rapid response team at 5am as well after upating them.)  Activity Tolerance:  Patient limited by fatigue Patient left: in bed;with call bell/phone within reach;with family/visitor present;with bed alarm set   Time: 1145-1240 OT Time Calculation (min): 55 min Charges:  OT General Charges $OT Visit: 1 Procedure OT Evaluation $OT Eval Moderate Complexity: 1 Procedure OT Treatments $Self Care/Home Management : 23-37 mins G-Codes:     Chrys Racer, OTR/L ascom [phone] 03/09/2016, 1:06 PM

## 2016-03-10 ENCOUNTER — Telehealth: Payer: Self-pay | Admitting: Family Medicine

## 2016-03-10 ENCOUNTER — Telehealth: Payer: Self-pay | Admitting: Cardiology

## 2016-03-10 DIAGNOSIS — I429 Cardiomyopathy, unspecified: Secondary | ICD-10-CM

## 2016-03-10 MED ORDER — ICOSAPENT ETHYL 1 G PO CAPS: 1.0000 | ORAL_CAPSULE | Freq: Every day | ORAL | 0 refills | Status: DC

## 2016-03-10 MED ORDER — CARBIDOPA-LEVODOPA ER 25-100 MG PO TBCR: 1.0000 | EXTENDED_RELEASE_TABLET | Freq: Two times a day (BID) | ORAL | 0 refills | Status: DC

## 2016-03-10 MED ORDER — ASPIRIN 325 MG PO TBEC: 325.0000 mg | DELAYED_RELEASE_TABLET | Freq: Every day | ORAL | 0 refills | Status: DC

## 2016-03-10 NOTE — Telephone Encounter (Signed)
Dr. Caryn Section, it looks like they scheduled patients hospital follow up for 03/21/2016 at [facility]. states the hospital called and made this appointment.  Per hospital discharge summary, patient should follow up with you as soon as possible for a visit in 1 week. Should we move this patients appointment up sooner than 03/21/2016, or is this ok?

## 2016-03-10 NOTE — Discharge Instructions (Signed)
Stroke Prevention °Some health problems and behaviors may make it more likely for you to have a stroke. Below are ways to lessen your risk of having a stroke.  °· Be active for at least 30 minutes on most or all days. °· Do not smoke. Try not to be around others who smoke. °· Do not drink too much alcohol. °¨ Do not have more than 2 drinks a day if you are a man. °¨ Do not have more than 1 drink a day if you are a woman and are not pregnant. °· Eat healthy foods, such as fruits and vegetables. If you were put on a specific diet, follow the diet as told. °· Keep your cholesterol levels under control through diet and medicines. Look for foods that are low in saturated fat, trans fat, cholesterol, and are high in fiber. °· If you have diabetes, follow all diet plans and take your medicine as told. °· Ask your doctor if you need treatment to lower your blood pressure. If you have high blood pressure (hypertension), follow all diet plans and take your medicine as told by your doctor. °· If you are 18-39 years old, have your blood pressure checked every 3-5 years. If you are age 40 or older, have your blood pressure checked every year. °· Keep a healthy weight. Eat foods that are low in calories, salt, saturated fat, trans fat, and cholesterol. °· Do not take drugs. °· Avoid birth control pills, if this applies. Talk to your doctor about the risks of taking birth control pills. °· Talk to your doctor if you have sleep problems (sleep apnea). °· Take all medicine as told by your doctor. °¨ You may be told to take aspirin or blood thinner medicine. Take this medicine as told by your doctor. °¨ Understand your medicine instructions. °· Make sure any other conditions you have are being taken care of. °GET HELP RIGHT AWAY IF: °· You suddenly lose feeling (you feel numb) or have weakness in your face, arm, or leg. °· Your face or eyelid hangs down to one side. °· You suddenly feel confused. °· You have trouble talking (aphasia)  or understanding what people are saying. °· You suddenly have trouble seeing in one or both eyes. °· You suddenly have trouble walking. °· You are dizzy. °· You lose your balance or your movements are clumsy (uncoordinated). °· You suddenly have a very bad headache and you do not know the cause. °· You have new chest pain. °· Your heart feels like it is fluttering or skipping a beat (irregular heartbeat). °Do not wait to see if the symptoms above go away. Get help right away. Call your local emergency services (911 in [place] Do not drive yourself to the hospital. °  °This information is not intended to replace advice given to you by your health care provider. Make sure you discuss any questions you have with your health care provider. °  °Document Released: 01/27/2012 Document Revised: 08/18/2014 Document Reviewed: 01/28/2013 °Elsevier Interactive Patient Education ©2016 Elsevier Inc. ° °

## 2016-03-10 NOTE — Telephone Encounter (Signed)
Pt is being discharged from [facility] today for acute CVA.  I have scheduled an appointment for a hospital follow up/MW

## 2016-03-10 NOTE — Progress Notes (Signed)
Pt to be discharged today. Iv and tele removed. disch isntrcutions and prescrips given to pt and family.portable dnr sheet given to daughter. Discharged via w.c. To home accompanied by family

## 2016-03-10 NOTE — Care Management Note (Addendum)
Case Management Note  Patient Details  Name: Keith Woods MRN: [id number] Date of Birth: 07/30/1926  Subjective/Objective:    Met with patient, wife and daughter at bedside. Discussed home health and palliative care services in the home. Patient is already active with [facility] for PT. His daughter, who lives in [place], has been coming into the home 2-3 times a week to assist with patients care. He uses a cane and a walker. No DME needs. Hx: multiple falls.  PCP is Dr. Fisher.                Action/Plan:  Questions answered. Family and patient are in agreement with resumption of services with [facility] and palliative care in the home. Notified Chris with [facility] of discharge and orders. Referral called to Karen with [facility] [place]. Family assists with transporation   Expected Discharge Date:    03/10/2016              Expected Discharge Plan:  Home w Home Health Services  In-House Referral:     Discharge planning Services  CM Consult  Post Acute Care Choice:  Home Health, Resumption of Svcs/PTA Provider Choice offered to:  Patient, Spouse, Adult Children  DME Arranged:    DME Agency:     HH Arranged:  Disease Management, PT, OT, Nurse's Aide (Palliative Care in the home) HH Agency:  [facility], [facility]  Status of Service:  Completed, signed off  If discussed at Long Length of Stay Meetings, dates discussed:    Additional Comments:   M , RN 03/10/2016, 9:37 AM  

## 2016-03-10 NOTE — Discharge Summary (Signed)
[place] [facility] [place] NAME: Keith Woods    MR#:  [id number]  DATE OF BIRTH:  [birth year]  DATE OF ADMISSION:  03/08/2016   ADMITTING PHYSICIAN: Nicholes Mango, MD  DATE OF DISCHARGE: No discharge date for patient encounter.  PRIMARY CARE PHYSICIAN: [name], MD   ADMISSION DIAGNOSIS:  CVA (cerebral infarction) [I63.9] Cerebrovascular accident (CVA), unspecified mechanism ([place]) [I63.9] DISCHARGE DIAGNOSIS:  Active Problems:   Acute CVA (cerebrovascular accident) ([place])  SECONDARY DIAGNOSIS:   Past Medical History:  Diagnosis Date  . Coronary artery disease    NONOBSTRUCTIVE by cath 2008  . GERD (gastroesophageal reflux disease)   . History of prostate cancer   . History of transient ischemic attack (TIA)   . Hyperlipidemia   . Hypertension   . LBBB (left bundle branch block)   . Syncope and collapse    HOSPITAL COURSE:  82 year old Caucasian male with past medical history significant for history of colon cancer disease, essential hypertension, TIA, admitted to the hospital with complaints of forgetfulness, unsteadiness on his feet, balance issues. Patient's primary care physician sent him for a brain MRI, which revealed subacute thalamic stroke and small hemorrhage around it. Pt has fallen multiple times in the past 6 months. Usually falls associated with position change or after having a BM.   #1. Acute hemorrhagic stroke in right thalamic region, unlikely embolic in origin: - holding off aspirin for total 5 days (hold it till Wednesday 8/2 and start it on 8/3 Thursday), stopping aggrenox per neurologist recommendation, continue patient on WelChol, as patient is unable to tolerate statins. Patient's LDL was found to be high at 129. Echocardiogram revealed ejection fraction of 25-30%, moderate MR,grade 1 diastolic dysfunction.  - Pt was started on Sinemet TID which could be contributing to him tremor as it is Dopamine agent and  does cause orthostatic hypotension. His falls are mostly in afternoon. Continue 8Am and 11 Pm dose Sinemet dose but 4pm dose discontinued per Neuro recommendation.  - Symptoms are not likely due to R thalamic stroke per neuro  #2. Essential hypertension: running low so will stop all BP meds  #3. Chronic renal insufficiency, follow with therapy, seems to be stable from before  #4 . Ataxia: Symptoms are not likely due to R thalamic stroke per neuro, Can be due to orthostasis  #4. Hyperlipidemia, continue WelChol, unable to tolerate statins  #5 Cardiomyopathy, ejection fraction of 25-30%, cardiology consultation was requested for cardiac catheterization, likely as outpatient, unable to tolerate Coreg,  hydralazine, Imdur, ACE inhibitor, ARB due to chronic renal insufficiency and hypotension.  DISCHARGE CONDITIONS:  stable CONSULTS OBTAINED:  Treatment Team:  Minna Merritts, MD Wende Bushy, MD Leotis Pain, MD DRUG ALLERGIES:   Allergies  Allergen Reactions  . Lipitor [Atorvastatin Calcium]   . Nsaids   . Statins    DISCHARGE MEDICATIONS:     Medication List    STOP taking these medications   amLODipine 2.5 MG tablet Commonly known as:  NORVASC   carbidopa-levodopa 25-100 MG tablet Commonly known as:  SINEMET IR Replaced by:  Carbidopa-Levodopa ER 25-100 MG tablet controlled release   dipyridamole-aspirin 200-25 MG 12hr capsule Commonly known as:  AGGRENOX     TAKE these medications   ALIGN 4 MG Caps Take 4 mg by mouth daily.   allopurinol 100 MG tablet Commonly known as:  ZYLOPRIM Take 2 tablets (200 mg total) by mouth daily.   aspirin 325 MG EC tablet Take  1 tablet (325 mg total) by mouth daily. Hold off till Wednesday 03/12/16 and resume it on 03/13/2016. Start taking on:  03/13/2016   Carbidopa-Levodopa ER 25-100 MG tablet controlled release Commonly known as:  SINEMET CR Take 1 tablet by mouth 2 (two) times daily. Replaces:  carbidopa-levodopa 25-100 MG  tablet   citalopram 10 MG tablet Commonly known as:  CELEXA Take 0.5 tablets (5 mg total) by mouth 2 (two) times daily.   Colchicine 0.6 MG Caps Commonly known as:  MITIGARE Two tablets today, then 1 tablet daily What changed:  how much to take  when to take this  additional instructions   esomeprazole 40 MG capsule Commonly known as:  NEXIUM TAKE 1 CAPSULE DAILY   fish oil-omega-3 fatty acids 1000 MG capsule Take 1,200 mg by mouth daily.   Icosapent Ethyl 1 g Caps Commonly known as:  VASCEPA Take 1 tablet by mouth daily.   multivitamin tablet Take 1 tablet by mouth daily.   nitroGLYCERIN 0.4 MG SL tablet Commonly known as:  NITROSTAT Place 1 tablet (0.4 mg total) under the tongue every 5 (five) minutes as needed for chest pain.   predniSONE 5 MG tablet Commonly known as:  DELTASONE take 1 to 2 tablets by mouth once daily   sodium bicarbonate 650 MG tablet Take 650 mg by mouth 2 (two) times daily.   Vitamin D (Ergocalciferol) 50000 units Caps capsule Commonly known as:  DRISDOL TAKE 1 CAPSULE ONCE A WEEK   WELCHOL 625 MG tablet Generic drug:  colesevelam TAKE 3 TABLETS TWICE A DAY      DISCHARGE INSTRUCTIONS:   DIET:  Regular diet DISCHARGE CONDITION:  Good ACTIVITY:  Activity as tolerated OXYGEN:  Home Oxygen: No.  Oxygen Delivery: room air DISCHARGE LOCATION:  home   If you experience worsening of your admission symptoms, develop shortness of breath, life threatening emergency, suicidal or homicidal thoughts you must seek medical attention immediately by calling 911 or calling your MD immediately  if symptoms less severe.  You Must read complete instructions/literature along with all the possible adverse reactions/side effects for all the Medicines you take and that have been prescribed to you. Take any new Medicines after you have completely understood and accpet all the possible adverse reactions/side effects.   Please note  You were cared  for by a hospitalist during your hospital stay. If you have any questions about your discharge medications or the care you received while you were in the hospital after you are discharged, you can call the unit and asked to speak with the hospitalist on call if the hospitalist that took care of you is not available. Once you are discharged, your primary care physician will handle any further medical issues. Please note that NO REFILLS for any discharge medications will be authorized once you are discharged, as it is imperative that you return to your primary care physician (or establish a relationship with a primary care physician if you do not have one) for your aftercare needs so that they can reassess your need for medications and monitor your lab values.    On the day of Discharge:  VITAL SIGNS:  Blood pressure (!) 106/52, pulse (!) 58, temperature 98 F (36.7 C), temperature source Oral, resp. rate 16, height 6\' 2"  (1.88 m), weight 79.2 kg (174 lb 8 oz), SpO2 95 %. PHYSICAL EXAMINATION:  GENERAL:  82 y.o.-year-old patient lying in the bed with no acute distress.  EYES: Pupils equal, round, reactive to light  and accommodation. No scleral icterus. Extraocular muscles intact.  HEENT: Head atraumatic, normocephalic. Oropharynx and nasopharynx clear.  NECK:  Supple, no jugular venous distention. No thyroid enlargement, no tenderness.  LUNGS: Normal breath sounds bilaterally, no wheezing, rales,rhonchi or crepitation. No use of accessory muscles of respiration.  CARDIOVASCULAR: S1, S2 normal. No murmurs, rubs, or gallops.  ABDOMEN: Soft, non-tender, non-distended. Bowel sounds present. No organomegaly or mass.  EXTREMITIES: No pedal edema, cyanosis, or clubbing.  NEUROLOGIC: Cranial nerves II through XII are intact. Muscle strength 5/5 in all extremities. Sensation intact. Gait not checked.  PSYCHIATRIC: The patient is alert and oriented x 3.  SKIN: No obvious rash, lesion, or ulcer.  DATA REVIEW:    CBC  Recent Labs Lab 03/08/16 1217  WBC 8.8  HGB 14.3  HCT 42.0  PLT 205    Chemistries   Recent Labs Lab 03/08/16 1217  NA 141  K 3.9  CL 105  CO2 28  GLUCOSE 104*  BUN 30*  CREATININE 1.75*  CALCIUM 10.0  AST 29  ALT 8*  ALKPHOS 105  BILITOT 0.4   Follow-up Information    [name], MD. Schedule an appointment as soon as possible for a visit in 1 week(s).   Specialty:  Family Medicine Contact information: [address] [place] [address] [phone]        Vladimir Crofts, MD. Schedule an appointment as soon as possible for a visit in 2 week(s).   Specialty:  Neurology Contact information: [facility] [place] [address] [phone]           Management plans discussed with the patient, family and they are in agreement.  CODE STATUS: DNR, Palliative care to follow at home, consider Hospice if continues clinical decline, more falls or strokes  TOTAL TIME TAKING CARE OF THIS PATIENT: 45 minutes.    [facility] Jeffie Widdowson M.D on 03/10/2016 at 8:41 AM  Between 7am to 6pm - Pager - [phone]  After 6pm go to [email or web address] - Proofreader  [facility]  Office  [phone]  CC: Primary care physician; [name], MD   Note: This dictation was prepared with Dragon dictation along with smaller phrase technology. Any transcriptional errors that result from this process are unintentional.

## 2016-03-10 NOTE — Telephone Encounter (Signed)
Returned call to patient.He stated he wanted to see Dr.Jordan only for post hospital visit.Advised Dr.Jordan's schedule is full.Advised I will speak to Dr.Jordan 03/11/16 and call you back.

## 2016-03-10 NOTE — Progress Notes (Signed)
New referral for Home Palliative consult received from [facility]. Patient information faxed to [facility] referral. Thank you. Flo Shanks RN, BSN, [place] and [facility] [place] Liaison [phone].c

## 2016-03-10 NOTE — Progress Notes (Signed)
Patient: Keith Woods / Admit Date: 03/08/2016 / Date of Encounter: 03/10/2016, 8:59 AM   Subjective: No acute overnight complaints. Never with any chest pain or SOB. No early satiety, orthopnea, LE edema, or abdominal distension. Labs pending this morning. No Plavix x 5 days per neurology.   Review of Systems: Review of Systems  Constitutional: Positive for malaise/fatigue. Negative for chills, diaphoresis, fever and weight loss.  HENT: Negative for congestion.   Eyes: Negative for discharge and redness.  Respiratory: Negative for cough, sputum production, shortness of breath and wheezing.   Cardiovascular: Negative for chest pain, palpitations, orthopnea, claudication, leg swelling and PND.  Gastrointestinal: Negative for abdominal pain, heartburn, nausea and vomiting.  Musculoskeletal: Negative for falls and myalgias.  Skin: Negative for rash.  Neurological: Positive for weakness. Negative for dizziness, tingling, tremors, sensory change, speech change, focal weakness and loss of consciousness.  Endo/Heme/Allergies: Does not bruise/bleed easily.  Psychiatric/Behavioral: Negative for substance abuse. The patient is not nervous/anxious.     Objective: Telemetry: sinu bradycardia, upper 50's bpm Physical Exam: Blood pressure (!) 106/52, pulse (!) 58, temperature 98 F (36.7 C), temperature source Oral, resp. rate 16, height 6\' 2"  (1.88 m), weight 174 lb 8 oz (79.2 kg), SpO2 95 %. Body mass index is 22.4 kg/m. General: Well developed, well nourished, in no acute distress. Head: Normocephalic, atraumatic, sclera non-icteric, no xanthomas, nares are without discharge. Neck: Negative for carotid bruits. JVP not elevated. Lungs: Clear bilaterally to auscultation without wheezes, rales, or rhonchi. Breathing is unlabored. Heart: RRR S1 S2 without murmurs, rubs, or gallops.  Abdomen: Soft, non-tender, non-distended with normoactive bowel sounds. No rebound/guarding. Extremities:  No clubbing or cyanosis. No edema. Distal pedal pulses are 2+ and equal bilaterally. Neuro: Alert and oriented X 3. Moves all extremities spontaneously. Psych:  Responds to questions appropriately with a normal affect.   Intake/Output Summary (Last 24 hours) at 03/10/16 0859 Last data filed at 03/10/16 0631  Gross per 24 hour  Intake              240 ml  Output              100 ml  Net              140 ml    Inpatient Medications:  .  stroke: mapping our early stages of recovery book   Does not apply Once  . Carbidopa-Levodopa ER  1 tablet Oral BID  . carvedilol  3.125 mg Oral BID WC  . citalopram  5 mg Oral BID  . colesevelam  1,875 mg Oral BID  . ezetimibe  10 mg Oral Daily  . hydrALAZINE  25 mg Oral Q8H  . isosorbide mononitrate  30 mg Oral Daily  . omega-3 acid ethyl esters  1 g Oral Daily  . sodium bicarbonate  650 mg Oral BID   Infusions:    Labs:  Recent Labs  03/08/16 1217  NA 141  K 3.9  CL 105  CO2 28  GLUCOSE 104*  BUN 30*  CREATININE 1.75*  CALCIUM 10.0    Recent Labs  03/08/16 1217  AST 29  ALT 8*  ALKPHOS 105  BILITOT 0.4  PROT 8.0  ALBUMIN 4.2    Recent Labs  03/08/16 1217  WBC 8.8  HGB 14.3  HCT 42.0  MCV 99.0  PLT 205    Recent Labs  03/08/16 1217  TROPONINI <0.03   Invalid input(s): POCBNP No results for input(s): HGBA1C in  the last 72 hours.   Weights: Filed Weights   03/08/16 1209 03/08/16 1540  Weight: 178 lb (80.7 kg) 174 lb 8 oz (79.2 kg)     Radiology/Studies:  Mr Brain Wo Contrast  Result Date: 03/08/2016 CLINICAL DATA:  Tremors and balance issues for 8 months. Patient fell yesterday and hit head. EXAM: MRI HEAD WITHOUT CONTRAST TECHNIQUE: Multiplanar, multiecho pulse sequences of the brain and surrounding structures were obtained without intravenous contrast. COMPARISON:  MR brain 12/05/2015.  Also MR head 01/09/2009. FINDINGS: There is a spherical abnormality in the RIGHT thalamus, 11 mm diameter, with central  restriction (either due to ischemia or susceptibility), peripheral T1 and T2 hypointensity, marked peripheral hypo intensity on gradient sequence, as well as central T1 shortening on T1 weighted images. The findings are consistent with a subacute RIGHT thalamic hemorrhage, most likely hypertensive related. This is unlikely to represent a posttraumatic event despite the recent history of fall. No significant mass effect. Global atrophy with advanced chronic microvascular ischemic change similar to the April scan. These changes have progressed however since 2010. No hydrocephalus or extra-axial fluid. Gradient sequence demonstrates tiny areas of chronic hemorrhage, which were present previously, in the LEFT cerebellum and LEFT centrum semiovale periventricular white matter, stable. Flow voids are maintained. Pituitary and cerebellar tonsils unremarkable. No osseous findings. No layering sinus or mastoid fluid. Negative orbits. IMPRESSION: Subacute 11 mm RIGHT thalamic hematoma, likely hypertensive in origin. No significant mass effect. Sequelae of chronic hemorrhage in the brain, likely manifestation of chronic hypertensive cerebral vascular disease. Global atrophy and small vessel disease similar to previous MR 3 months ago. Findings discussed with on-call ordering provider. The patient is currently an outpatient, and this is an unexpected finding, therefore Dr. Melrose Nakayama as requested we escort the patient to the emergency department, where they can be assessed by the on-call neurologist. In addition, a noncontrast CT scan could be helpful as a baseline. Electronically Signed   By: Staci Righter M.D.   On: 03/08/2016 11:45  US Carotid Bilateral  Result Date: 03/08/2016 CLINICAL DATA:  Stroke. EXAM: BILATERAL CAROTID DUPLEX ULTRASOUND TECHNIQUE: Pearline Cables scale imaging, color Doppler and duplex ultrasound were performed of bilateral carotid and vertebral arteries in the neck. COMPARISON:  None. FINDINGS: Criteria:  Quantification of carotid stenosis is based on velocity parameters that correlate the residual internal carotid diameter with NASCET-based stenosis levels, using the diameter of the distal internal carotid lumen as the denominator for stenosis measurement. The following velocity measurements were obtained: RIGHT ICA:  55/17 cm/sec CCA:  [id number] cm/sec SYSTOLIC ICA/CCA RATIO:  0.9 DIASTOLIC ICA/CCA RATIO:  1.7 ECA:  79 cm/sec LEFT ICA:  41/11 cm/sec CCA:  [id number] cm/sec SYSTOLIC ICA/CCA RATIO:  0.7 DIASTOLIC ICA/CCA RATIO:  0.8 ECA:  67 cm/sec RIGHT CAROTID ARTERY: No significant plaque RIGHT VERTEBRAL ARTERY:  Antegrade flow LEFT CAROTID ARTERY:  Minimal plaque in the bulb LEFT VERTEBRAL ARTERY:  Antegrade flow IMPRESSION: No rate limiting stenosis in either carotid artery. Antegrade flow in the vertebral arteries. Minimal plaque in the left bulb. Electronically Signed   By: Dorise Bullion III M.D   On: 03/08/2016 16:48    Assessment and Plan  Active Problems:   Acute CVA (cerebrovascular accident) ([place])    1. Systolic dysfunction: -Echo in the setting of CVA with newly found cardiomyopathy with EF 25-30%. Apparently this is significantly changed from study in 2010. I cannot find this study for review.  -He is asymptomatic from a cardiac standpoint.  -Neurology recommends no aspirin  or Plavix x 5 day.  -Defer cardiac work up of his cardiomyopathy until cleared by neurology. For now, medically manage -Given his renal disease and asymptomatic nature he may be best suited to initially start with a Lexiscan Myoview and if this is abnormal proceed with cardiac cath. -Continue Coreg, Imdur/hydralazine -Not on spironolactone given CKD -Needs outpatient follow up after he sees neurology to establish timing of the above workup  2. 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 Nonobstructive CAD: -Never with angina -As  above  4. HTN: -Per neuro given stroke  5. CKD stage III: -BMET pending this morning -Per IM  Signed, Christell Faith, PA-C [place] Pager: [phone] 03/10/2016, 8:59 AM

## 2016-03-10 NOTE — Care Management Important Message (Signed)
Important Message  Patient Details  Name: Keith Woods MRN: [id number] Date of Birth: 1926/03/02   Medicare Important Message Given:  Yes    Jolly Mango, RN 03/10/2016, 8:58 AM

## 2016-03-10 NOTE — Telephone Encounter (Signed)
[facility] called to make a 2w fu for patient  For he was admitted he had a stroke I did not see anything in that time frame with Dr Martinique Would like to know where or how can we help patient so this can be done soon. Pt currently has an appointment 05/08/16 with Dr Martinique  But needs to be sooner Please advise.

## 2016-03-11 ENCOUNTER — Telehealth: Payer: Self-pay | Admitting: Family Medicine

## 2016-03-11 DIAGNOSIS — Z9181 History of falling: Secondary | ICD-10-CM | POA: Diagnosis not present

## 2016-03-11 DIAGNOSIS — G20 Parkinson's disease: Secondary | ICD-10-CM | POA: Diagnosis not present

## 2016-03-11 NOTE — Telephone Encounter (Signed)
Skeet Simmer was notified.

## 2016-03-11 NOTE — Telephone Encounter (Signed)
That's fine

## 2016-03-11 NOTE — Telephone Encounter (Signed)
Lemuel with [name] would like verbal orders as follows:  Twice a week for 2 weeks Once a week for 1 week  He also request orders for a nurse eval for home health care. Skeet Simmer stated that pt was admitted to the hospital Saturday 03/08/16 and was discharged on 03/10/16. Please advise. Thanks TNP

## 2016-03-11 NOTE — Telephone Encounter (Signed)
Returned call to patient.Appointment scheduled with Dr.Jordan 04/04/16 at 9:45 am.

## 2016-03-12 ENCOUNTER — Telehealth: Payer: Self-pay | Admitting: Family Medicine

## 2016-03-12 LAB — GLUCOSE, CAPILLARY: GLUCOSE-CAPILLARY: 120 mg/dL — AB (ref 65–99)

## 2016-03-12 NOTE — Telephone Encounter (Signed)
Dr. Caryn Section, this form is on your desk. Please review/ advise. Thanks

## 2016-03-12 NOTE — Telephone Encounter (Signed)
Keith Woods with Hospice calling checking the status of forms she sent on Monday, I asked her to please refax those forms to [place].  Thanks CC

## 2016-03-13 DIAGNOSIS — Z9181 History of falling: Secondary | ICD-10-CM | POA: Diagnosis not present

## 2016-03-13 DIAGNOSIS — G20 Parkinson's disease: Secondary | ICD-10-CM | POA: Diagnosis not present

## 2016-03-15 DIAGNOSIS — G20 Parkinson's disease: Secondary | ICD-10-CM | POA: Diagnosis not present

## 2016-03-15 DIAGNOSIS — Z9181 History of falling: Secondary | ICD-10-CM | POA: Diagnosis not present

## 2016-03-18 DIAGNOSIS — Z9181 History of falling: Secondary | ICD-10-CM | POA: Diagnosis not present

## 2016-03-18 DIAGNOSIS — G20 Parkinson's disease: Secondary | ICD-10-CM | POA: Diagnosis not present

## 2016-03-19 DIAGNOSIS — G20 Parkinson's disease: Secondary | ICD-10-CM | POA: Diagnosis not present

## 2016-03-20 NOTE — Progress Notes (Addendum)
Patient: Keith Woods Male    DOB: [birth year]   82 y.o.   MRN: [id number] Visit Date: 03/21/2016  Today's Provider: [name], MD   Chief Complaint  Patient presents with  . Hospitalization Follow-up   Subjective:    HPI   Follow up Hospitalization  Patient was admitted to [facility] on 03/08/2016 and discharged on 03/10/2016. He was treated for syncope and collapse. CVA Treatment for this included Echocardiogram revealed ejection fraction of 25-30%, moderate MR,grade 1 diastolic dysfunction.              Pt was started on Sinemet TID which could be                  contributing to him tremor as it is Dopamine                      agent and does cause orthostatic hypotension.                 His falls are mostly in afternoon.              Continue 8Am and 11 Pm dose Sinemet dose                  but 4pm dose discontinued per Neuro                                recommendation.              Essential hypertension: running low so will stop                all BP meds. Cardiomyopathy, ejection fraction of              25-30%, cardiology consultation was requested                for cardiac catheterization,              likely as outpatient,. He reports good compliance with treatment. He reports this condition is Improved.  ----------------------------------------------------------------    Allergies  Allergen Reactions  . Lipitor [Atorvastatin Calcium]   . Nsaids   . Statins    Current Meds  Medication Sig  . allopurinol (ZYLOPRIM) 100 MG tablet Take 2 tablets (200 mg total) by mouth daily.  Marland Kitchen aspirin EC 325 MG EC tablet Take 1 tablet (325 mg total) by mouth daily. Hold off till Wednesday 03/12/16 and resume it on 03/13/2016.  . Carbidopa-Levodopa ER (SINEMET CR) 25-100 MG tablet controlled release Take 1 tablet by mouth 2 (two) times daily.  . citalopram (CELEXA) 10 MG tablet Take 0.5 tablets (5 mg total) by mouth 2 (two) times daily.  . Colchicine (MITIGARE) 0.6 MG  CAPS Two tablets today, then 1 tablet daily (Patient taking differently: 1 capsule daily. One capsule daily for three days every 2 weeks)  . esomeprazole (NEXIUM) 40 MG capsule TAKE 1 CAPSULE DAILY  . fish oil-omega-3 fatty acids 1000 MG capsule Take 1,200 mg by mouth daily.   . Multiple Vitamin (MULTIVITAMIN) tablet Take 1 tablet by mouth daily.    . nitroGLYCERIN (NITROSTAT) 0.4 MG SL tablet Place 1 tablet (0.4 mg total) under the tongue every 5 (five) minutes as needed for chest pain.  . predniSONE (DELTASONE) 5 MG tablet take 1 to 2 tablets by mouth once daily  . Probiotic Product (ALIGN) 4  MG CAPS Take 4 mg by mouth daily.  . sodium bicarbonate 650 MG tablet Take 650 mg by mouth 2 (two) times daily.  . Vitamin D, Ergocalciferol, (DRISDOL) 50000 units CAPS capsule TAKE 1 CAPSULE ONCE A WEEK  . WELCHOL 625 MG tablet TAKE 3 TABLETS TWICE A DAY    Review of Systems  Constitutional: Negative for appetite change, chills and fever.  Respiratory: Negative for chest tightness, shortness of breath and wheezing.   Cardiovascular: Negative for chest pain and palpitations.  Gastrointestinal: Negative for abdominal pain, nausea and vomiting.    Social History  Substance Use Topics  . Smoking status: Never Smoker  . Smokeless tobacco: Never Used  . Alcohol use No   Objective:   BP 130/70 (BP Location: Right Arm, Patient Position: Sitting, Cuff Size: Normal)   Pulse 70   Temp 97.6 F (36.4 C) (Oral)   Wt 176 lb (79.8 kg)   SpO2 96%   BMI 22.60 kg/m   Physical Exam   General Appearance:    Alert, cooperative, no distress  Eyes:    PERRL, conjunctiva/corneas clear, EOM's intact       Lungs:     Clear to auscultation bilaterally, respirations unlabored  Heart:    Regular rate and rhythm  Neurologic:   Awake, alert, oriented x 3. Shuffled gait, LE strength +4 bilaterally. t.           Assessment & Plan:     1. Cerebral microvascular disease Extensive discussion regarding  optimization of stroke risks. BP has been well controlled. Has been intolerant to multiple statins in the past, but is willing to try another as below. Aggrenox has been discontinued due to recent finding of small hemorrhagic stroke. Will restart low dose aspirin alone.   2. Coronary artery disease involving native coronary artery of native heart without angina pectoris Asymptomatic. Compliant with medication.  Continue aggressive risk factor modification.    3. Weakness of both legs Continue physical therapy  4. Frequent falls Remains home bound due to limited mobility and fall risk. Required continued physical therapy.   5. Hyperlipidemia Intolerant to statins in the past. Will try low dose rosuvastatin       [name], MD  [facility]

## 2016-03-21 ENCOUNTER — Encounter: Payer: Self-pay | Admitting: Family Medicine

## 2016-03-21 ENCOUNTER — Ambulatory Visit (INDEPENDENT_AMBULATORY_CARE_PROVIDER_SITE_OTHER): Payer: Medicare Other | Admitting: Family Medicine

## 2016-03-21 VITALS — BP 130/70 | HR 70 | Temp 97.6°F | Wt 176.0 lb

## 2016-03-21 DIAGNOSIS — R296 Repeated falls: Secondary | ICD-10-CM

## 2016-03-21 DIAGNOSIS — R29898 Other symptoms and signs involving the musculoskeletal system: Secondary | ICD-10-CM

## 2016-03-21 DIAGNOSIS — I679 Cerebrovascular disease, unspecified: Secondary | ICD-10-CM | POA: Diagnosis not present

## 2016-03-21 DIAGNOSIS — I2510 Atherosclerotic heart disease of native coronary artery without angina pectoris: Secondary | ICD-10-CM | POA: Diagnosis not present

## 2016-03-21 DIAGNOSIS — Z9181 History of falling: Secondary | ICD-10-CM | POA: Diagnosis not present

## 2016-03-21 DIAGNOSIS — E785 Hyperlipidemia, unspecified: Secondary | ICD-10-CM | POA: Diagnosis not present

## 2016-03-21 DIAGNOSIS — I6789 Other cerebrovascular disease: Secondary | ICD-10-CM

## 2016-03-21 DIAGNOSIS — G20 Parkinson's disease: Secondary | ICD-10-CM | POA: Diagnosis not present

## 2016-03-21 DIAGNOSIS — I639 Cerebral infarction, unspecified: Secondary | ICD-10-CM

## 2016-03-21 MED ORDER — ROSUVASTATIN CALCIUM 5 MG PO TABS: 5.0000 mg | ORAL_TABLET | Freq: Every day | ORAL | 1 refills | Status: DC

## 2016-03-21 NOTE — Patient Instructions (Signed)
Check BP every evening and take amlodipine if over 130

## 2016-03-25 DIAGNOSIS — Z9181 History of falling: Secondary | ICD-10-CM | POA: Diagnosis not present

## 2016-03-25 DIAGNOSIS — G20 Parkinson's disease: Secondary | ICD-10-CM | POA: Diagnosis not present

## 2016-03-27 DIAGNOSIS — E872 Acidosis: Secondary | ICD-10-CM | POA: Diagnosis not present

## 2016-03-27 DIAGNOSIS — N184 Chronic kidney disease, stage 4 (severe): Secondary | ICD-10-CM | POA: Diagnosis not present

## 2016-03-27 DIAGNOSIS — G909 Disorder of the autonomic nervous system, unspecified: Secondary | ICD-10-CM | POA: Diagnosis not present

## 2016-03-27 DIAGNOSIS — N2581 Secondary hyperparathyroidism of renal origin: Secondary | ICD-10-CM | POA: Diagnosis not present

## 2016-03-28 ENCOUNTER — Ambulatory Visit: Payer: Medicare Other | Admitting: Family Medicine

## 2016-03-30 DIAGNOSIS — R29898 Other symptoms and signs involving the musculoskeletal system: Secondary | ICD-10-CM | POA: Insufficient documentation

## 2016-04-03 NOTE — Progress Notes (Signed)
Keith Woods Date of Birth: 20-Feb-1926 Medical Record [id number]  History of Present Illness: Keith Woods is seen for follow up of HTN, recent CVA, and new cardiomyopathy.  He has nonobstructive CAD per cath back in 2008, HLD, GERD, HTN and prostate cancer. He has had past issues with orthostatic dizziness on medication.   He was tried on losartan but this caused him to lose his balance. He took one dose of Pindolol but this dropped his BP too low. He is now taking low dose amlodipine 2.5 mg as needed.   He does report symptoms of dizziness over the past year. His symptoms are not orthostatic. He saw ENT who felt this was not BPV. He has no chest pain or SOB.  He was admitted in July with increased balance problems. He was found to have a subacute thalamic CVA with hemorrhagic conversion. Echo showed a new cardiomyopathy with EF 25-30%. He was asymptomatic. Medications were adjusted. Further ischemic evaluation was not pursued at that time due to recent CVA with hemorrhage. He has a chronic LBBB. He was DC on no antihypertensive medication due to low BP readings in the hospital. At home he gets BP readings from 110-150. It is labile. He is getting PT but still has leg weakness and imbalance. No edema.   Current Outpatient Prescriptions  Medication Sig Dispense Refill  . allopurinol (ZYLOPRIM) 100 MG tablet Take 2 tablets (200 mg total) by mouth daily. 60 tablet 5  . aspirin EC 325 MG EC tablet Take 1 tablet (325 mg total) by mouth daily. Hold off till Wednesday 03/12/16 and resume it on 03/13/2016. 30 tablet 0  . b complex vitamins tablet Take 1 tablet by mouth daily.    . Carbidopa-Levodopa ER (SINEMET CR) 25-100 MG tablet controlled release Take 1 tablet by mouth 2 (two) times daily. 60 tablet 0  . citalopram (CELEXA) 10 MG tablet Take 0.5 tablets (5 mg total) by mouth 2 (two) times daily. 90 tablet 4  . Colchicine (MITIGARE) 0.6 MG CAPS Two tablets today, then 1 tablet daily (Patient  taking differently: 1 capsule daily. One capsule daily for three days every 2 weeks) 30 capsule 3  . esomeprazole (NEXIUM) 40 MG capsule TAKE 1 CAPSULE DAILY 90 capsule 4  . fish oil-omega-3 fatty acids 1000 MG capsule Take 1,200 mg by mouth daily.     [name] (VASCEPA) 1 g CAPS Take 1 tablet by mouth daily. 30 capsule 0  . Multiple Vitamin (MULTIVITAMIN) tablet Take 1 tablet by mouth daily.      . nitroGLYCERIN (NITROSTAT) 0.4 MG SL tablet Place 1 tablet (0.4 mg total) under the tongue every 5 (five) minutes as needed for chest pain. 100 tablet 3  . predniSONE (DELTASONE) 5 MG tablet take 1 to 2 tablets by mouth once daily 60 tablet 0  . Probiotic Product (ALIGN) 4 MG CAPS Take 4 mg by mouth daily.    . rosuvastatin (CRESTOR) 5 MG tablet Take 1 tablet (5 mg total) by mouth daily. At night (start every other day for one month, then increase to one daily) 30 tablet 1  . rosuvastatin (CRESTOR) 5 MG tablet Take 5 mg by mouth daily.    . sodium bicarbonate 650 MG tablet Take 650 mg by mouth 2 (two) times daily.  0  . Vitamin D, Ergocalciferol, (DRISDOL) 50000 units CAPS capsule TAKE 1 CAPSULE ONCE A WEEK 12 capsule 4  . WELCHOL 625 MG tablet TAKE 3 TABLETS TWICE A DAY  540 tablet 4   No current facility-administered medications for this visit.     Allergies  Allergen Reactions  . Lipitor [Atorvastatin Calcium]   . Nsaids   . Statins     Past Medical History:  Diagnosis Date  . Coronary artery disease    NONOBSTRUCTIVE by cath 2008  . GERD (gastroesophageal reflux disease)   . History of prostate cancer   . History of transient ischemic attack (TIA)   . Hyperlipidemia   . Hypertension   . LBBB (left bundle branch block)   . Syncope and collapse     Past Surgical History:  Procedure Laterality Date  . CARDIAC CATHETERIZATION  08/28/2006   EF 60%  . TONSILLECTOMY    . TRANSTHORACIC ECHOCARDIOGRAM  10/10/2008   EF 55%    History  Smoking Status  . Never Smoker    Smokeless Tobacco  . Never Used    History  Alcohol Use No    Family History  Problem Relation Age of Onset  . Ovarian cancer Mother   . Prostate cancer Father     Review of Systems: The review of systems is per the HPI.  All other systems were reviewed and are negative.  Physical Exam: BP (!) 142/80 (BP Location: Left Arm, Patient Position: Sitting, Cuff Size: Normal)   Pulse 88   Ht 6\' 2"  (1.88 m)   Wt 181 lb 6 oz (82.3 kg)   BMI 23.29 kg/m  Patient is very pleasant and in no acute distress.  Skin is warm and dry. Color is normal.  HEENT is unremarkable.  Neck is supple. No masses. No JVD. Lungs are clear. Cardiac exam shows a regular rate and rhythm. Normal S1-2. no gallop or murmur. Abdomen is soft. Extremities are without edema. He walks with a cane.No gross neurologic deficits noted.  LABORATORY DATA:   Lab Results  Component Value Date   WBC 8.8 03/08/2016   HGB 14.3 03/08/2016   HCT 42.0 03/08/2016   PLT 205 03/08/2016   GLUCOSE 104 (H) 03/08/2016   CHOL 193 03/09/2016   TRIG 134 03/09/2016   HDL 37 (L) 03/09/2016   LDLCALC 129 (H) 03/09/2016   ALT 8 (L) 03/08/2016   AST 29 03/08/2016   NA 141 03/08/2016   K 3.9 03/08/2016   CL 105 03/08/2016   CREATININE 1.75 (H) 03/08/2016   BUN 30 (H) 03/08/2016   CO2 28 03/08/2016   TSH 0.813 Test methodology is 3rd generation TSH 10/10/2008   INR 1.02 03/08/2016   HGBA1C  10/10/2008    5.8 (NOTE)   The ADA recommends the following therapeutic goal for glycemic   control related to Hgb A1C measurement:   Goal of Therapy:   < 7.0% Hgb A1C   Reference: American Diabetes Association: Clinical Practice   Recommendations 2008, Diabetes Care,  2008, 31:(Suppl 1).     Echo: 03/08/16: Study Conclusions  - Left ventricle: The cavity size was normal. Systolic function was   severely reduced. The estimated ejection fraction was in the   range of 25% to 30%. Diffuse hypokinesis. Doppler parameters are   consistent with  abnormal left ventricular relaxation (grade 1   diastolic dysfunction). - Aortic valve: There was mild regurgitation. - Mitral valve: Transvalvular velocity was within the normal range.   There was no evidence for stenosis. There was trivial   regurgitation. - Right ventricle: The cavity size was normal. Wall thickness was   normal. Systolic function was normal. - Tricuspid valve:  There was trivial regurgitation.  Impressions:  - Compared with the echo in [id number], systolic function is   significantly reduced.    Assessment / Plan: 1. Cardiomyopathy. Newly diagnosed. EF 25-30% which is new from 2010. Asymptomatic. Will schedule for a Lexiscan myoview study to rule out ischemia and to reassess EF. Treatment options limited by labile/low BP and CKD. Not a candidate for ACEi or ARB. He is not volume overloaded so does not need diuresis. Depending on results of Myoview may try a very low dose of beta blocker again.   2. CAD - nonobstructive by cath in 2008. No symptoms reported.   3. HLD - on Welchol   4. Thalamic CVA with hemorrhage. Followed by Neurology.

## 2016-04-04 ENCOUNTER — Ambulatory Visit (INDEPENDENT_AMBULATORY_CARE_PROVIDER_SITE_OTHER): Payer: Medicare Other | Admitting: Cardiology

## 2016-04-04 ENCOUNTER — Encounter: Payer: Self-pay | Admitting: Cardiology

## 2016-04-04 VITALS — BP 142/80 | HR 88 | Ht 74.0 in | Wt 181.4 lb

## 2016-04-04 DIAGNOSIS — E785 Hyperlipidemia, unspecified: Secondary | ICD-10-CM

## 2016-04-04 DIAGNOSIS — I10 Essential (primary) hypertension: Secondary | ICD-10-CM

## 2016-04-04 DIAGNOSIS — I639 Cerebral infarction, unspecified: Secondary | ICD-10-CM | POA: Diagnosis not present

## 2016-04-04 DIAGNOSIS — N184 Chronic kidney disease, stage 4 (severe): Secondary | ICD-10-CM

## 2016-04-04 DIAGNOSIS — I447 Left bundle-branch block, unspecified: Secondary | ICD-10-CM

## 2016-04-04 NOTE — Patient Instructions (Signed)
We will schedule you for a Nuclear stress test   

## 2016-04-08 ENCOUNTER — Telehealth (HOSPITAL_COMMUNITY): Payer: Self-pay | Admitting: *Deleted

## 2016-04-08 DIAGNOSIS — G20 Parkinson's disease: Secondary | ICD-10-CM | POA: Diagnosis not present

## 2016-04-08 DIAGNOSIS — Z9181 History of falling: Secondary | ICD-10-CM | POA: Diagnosis not present

## 2016-04-08 NOTE — Telephone Encounter (Signed)
Close encounter 

## 2016-04-10 ENCOUNTER — Ambulatory Visit (HOSPITAL_COMMUNITY)
Admission: RE | Admit: 2016-04-10 | Discharge: 2016-04-10 | Disposition: A | Discharge status: Home / Self Care | Payer: Medicare Other | Source: Ambulatory Visit | Attending: Cardiology | Admitting: Cardiology

## 2016-04-10 DIAGNOSIS — I447 Left bundle-branch block, unspecified: Secondary | ICD-10-CM

## 2016-04-10 DIAGNOSIS — I2510 Atherosclerotic heart disease of native coronary artery without angina pectoris: Secondary | ICD-10-CM | POA: Diagnosis not present

## 2016-04-10 DIAGNOSIS — N184 Chronic kidney disease, stage 4 (severe): Secondary | ICD-10-CM | POA: Diagnosis not present

## 2016-04-10 DIAGNOSIS — E785 Hyperlipidemia, unspecified: Secondary | ICD-10-CM

## 2016-04-10 DIAGNOSIS — I119 Hypertensive heart disease without heart failure: Secondary | ICD-10-CM | POA: Diagnosis not present

## 2016-04-10 DIAGNOSIS — R42 Dizziness and giddiness: Secondary | ICD-10-CM | POA: Insufficient documentation

## 2016-04-10 DIAGNOSIS — I10 Essential (primary) hypertension: Secondary | ICD-10-CM

## 2016-04-10 DIAGNOSIS — I639 Cerebral infarction, unspecified: Secondary | ICD-10-CM | POA: Diagnosis not present

## 2016-04-10 DIAGNOSIS — R9439 Abnormal result of other cardiovascular function study: Secondary | ICD-10-CM | POA: Diagnosis not present

## 2016-04-10 MED ORDER — REGADENOSON 0.4 MG/5ML IV SOLN
0.4000 mg | Freq: Once | INTRAVENOUS | Status: AC
  Administered 2016-04-10: 0.4 mg via INTRAVENOUS

## 2016-04-10 MED ORDER — TECHNETIUM TC 99M TETROFOSMIN IV KIT
31.7000 | PACK | Freq: Once | INTRAVENOUS | Status: AC | PRN
  Administered 2016-04-10: 31.7 via INTRAVENOUS
  Filled 2016-04-10: qty 32

## 2016-04-10 MED ORDER — TECHNETIUM TC 99M TETROFOSMIN IV KIT
10.6000 | PACK | Freq: Once | INTRAVENOUS | Status: AC | PRN
  Administered 2016-04-10: 10.6 via INTRAVENOUS
  Filled 2016-04-10: qty 11

## 2016-04-11 ENCOUNTER — Other Ambulatory Visit: Payer: Self-pay

## 2016-04-11 LAB — MYOCARDIAL PERFUSION IMAGING
CHL CUP RESTING HR STRESS: 71 {beats}/min
CSEPPHR: 96 {beats}/min
LV sys vol: 125 mL
LVDIAVOL: 169 mL (ref 62–150)
SDS: 0
SRS: 14
SSS: 14
TID: 1.03

## 2016-04-11 MED ORDER — CARVEDILOL 3.125 MG PO TABS: ORAL_TABLET | ORAL | 6 refills | Status: DC

## 2016-04-12 ENCOUNTER — Telehealth: Payer: Self-pay | Admitting: Physician Assistant

## 2016-04-12 NOTE — Telephone Encounter (Signed)
Patient called answering service on Saturday - recently started on low dose carvedilol (3.125mg  daily to start) per nuc result. Took first dose last night, shortly after had an episode of vomiting and diarrhea. BP [id number] systolic last night. BP 97 systolic this AM - feels fine but was told he should call if BP is low on the new med. I advised he hold further doses and told him I would forward this message to [name] and his nurse to determine if they want any alternative med. I told him if nausea/diarrhea should return this is not likely related to the carvedilol and he should touch base with his PMD. He will also continue to follow BP at home and call if running low. Caron Ode PA-C

## 2016-04-13 NOTE — Telephone Encounter (Signed)
Not sure if symptoms related to coreg. Only one dose. Could try Toprol XL 25 mg one half tablet daily. This would have less effect on BP  Tallie Hevia Martinique MD, [facility]

## 2016-04-15 MED ORDER — METOPROLOL SUCCINATE ER 25 MG PO TB24: ORAL_TABLET | ORAL | 6 refills | Status: DC

## 2016-04-15 NOTE — Telephone Encounter (Signed)
Spoke to patient.Dr.Jordan advised stop Coreg.Start Toprol XL 25 mg take 1/2 tablet daily.Advised to monitor B/P and bring B/P diary  to appointment with Dr.Jordan 04/28/16 at 11:15 am.

## 2016-04-23 ENCOUNTER — Encounter: Payer: Self-pay | Admitting: Family Medicine

## 2016-04-23 ENCOUNTER — Ambulatory Visit (INDEPENDENT_AMBULATORY_CARE_PROVIDER_SITE_OTHER): Payer: Medicare Other | Admitting: Family Medicine

## 2016-04-23 VITALS — BP 110/60 | HR 68 | Temp 97.6°F | Resp 16 | Ht 74.0 in | Wt 180.0 lb

## 2016-04-23 DIAGNOSIS — E785 Hyperlipidemia, unspecified: Secondary | ICD-10-CM

## 2016-04-23 DIAGNOSIS — Z23 Encounter for immunization: Secondary | ICD-10-CM | POA: Diagnosis not present

## 2016-04-23 DIAGNOSIS — I6789 Other cerebrovascular disease: Secondary | ICD-10-CM

## 2016-04-23 DIAGNOSIS — I259 Chronic ischemic heart disease, unspecified: Secondary | ICD-10-CM | POA: Diagnosis not present

## 2016-04-23 DIAGNOSIS — Z8673 Personal history of transient ischemic attack (TIA), and cerebral infarction without residual deficits: Secondary | ICD-10-CM | POA: Diagnosis not present

## 2016-04-23 DIAGNOSIS — I519 Heart disease, unspecified: Secondary | ICD-10-CM | POA: Diagnosis not present

## 2016-04-23 DIAGNOSIS — I679 Cerebrovascular disease, unspecified: Secondary | ICD-10-CM | POA: Diagnosis not present

## 2016-04-23 NOTE — Progress Notes (Signed)
Patient: Keith Woods Male    DOB: Jun 16, 1926   82 y.o.   MRN: [id number] Visit Date: 04/23/2016  Today's Provider: [name], MD   Chief Complaint  Patient presents with  . Hypertension  . Hyperlipidemia  . Dizziness   Subjective:    HPI ------------------------------------------------------------------------   Lipid/Cholesterol, Follow-up:   Last seen for this1 months ago.  Management changes since that visit include adding low dose Rosuvastatin and advised to take a low dose ASA. Marland Kitchen Last Lipid Panel:    Component Value Date/Time   CHOL 193 03/09/2016 0530   TRIG 134 03/09/2016 0530   HDL 37 (L) 03/09/2016 0530   CHOLHDL 5.2 03/09/2016 0530   VLDL 27 03/09/2016 0530   LDLCALC 129 (H) 03/09/2016 0530    Risk factors for vascular disease include hypercholesterolemia and hypertension  He reports fair compliance with treatment.  He states he took Crestor for a week or stop, but he stopped because he is also taking Welchol and wasn't sure if he should be taking 2 cholesterol medications.  Patient is still taking ASA 325 mg daily.  Since last visit her had follow up with [name] and had Myocardial perfusion scan revealing significant systolic dysfunction and old inferior MI. He was prescribed carvedilol but stopped after one dose due to low blood pressure. Was then changed to metoprolol 1/2 tablet daily which he is taking consistently, but has some dizziness when he gets up and moves. Had rarely takes amlodipine, only if his SBP gets over 160. He has follow up Martinique 9/18.  -------------------------------------------------------------------     Allergies  Allergen Reactions  . Lipitor [Atorvastatin Calcium]   . Nsaids   . Statins      Current Outpatient Prescriptions:  .  allopurinol (ZYLOPRIM) 100 MG tablet, Take 2 tablets (200 mg total) by mouth daily., Disp: 60 tablet, Rfl: 5 .  aspirin EC 325 MG EC tablet, Take 1 tablet (325 mg total) by  mouth daily. Hold off till Wednesday 03/12/16 and resume it on 03/13/2016., Disp: 30 tablet, Rfl: 0 .  b complex vitamins tablet, Take 1 tablet by mouth daily., Disp: , Rfl:  .  Carbidopa-Levodopa ER (SINEMET CR) 25-100 MG tablet controlled release, Take 1 tablet by mouth 2 (two) times daily., Disp: 60 tablet, Rfl: 0 .  citalopram (CELEXA) 10 MG tablet, Take 0.5 tablets (5 mg total) by mouth 2 (two) times daily., Disp: 90 tablet, Rfl: 4 .  Colchicine (MITIGARE) 0.6 MG CAPS, Two tablets today, then 1 tablet daily (Patient taking differently: 1 capsule daily. One capsule daily for three days every 2 weeks), Disp: 30 capsule, Rfl: 3 .  esomeprazole (NEXIUM) 40 MG capsule, TAKE 1 CAPSULE DAILY, Disp: 90 capsule, Rfl: 4 .  fish oil-omega-3 fatty acids 1000 MG capsule, Take 1,200 mg by mouth daily. , Disp: , Rfl:  .  Icosapent Ethyl (VASCEPA) 1 g CAPS, Take 1 tablet by mouth daily., Disp: 30 capsule, Rfl: 0 .  metoprolol succinate (TOPROL XL) 25 MG 24 hr tablet, Take 1/2 tablet daily, Disp: 30 tablet, Rfl: 6 .  Multiple Vitamin (MULTIVITAMIN) tablet, Take 1 tablet by mouth daily.  , Disp: , Rfl:  .  nitroGLYCERIN (NITROSTAT) 0.4 MG SL tablet, Place 1 tablet (0.4 mg total) under the tongue every 5 (five) minutes as needed for chest pain., Disp: 100 tablet, Rfl: 3 .  predniSONE (DELTASONE) 5 MG tablet, take 1 to 2 tablets by mouth once daily, Disp: 60  tablet, Rfl: 0 .  Probiotic Product (ALIGN) 4 MG CAPS, Take 4 mg by mouth daily., Disp: , Rfl:  .  rosuvastatin (CRESTOR) 5 MG tablet, Take 5 mg by mouth daily., Disp: , Rfl:  .  sodium bicarbonate 650 MG tablet, Take 650 mg by mouth 2 (two) times daily., Disp: , Rfl: 0 .  Vitamin D, Ergocalciferol, (DRISDOL) 50000 units CAPS capsule, TAKE 1 CAPSULE ONCE A WEEK, Disp: 12 capsule, Rfl: 4 .  WELCHOL 625 MG tablet, TAKE 3 TABLETS TWICE A DAY, Disp: 540 tablet, Rfl: 4  Review of Systems  Constitutional: Negative.   Cardiovascular: Negative.   Endocrine: Negative.    Neurological: Positive for dizziness.    Social History  Substance Use Topics  . Smoking status: Never Smoker  . Smokeless tobacco: Never Used  . Alcohol use No   Objective:   BP 110/60 (BP Location: Right Arm, Patient Position: Sitting, Cuff Size: Large)   Pulse 68   Temp 97.6 F (36.4 C) (Oral)   Resp 16   Ht 6\' 2"  (1.88 m)   Wt 180 lb (81.6 kg)   SpO2 97%   BMI 23.11 kg/m   Physical Exam   General Appearance:    Alert, cooperative, no distress  Eyes:    PERRL, conjunctiva/corneas clear, EOM's intact       Lungs:     Clear to auscultation bilaterally, respirations unlabored  Heart:    Regular rate and rhythm  Neurologic:   Awake, alert, oriented x 3. No apparent focal neurological           defect.           Assessment & Plan:     1. Need for influenza vaccination  - Flu vaccine HIGH DOSE PF  2. Cerebral microvascular disease On 325 mg ECASA daily. 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 Brigitte Pulse as scheduled.   3. Hyperlipidemia Long discussion regard benefits of statins in reducing vascular events and to get back on rosuvastatin which he seemed to be tolerating. Anticipate titrated up to highest dose he can tolerate. Continue Welchol for better cholesterol control uless he is able to titrate to high intensity dose of rosuvastatin  4. Severe left ventricular systolic dysfunction His dizziness is likely effect of beta-blocker. Extensively counseled on benefit of beta-blockers with systolic dysfunction and discusses strategies to minimized adverse effects.   5. Ischemic heart disease Follow up [name] later this month as scheduled.   Return in about 12 weeks (around 07/16/2016).        [name], MD  [facility]

## 2016-05-07 NOTE — Progress Notes (Signed)
Keith Woods Date of Birth: Mar 04, 1926 Medical Record [id number]  History of Present Illness: Keith Woods is seen for follow up of HTN,  CVA, and new cardiomyopathy.  He has nonobstructive CAD per cath back in 2008, HLD, GERD, HTN and prostate cancer. Echo in 2010 showed normal LV function with EF 55%. He has had past issues with orthostatic dizziness on medication.   He was tried on losartan but this caused him to lose his balance. He took one dose of Pindolol but this dropped his BP too low.   He does report symptoms of dizziness and imbalance over the past year.  He has no chest pain or SOB.  He was admitted in July 2017 with increased balance problems. He was found to have a subacute thalamic CVA with hemorrhagic conversion. Echo showed a new cardiomyopathy with EF 25-30%. He was asymptomatic. Medications were adjusted. Further ischemic evaluation was not pursued at that time due to recent CVA with hemorrhage. He has a chronic LBBB. He was DC on no antihypertensive medication due to low BP readings in the hospital.No edema.  In August he had a [place] study demonstrating a large inferior infarct without ischemia and EF 26%. This suggested that his cardiomyopathy was ischemic. He was started on low dose Coreg but developed hypotension after one dose. He was switched to Toprol XL 12.5 mg daily which he has been able to take. He still denies any SOB or edema. Only one episode of chest pain relieved with Nexium. BP at home has been [id number] systolic.    Current Outpatient Prescriptions  Medication Sig Dispense Refill  . allopurinol (ZYLOPRIM) 100 MG tablet Take 2 tablets (200 mg total) by mouth daily. 60 tablet 5  . aspirin EC 325 MG EC tablet Take 1 tablet (325 mg total) by mouth daily. Hold off till Wednesday 03/12/16 and resume it on 03/13/2016. 30 tablet 0  . b complex vitamins tablet Take 1 tablet by mouth daily.    . Carbidopa-Levodopa ER (SINEMET CR) 25-100 MG tablet controlled  release Take 1 tablet by mouth 2 (two) times daily. 60 tablet 0  . citalopram (CELEXA) 10 MG tablet Take 0.5 tablets (5 mg total) by mouth 2 (two) times daily. 90 tablet 4  . Colchicine (MITIGARE) 0.6 MG CAPS Two tablets today, then 1 tablet daily 30 capsule 3  . esomeprazole (NEXIUM) 40 MG capsule TAKE 1 CAPSULE DAILY 90 capsule 4  . fish oil-omega-3 fatty acids 1000 MG capsule Take 1,200 mg by mouth daily.     Keith Woods (VASCEPA) 1 g CAPS Take 1 tablet by mouth daily. 30 capsule 0  . metoprolol succinate (TOPROL XL) 25 MG 24 hr tablet Take 1 tablet (25 mg total) by mouth daily. Take 1/2 tablet daily 30 tablet 11  . Multiple Vitamin (MULTIVITAMIN) tablet Take 1 tablet by mouth daily.      . nitroGLYCERIN (NITROSTAT) 0.4 MG SL tablet Place 1 tablet (0.4 mg total) under the tongue every 5 (five) minutes as needed for chest pain. 100 tablet 3  . predniSONE (DELTASONE) 5 MG tablet take 1 to 2 tablets by mouth once daily 60 tablet 0  . Probiotic Product (ALIGN) 4 MG CAPS Take 4 mg by mouth daily.    . rosuvastatin (CRESTOR) 5 MG tablet Take 5 mg by mouth daily.    . sodium bicarbonate 650 MG tablet Take 650 mg by mouth 2 (two) times daily.  0  . Vitamin D, Ergocalciferol, (DRISDOL) 50000  units CAPS capsule TAKE 1 CAPSULE ONCE A WEEK 12 capsule 4  . WELCHOL 625 MG tablet TAKE 3 TABLETS TWICE A DAY 540 tablet 4   No current facility-administered medications for this visit.     Allergies  Allergen Reactions  . Lipitor [Atorvastatin Calcium]   . Nsaids   . Statins     Past Medical History:  Diagnosis Date  . Coronary artery disease    NONOBSTRUCTIVE by cath 2008  . GERD (gastroesophageal reflux disease)   . History of prostate cancer   . History of transient ischemic attack (TIA)   . Hyperlipidemia   . Hypertension   . LBBB (left bundle branch block)   . Syncope and collapse     Past Surgical History:  Procedure Laterality Date  . CARDIAC CATHETERIZATION  08/28/2006   EF 60%    . TONSILLECTOMY    . TRANSTHORACIC ECHOCARDIOGRAM  10/10/2008   EF 55%    History  Smoking Status  . Never Smoker  Smokeless Tobacco  . Never Used    History  Alcohol Use No    Family History  Problem Relation Age of Onset  . Ovarian cancer Mother   . Prostate cancer Father     Review of Systems: The review of systems is per the HPI.  All other systems were reviewed and are negative.  Physical Exam: BP 139/76   Pulse 62   Ht 6\' 2"  (1.88 m)   Wt 178 lb 9.6 oz (81 kg)   SpO2 99%   BMI 22.93 kg/m  Patient is very pleasant and in no acute distress.  Skin is warm and dry. Color is normal.  HEENT is unremarkable.  Neck is supple. No masses. No JVD. Lungs are clear. Cardiac exam shows a regular rate and rhythm. Normal S1-2. no gallop or murmur. Abdomen is soft. Extremities are without edema. Pulses 2+. He walks with a cane.No gross neurologic deficits noted.  LABORATORY DATA:   Lab Results   Lab Results  Component Value Date   WBC 8.8 03/08/2016   HGB 14.3 03/08/2016   HCT 42.0 03/08/2016   PLT 205 03/08/2016   GLUCOSE 104 (H) 03/08/2016   CHOL 193 03/09/2016   TRIG 134 03/09/2016   HDL 37 (L) 03/09/2016   LDLCALC 129 (H) 03/09/2016   ALT 8 (L) 03/08/2016   AST 29 03/08/2016   NA 141 03/08/2016   K 3.9 03/08/2016   CL 105 03/08/2016   CREATININE 1.75 (H) 03/08/2016   BUN 30 (H) 03/08/2016   CO2 28 03/08/2016   TSH 0.813 Test methodology is 3rd generation TSH 10/10/2008   INR 1.02 03/08/2016   HGBA1C  10/10/2008    5.8 (NOTE)   The ADA recommends the following therapeutic goal for glycemic   control related to Hgb A1C measurement:   Goal of Therapy:   < 7.0% Hgb A1C   Reference: American Diabetes Association: Clinical Practice   Recommendations 2008, Diabetes Care,  2008, 31:(Suppl 1).     Echo: 03/08/16: Study Conclusions  - Left ventricle: The cavity size was normal. Systolic function was   severely reduced. The estimated ejection fraction was in the    range of 25% to 30%. Diffuse hypokinesis. Doppler parameters are   consistent with abnormal left ventricular relaxation (grade 1   diastolic dysfunction). - Aortic valve: There was mild regurgitation. - Mitral valve: Transvalvular velocity was within the normal range.   There was no evidence for stenosis. There was trivial  regurgitation. - Right ventricle: The cavity size was normal. Wall thickness was   normal. Systolic function was normal. - Tricuspid valve: There was trivial regurgitation.  Impressions:  - Compared with the echo in [id number], systolic function is   significantly reduced.  Myoview study 04/10/16: Study Highlights     The left ventricular ejection fraction is severely decreased (<30%).  Nuclear stress EF: 26%.  Defect 1: There is a large defect of severe severity present in the basal inferoseptal, basal inferior, mid inferoseptal, mid inferior, apical septal, apical inferior and apex location.  Findings consistent with prior myocardial infarction.   There is a large scar in the RCA territory, LVEF is severely decreased at 26%. No ischemia.     Assessment / Plan: 1. Cardiomyopathy. Newly diagnosed. EF 25-30% which is new from 2010. Asymptomatic. Myoview suggests this is ischemic in origin. EF normal in 2010. Treatment options limited by labile/low BP and CKD. Not a candidate for ACEi or ARB. He is not volume overloaded so does not need diuresis. On very low dose Toprol XL. Will increase Toprol XL to 25 mg daily. Since he is asymptomatic I would not recommend invasive evaluation at this time and at his age. If he should develop more angina then we could reconsider.  2. CAD - nonobstructive by cath in 2008. No symptoms reported.   3. HLD - on Welchol   4. Thalamic CVA with hemorrhage.

## 2016-05-08 ENCOUNTER — Ambulatory Visit (INDEPENDENT_AMBULATORY_CARE_PROVIDER_SITE_OTHER): Payer: Medicare Other | Admitting: Cardiology

## 2016-05-08 ENCOUNTER — Encounter: Payer: Self-pay | Admitting: Cardiology

## 2016-05-08 VITALS — BP 139/76 | HR 62 | Ht 74.0 in | Wt 178.6 lb

## 2016-05-08 DIAGNOSIS — I2510 Atherosclerotic heart disease of native coronary artery without angina pectoris: Secondary | ICD-10-CM

## 2016-05-08 DIAGNOSIS — I259 Chronic ischemic heart disease, unspecified: Secondary | ICD-10-CM | POA: Diagnosis not present

## 2016-05-08 DIAGNOSIS — I10 Essential (primary) hypertension: Secondary | ICD-10-CM | POA: Diagnosis not present

## 2016-05-08 DIAGNOSIS — I255 Ischemic cardiomyopathy: Secondary | ICD-10-CM

## 2016-05-08 DIAGNOSIS — E785 Hyperlipidemia, unspecified: Secondary | ICD-10-CM

## 2016-05-08 DIAGNOSIS — I447 Left bundle-branch block, unspecified: Secondary | ICD-10-CM

## 2016-05-08 MED ORDER — METOPROLOL SUCCINATE ER 25 MG PO TB24: 25.0000 mg | ORAL_TABLET | Freq: Every day | ORAL | 11 refills | Status: DC

## 2016-05-08 NOTE — Patient Instructions (Signed)
Increase Toprol XL to 25 mg daily  Continue your other therapy  I will see you in 6 months.

## 2016-05-21 ENCOUNTER — Telehealth: Payer: Self-pay | Admitting: Family Medicine

## 2016-05-21 NOTE — Telephone Encounter (Signed)
Please check with patient to see if he has started back on Crestor 5mg  and if he is tolerating it. We would like to titrate dose up to 10mg  if he is tolerating medication.

## 2016-05-21 NOTE — Telephone Encounter (Signed)
Patient stated that he feels like he's doing well on Crestor 5 mg.

## 2016-05-22 MED ORDER — ROSUVASTATIN CALCIUM 10 MG PO TABS: 10.0000 mg | ORAL_TABLET | Freq: Every day | ORAL | 5 refills | Status: DC

## 2016-05-22 NOTE — Telephone Encounter (Signed)
Please advise patient have sent in rx for 10mg  Crestor which will reduce risk of having any more strokes. Let me know if he has any side effects from this.

## 2016-05-23 NOTE — Telephone Encounter (Signed)
Patient was notified.

## 2016-06-05 ENCOUNTER — Encounter: Payer: Self-pay | Admitting: Family Medicine

## 2016-06-05 ENCOUNTER — Ambulatory Visit (INDEPENDENT_AMBULATORY_CARE_PROVIDER_SITE_OTHER): Payer: Medicare Other | Admitting: Family Medicine

## 2016-06-05 VITALS — BP 110/60 | HR 93 | Temp 97.9°F | Resp 16 | Wt 181.0 lb

## 2016-06-05 DIAGNOSIS — E785 Hyperlipidemia, unspecified: Secondary | ICD-10-CM

## 2016-06-05 DIAGNOSIS — I259 Chronic ischemic heart disease, unspecified: Secondary | ICD-10-CM

## 2016-06-05 DIAGNOSIS — R29898 Other symptoms and signs involving the musculoskeletal system: Secondary | ICD-10-CM

## 2016-06-05 DIAGNOSIS — M109 Gout, unspecified: Secondary | ICD-10-CM

## 2016-06-05 MED ORDER — PREDNISONE 5 MG PO TABS: ORAL_TABLET | ORAL | 0 refills | Status: DC

## 2016-06-05 NOTE — Patient Instructions (Addendum)
   Start taking Coenzyme Q10 200mg  every day to help with muscle soreness and weakness

## 2016-06-05 NOTE — Progress Notes (Signed)
Patient: Keith Woods Foronda Male    DOB: 1925-08-13   83 y.o.   MRN: [id number] Visit Date: 06/05/2016  Today's Provider: [name], MD   Chief Complaint  Patient presents with  . Foot Pain  . Foot Swelling   Subjective:    Patient is having recurrent right leg pain and left foot swelling. Patient has been seen in the past for these symptoms.   He states left foot was swollen and painful when he woke up yesterday. It is much better today. Leg pain has been persistent and his wife states it has worsened since starting rosuvastatin. He had been on prednisone for suspected PMR and had been doing well with it, but has been out for the last few monhts.      Allergies  Allergen Reactions  . Lipitor [Atorvastatin Calcium]   . Nsaids   . Statins      Current Outpatient Prescriptions:  .  allopurinol (ZYLOPRIM) 100 MG tablet, Take 2 tablets (200 mg total) by mouth daily., Disp: 60 tablet, Rfl: 5 .  aspirin EC 325 MG EC tablet, Take 1 tablet (325 mg total) by mouth daily. Hold off till Wednesday 03/12/16 and resume it on 03/13/2016., Disp: 30 tablet, Rfl: 0 .  b complex vitamins tablet, Take 1 tablet by mouth daily., Disp: , Rfl:  .  Carbidopa-Levodopa ER (SINEMET CR) 25-100 MG tablet controlled release, Take 1 tablet by mouth 2 (two) times daily., Disp: 60 tablet, Rfl: 0 .  citalopram (CELEXA) 10 MG tablet, Take 0.5 tablets (5 mg total) by mouth 2 (two) times daily., Disp: 90 tablet, Rfl: 4 .  Colchicine (MITIGARE) 0.6 MG CAPS, Two tablets today, then 1 tablet daily, Disp: 30 capsule, Rfl: 3 .  esomeprazole (NEXIUM) 40 MG capsule, TAKE 1 CAPSULE DAILY, Disp: 90 capsule, Rfl: 4 .  fish oil-omega-3 fatty acids 1000 MG capsule, Take 1,200 mg by mouth daily. , Disp: , Rfl:  .  Icosapent Ethyl (VASCEPA) 1 g CAPS, Take 1 tablet by mouth daily., Disp: 30 capsule, Rfl: 0 .  metoprolol succinate (TOPROL XL) 25 MG 24 hr tablet, Take 1 tablet (25 mg total) by mouth daily. Take 1/2 tablet  daily, Disp: 30 tablet, Rfl: 11 .  Multiple Vitamin (MULTIVITAMIN) tablet, Take 1 tablet by mouth daily.  , Disp: , Rfl:  .  nitroGLYCERIN (NITROSTAT) 0.4 MG SL tablet, Place 1 tablet (0.4 mg total) under the tongue every 5 (five) minutes as needed for chest pain., Disp: 100 tablet, Rfl: 3 .  predniSONE (DELTASONE) 5 MG tablet, take 1 to 2 tablets by mouth once daily, Disp: 60 tablet, Rfl: 0 .  Probiotic Product (ALIGN) 4 MG CAPS, Take 4 mg by mouth daily., Disp: , Rfl:  .  rosuvastatin (CRESTOR) 10 MG tablet, Take 1 tablet (10 mg total) by mouth daily., Disp: 30 tablet, Rfl: 5 .  sodium bicarbonate 650 MG tablet, Take 650 mg by mouth 2 (two) times daily., Disp: , Rfl: 0 .  Vitamin D, Ergocalciferol, (DRISDOL) 50000 units CAPS capsule, TAKE 1 CAPSULE ONCE A WEEK, Disp: 12 capsule, Rfl: 4 .  WELCHOL 625 MG tablet, TAKE 3 TABLETS TWICE A DAY, Disp: 540 tablet, Rfl: 4  Review of Systems  Constitutional: Negative for appetite change, chills and fever.  Respiratory: Negative for chest tightness, shortness of breath and wheezing.   Cardiovascular: Positive for leg swelling. Negative for chest pain and palpitations.  Gastrointestinal: Negative for abdominal pain, nausea and vomiting.  Social History  Substance Use Topics  . Smoking status: Never Smoker  . Smokeless tobacco: Never Used  . Alcohol use No   Objective:   BP 110/60 (BP Location: Right Arm, Patient Position: Sitting, Cuff Size: Normal)   Pulse 93   Temp 97.9 F (36.6 C) (Oral)   Resp 16   Wt 181 lb (82.1 kg)   SpO2 99%   BMI 23.24 kg/m   Physical Exam  General appearance: alert, well developed, well nourished, cooperative and in no distress Head: Normocephalic, without obvious abnormality, atraumatic Respiratory: Respirations even and unlabored, normal respiratory rate Extremities: Tender left lateral ankle, minimal swelling, no erythema. FROM Tender lateral aspect right hip. +4 muscle strength.      Assessment & Plan:      1. Gout of left ankle, unspecified cause, unspecified chronicity Suspect mild flare which is improved today. Check uric acid and start back on prednisone with slow taper to 5mg  maintenance dose.  - Uric acid - predniSONE (DELTASONE) 5 MG tablet; Take 4 tablets daily for 4 days, then 3 daily for 4 days, then 2 daily for 4 days, then 1 daily thereafter  Dispense: 60 tablet; Refill: 0  2. Hyperlipidemia, unspecified hyperlipidemia type Is back on statin, but wife feels legs have been weaker since starting rosuvastatin. Recommend he start CoQ10 200mg  a day. If lipids are much bett will consider discontinuation go fish oil and Wellchol.  - Lipid panel - Hepatic function panel  3. Weakness of both legs Multifactorial, hope for improvement on prednisone and CoQ10       [name], MD  [facility]

## 2016-06-06 LAB — URIC ACID: Uric Acid: 5.6 mg/dL (ref 3.7–8.6)

## 2016-06-06 LAB — HEPATIC FUNCTION PANEL
ALT: 18 IU/L (ref 0–44)
AST: 25 IU/L (ref 0–40)
Albumin: 4.2 g/dL (ref 3.5–4.7)
Alkaline Phosphatase: 94 IU/L (ref 39–117)
BILIRUBIN, DIRECT: 0.15 mg/dL (ref 0.00–0.40)
Bilirubin Total: 0.4 mg/dL (ref 0.0–1.2)
TOTAL PROTEIN: 7.3 g/dL (ref 6.0–8.5)

## 2016-06-06 LAB — LIPID PANEL
CHOL/HDL RATIO: 3.1 ratio (ref 0.0–5.0)
Cholesterol, Total: 142 mg/dL (ref 100–199)
HDL: 46 mg/dL (ref >39)
LDL Calculated: 72 mg/dL (ref 0–99)
Triglycerides: 118 mg/dL (ref 0–149)
VLDL Cholesterol Cal: 24 mg/dL (ref 5–40)

## 2016-06-18 DIAGNOSIS — N184 Chronic kidney disease, stage 4 (severe): Secondary | ICD-10-CM | POA: Diagnosis not present

## 2016-06-18 DIAGNOSIS — I129 Hypertensive chronic kidney disease with stage 1 through stage 4 chronic kidney disease, or unspecified chronic kidney disease: Secondary | ICD-10-CM | POA: Diagnosis not present

## 2016-06-18 DIAGNOSIS — M109 Gout, unspecified: Secondary | ICD-10-CM | POA: Diagnosis not present

## 2016-06-18 DIAGNOSIS — E872 Acidosis: Secondary | ICD-10-CM | POA: Diagnosis not present

## 2016-06-18 DIAGNOSIS — R809 Proteinuria, unspecified: Secondary | ICD-10-CM | POA: Diagnosis not present

## 2016-06-18 DIAGNOSIS — N2581 Secondary hyperparathyroidism of renal origin: Secondary | ICD-10-CM | POA: Diagnosis not present

## 2016-06-23 ENCOUNTER — Emergency Department: Payer: Medicare Other

## 2016-06-23 ENCOUNTER — Encounter: Payer: Self-pay | Admitting: Emergency Medicine

## 2016-06-23 ENCOUNTER — Observation Stay
Admission: EM | Admit: 2016-06-23 | Discharge: 2016-06-28 | Disposition: A | Discharge status: Home / Self Care | Payer: Medicare Other | Attending: Family Medicine | Admitting: Family Medicine

## 2016-06-23 DIAGNOSIS — Z8546 Personal history of malignant neoplasm of prostate: Secondary | ICD-10-CM | POA: Diagnosis not present

## 2016-06-23 DIAGNOSIS — I5022 Chronic systolic (congestive) heart failure: Secondary | ICD-10-CM | POA: Diagnosis not present

## 2016-06-23 DIAGNOSIS — R0989 Other specified symptoms and signs involving the circulatory and respiratory systems: Secondary | ICD-10-CM | POA: Diagnosis present

## 2016-06-23 DIAGNOSIS — E559 Vitamin D deficiency, unspecified: Secondary | ICD-10-CM | POA: Insufficient documentation

## 2016-06-23 DIAGNOSIS — Z7982 Long term (current) use of aspirin: Secondary | ICD-10-CM | POA: Insufficient documentation

## 2016-06-23 DIAGNOSIS — K625 Hemorrhage of anus and rectum: Secondary | ICD-10-CM | POA: Diagnosis not present

## 2016-06-23 DIAGNOSIS — I2510 Atherosclerotic heart disease of native coronary artery without angina pectoris: Secondary | ICD-10-CM | POA: Insufficient documentation

## 2016-06-23 DIAGNOSIS — M109 Gout, unspecified: Secondary | ICD-10-CM | POA: Diagnosis not present

## 2016-06-23 DIAGNOSIS — Z66 Do not resuscitate: Secondary | ICD-10-CM | POA: Diagnosis not present

## 2016-06-23 DIAGNOSIS — Z8041 Family history of malignant neoplasm of ovary: Secondary | ICD-10-CM | POA: Diagnosis not present

## 2016-06-23 DIAGNOSIS — I255 Ischemic cardiomyopathy: Secondary | ICD-10-CM | POA: Insufficient documentation

## 2016-06-23 DIAGNOSIS — R42 Dizziness and giddiness: Secondary | ICD-10-CM | POA: Diagnosis not present

## 2016-06-23 DIAGNOSIS — R531 Weakness: Secondary | ICD-10-CM | POA: Diagnosis not present

## 2016-06-23 DIAGNOSIS — Z8673 Personal history of transient ischemic attack (TIA), and cerebral infarction without residual deficits: Secondary | ICD-10-CM | POA: Diagnosis not present

## 2016-06-23 DIAGNOSIS — M353 Polymyalgia rheumatica: Secondary | ICD-10-CM | POA: Diagnosis not present

## 2016-06-23 DIAGNOSIS — N183 Chronic kidney disease, stage 3 (moderate): Secondary | ICD-10-CM | POA: Insufficient documentation

## 2016-06-23 DIAGNOSIS — R55 Syncope and collapse: Secondary | ICD-10-CM | POA: Diagnosis present

## 2016-06-23 DIAGNOSIS — R262 Difficulty in walking, not elsewhere classified: Secondary | ICD-10-CM

## 2016-06-23 DIAGNOSIS — E785 Hyperlipidemia, unspecified: Secondary | ICD-10-CM | POA: Diagnosis not present

## 2016-06-23 DIAGNOSIS — Z888 Allergy status to other drugs, medicaments and biological substances status: Secondary | ICD-10-CM | POA: Insufficient documentation

## 2016-06-23 DIAGNOSIS — I951 Orthostatic hypotension: Secondary | ICD-10-CM | POA: Diagnosis not present

## 2016-06-23 DIAGNOSIS — K219 Gastro-esophageal reflux disease without esophagitis: Secondary | ICD-10-CM | POA: Insufficient documentation

## 2016-06-23 DIAGNOSIS — I130 Hypertensive heart and chronic kidney disease with heart failure and stage 1 through stage 4 chronic kidney disease, or unspecified chronic kidney disease: Secondary | ICD-10-CM | POA: Diagnosis not present

## 2016-06-23 DIAGNOSIS — G20 Parkinson's disease: Secondary | ICD-10-CM | POA: Diagnosis not present

## 2016-06-23 DIAGNOSIS — Z8042 Family history of malignant neoplasm of prostate: Secondary | ICD-10-CM | POA: Insufficient documentation

## 2016-06-23 DIAGNOSIS — I447 Left bundle-branch block, unspecified: Secondary | ICD-10-CM | POA: Insufficient documentation

## 2016-06-23 DIAGNOSIS — D631 Anemia in chronic kidney disease: Secondary | ICD-10-CM | POA: Diagnosis not present

## 2016-06-23 LAB — BASIC METABOLIC PANEL
Anion gap: 13 (ref 5–15)
BUN: 66 mg/dL — AB (ref 6–20)
CALCIUM: 9.5 mg/dL (ref 8.9–10.3)
CO2: 23 mmol/L (ref 22–32)
CREATININE: 1.88 mg/dL — AB (ref 0.61–1.24)
Chloride: 105 mmol/L (ref 101–111)
GFR, EST AFRICAN AMERICAN: 35 mL/min — AB (ref >60)
GFR, EST NON AFRICAN AMERICAN: 30 mL/min — AB (ref >60)
Glucose, Bld: 148 mg/dL — ABNORMAL HIGH (ref 65–99)
Potassium: 3.9 mmol/L (ref 3.5–5.1)
SODIUM: 141 mmol/L (ref 135–145)

## 2016-06-23 LAB — CBC
HCT: 34.7 % — ABNORMAL LOW (ref 40.0–52.0)
Hemoglobin: 11.7 g/dL — ABNORMAL LOW (ref 13.0–18.0)
MCH: 32.8 pg (ref 26.0–34.0)
MCHC: 33.8 g/dL (ref 32.0–36.0)
MCV: 97 fL (ref 80.0–100.0)
PLATELETS: 149 10*3/uL — AB (ref 150–440)
RBC: 3.58 MIL/uL — AB (ref 4.40–5.90)
RDW: 15.5 % — AB (ref 11.5–14.5)
WBC: 10.5 10*3/uL (ref 3.8–10.6)

## 2016-06-23 LAB — TROPONIN I: TROPONIN I: 0.03 ng/mL — AB (ref <0.03)

## 2016-06-23 NOTE — ED Triage Notes (Addendum)
Pt from home via [facility]. Patient's wife reports finding him in bathroom slumped over his knees. States that she had to stimulate him to wake him up. EMS reports upon their arrival pt was pale and diaphoretic and very lethargic. Patient reports that he started feeling dizzy today and "just bad". His BP this morning was 99/65 at home. At this time patient is diaphoretic and reports nausea. Denies any vomiting or vision changes. Denies pain at this time. Denies any recent illness. EMS [facility] 115.

## 2016-06-23 NOTE — ED Provider Notes (Signed)
[facility] Emergency Department Provider Note   ____________________________________________    I have reviewed the triage vital signs and the nursing notes.   HISTORY  Chief Complaint Loss of Consciousness     HPI Keith Woods is a 82 y.o. male who presents with complaints of near syncopal versus syncopal episode. Patient does not have a good memory of the episode. His wife reports that she noticed he was in the bathroom for some time and she went to check on him and found him slumped over. EMS was called and he was found to be hypotensive. Prior to this he had been feeling weak and dizzy for most of the day. He does not believe that he has had any medication changes. He denies chest pain. He denies palpitations. He denies neuro deficits. He denies headache. Denies abdominal pain. He reports normal stools   Past Medical History:  Diagnosis Date  . Coronary artery disease    NONOBSTRUCTIVE by cath 2008  . GERD (gastroesophageal reflux disease)   . History of prostate cancer   . History of transient ischemic attack (TIA)   . Hyperlipidemia   . Hypertension   . LBBB (left bundle branch block)   . Syncope and collapse     Patient Active Problem List   Diagnosis Date Noted  . Cardiomyopathy, ischemic 05/08/2016  . Severe left ventricular systolic dysfunction 123456  . Weakness of both legs 03/30/2016  . History of CVA (cerebrovascular accident) 03/08/2016  . Frequent falls 02/15/2016  . Parkinson's disease ([place]) 02/15/2016  . PMR (polymyalgia rheumatica) (HCC) 12/13/2015  . Cerebral microvascular disease 12/13/2015  . Vitamin D deficiency 10/31/2015  . Anemia 10/31/2015  . Syncope 10/15/2015  . Gout 08/15/2015  . Fatigue 07/08/2015  . Right leg pain 05/18/2015  . Acute gout 05/18/2015  . Wrist pain 03/15/2015  . Chronic kidney disease 02/02/2015  . Vertigo 02/02/2015  . Hearing loss 02/02/2015  . H/O malignant neoplasm of  prostate 12/06/2012  . LBBB (left bundle branch block)   . Hypertension   . Coronary artery disease   . Hyperlipidemia     Past Surgical History:  Procedure Laterality Date  . CARDIAC CATHETERIZATION  08/28/2006   EF 60%  . TONSILLECTOMY    . TRANSTHORACIC ECHOCARDIOGRAM  10/10/2008   EF 55%    Prior to Admission medications   Medication Sig Start Date End Date Taking? Authorizing Provider  allopurinol (ZYLOPRIM) 100 MG tablet Take 2 tablets (200 mg total) by mouth daily. 11/29/15   [name], MD  aspirin EC 325 MG EC tablet Take 1 tablet (325 mg total) by mouth daily. Hold off till Wednesday 03/12/16 and resume it on 03/13/2016. 03/13/16   [name], MD  b complex vitamins tablet Take 1 tablet by mouth daily.    Historical Provider, MD  Carbidopa-Levodopa ER (SINEMET CR) 25-100 MG tablet controlled release Take 1 tablet by mouth 2 (two) times daily. 03/10/16   [name], MD  citalopram (CELEXA) 10 MG tablet Take 0.5 tablets (5 mg total) by mouth 2 (two) times daily. 02/19/16   [name], MD  Colchicine (MITIGARE) 0.6 MG CAPS Two tablets today, then 1 tablet daily 07/20/15   [name], MD  esomeprazole (NEXIUM) 40 MG capsule TAKE 1 CAPSULE DAILY 12/18/15   [name], MD  fish oil-omega-3 fatty acids 1000 MG capsule Take 1,200 mg by mouth daily.     Historical Provider, MD  Icosapent Ethyl (  VASCEPA) 1 g CAPS Take 1 tablet by mouth daily. 03/10/16   [name], MD  metoprolol succinate (TOPROL XL) 25 MG 24 hr tablet Take 1 tablet (25 mg total) by mouth daily. Take 1/2 tablet daily 05/08/16   [name], MD  Multiple Vitamin (MULTIVITAMIN) tablet Take 1 tablet by mouth daily.      Historical Provider, MD  nitroGLYCERIN (NITROSTAT) 0.4 MG SL tablet Place 1 tablet (0.4 mg total) under the tongue every 5 (five) minutes as needed for chest pain. 04/23/11   [name], MD  predniSONE (DELTASONE) 5 MG tablet Take 4 tablets daily for 4 days, then 3 daily for 4 days, then 2 daily  for 4 days, then 1 daily thereafter 06/05/16   [name], MD  Probiotic Product (ALIGN) 4 MG CAPS Take 4 mg by mouth daily.    Historical Provider, MD  rosuvastatin (CRESTOR) 10 MG tablet Take 1 tablet (10 mg total) by mouth daily. 05/22/16   [name], MD  sodium bicarbonate 650 MG tablet Take 650 mg by mouth 2 (two) times daily. 01/05/15   Historical Provider, MD  Vitamin D, Ergocalciferol, (DRISDOL) 50000 units CAPS capsule TAKE 1 CAPSULE ONCE A WEEK 10/08/15   [name], MD  [facility] 625 MG tablet TAKE 3 TABLETS TWICE A DAY 09/03/15   [name], MD     Allergies Lipitor [atorvastatin calcium]; Nsaids; and Statins  Family History  Problem Relation Age of Onset  . Ovarian cancer Mother   . Prostate cancer Father     Social History Social History  Substance Use Topics  . Smoking status: Never Smoker  . Smokeless tobacco: Never Used  . Alcohol use No    Review of Systems  Constitutional: No fever/chills Eyes: No visual changes.  ENT: NoNeck pain. Cardiovascular: Denies chest pain. Respiratory: Denies shortness of breath. Gastrointestinal: No abdominal pain.  No nausea, no vomiting.  All stools  Musculoskeletal: Negative for back pain. Skin: Negative for rash. Neurological: Negative for headaches or focal weakness  10-point ROS otherwise negative.  ____________________________________________   PHYSICAL EXAM:  VITAL SIGNS: ED Triage Vitals  Enc Vitals Group     BP 06/23/16 2245 (!) 142/89     Pulse Rate 06/23/16 2245 87     Resp 06/23/16 2245 16     Temp 06/23/16 2245 98.2 F (36.8 C)     Temp Source 06/23/16 2245 Oral     SpO2 06/23/16 2245 95 %     Weight 06/23/16 2246 181 lb (82.1 kg)     Height 06/23/16 2246 6\' 2"  (1.88 m)     Head Circumference --      Peak Flow --      Pain Score --      Pain Loc --      Pain Edu? --      Excl. in [place]? --     Constitutional: Alert and oriented. No acute distress. Pleasant and interactive Eyes:  Conjunctivae are normal.  Head: Atraumatic. Nose: No congestion/rhinnorhea. Mouth/Throat: Mucous membranes are moist.   Neck:  Painless ROM, No vertebral tenderness to palpation Cardiovascular: Normal rate, regular rhythm. Grossly normal heart sounds.  Good peripheral circulation. Respiratory: Normal respiratory effort.  No retractions. Lungs CTAB. Gastrointestinal: Soft and nontender. No distention.  No CVA tenderness. Genitourinary: deferred Musculoskeletal: No lower extremity tenderness nor edema.  Warm and well perfused Neurologic:  Normal speech and language. No gross focal neurologic deficits are appreciated.  Skin:  Skin is warm, dry and intact. No rash noted. Psychiatric: Mood and affect are normal. Speech and behavior are normal.  ____________________________________________   LABS (all labs ordered are listed, but only abnormal results are displayed)  Labs Reviewed  BASIC METABOLIC PANEL - Abnormal; Notable for the following:       Result Value   Glucose, Bld 148 (*)    BUN 66 (*)    Creatinine, Ser 1.88 (*)    GFR calc non Af Amer 30 (*)    GFR calc Af Amer 35 (*)    All other components within normal limits  CBC - Abnormal; Notable for the following:    RBC 3.58 (*)    Hemoglobin 11.7 (*)    HCT 34.7 (*)    RDW 15.5 (*)    Platelets 149 (*)    All other components within normal limits  TROPONIN I - Abnormal; Notable for the following:    Troponin I 0.03 (*)    All other components within normal limits  URINALYSIS COMPLETEWITH MICROSCOPIC ([facility] ONLY)   ____________________________________________  EKG  ED ECG REPORT I, Lavonia Drafts, the attending physician, personally viewed and interpreted this ECG.  Date: 06/23/2016 EKG Time: 10:24 PM Rate: 93 Rhythm: normal sinus rhythm QRS Axis: normal Intervals: normal ST/T Wave abnormalities: normal Conduction Disturbances: Left bundle branch block  old   ____________________________________________  RADIOLOGY  Chest x-ray unremarkable CT scan unremarkable ____________________________________________   PROCEDURES  Procedure(s) performed: No    Critical Care performed:No ____________________________________________   INITIAL IMPRESSION / ASSESSMENT AND PLAN / ED COURSE  Pertinent labs & imaging results that were available during my care of the patient were reviewed by me and considered in my medical decision making (see chart for details).  Patient with history of cardiomyopathy,prior CVA, chronic kidney disease presents with likely syncopal episode. He was hypertensive at the scene per EMS. Lab work is overall reassuring although troponin is noted to be borderline.  Clinical Course   We will obtain CT head and chest x-ray  Given multiple comorbidities and unexplained syncope, we'll admit for observation ____________________________________________   FINAL CLINICAL IMPRESSION(S) / ED DIAGNOSES  Final diagnoses:  Syncope and collapse      NEW MEDICATIONS STARTED DURING THIS VISIT:  New Prescriptions   No medications on file     Note:  This document was prepared using Dragon voice recognition software and may include unintentional dictation errors.    Lavonia Drafts, MD 06/24/16 [id number]

## 2016-06-23 NOTE — ED Notes (Signed)
Patient transported to CT 

## 2016-06-24 ENCOUNTER — Emergency Department: Payer: Medicare Other

## 2016-06-24 ENCOUNTER — Observation Stay (HOSPITAL_BASED_OUTPATIENT_CLINIC_OR_DEPARTMENT_OTHER)
Admit: 2016-06-24 | Discharge: 2016-06-24 | Disposition: A | Discharge status: Home / Self Care | Payer: Medicare Other | Attending: Internal Medicine | Admitting: Internal Medicine

## 2016-06-24 DIAGNOSIS — I429 Cardiomyopathy, unspecified: Secondary | ICD-10-CM | POA: Diagnosis not present

## 2016-06-24 DIAGNOSIS — I10 Essential (primary) hypertension: Secondary | ICD-10-CM | POA: Diagnosis not present

## 2016-06-24 DIAGNOSIS — R42 Dizziness and giddiness: Secondary | ICD-10-CM | POA: Diagnosis not present

## 2016-06-24 DIAGNOSIS — R55 Syncope and collapse: Secondary | ICD-10-CM

## 2016-06-24 DIAGNOSIS — I255 Ischemic cardiomyopathy: Secondary | ICD-10-CM

## 2016-06-24 DIAGNOSIS — I951 Orthostatic hypotension: Secondary | ICD-10-CM

## 2016-06-24 DIAGNOSIS — I2510 Atherosclerotic heart disease of native coronary artery without angina pectoris: Secondary | ICD-10-CM

## 2016-06-24 DIAGNOSIS — E785 Hyperlipidemia, unspecified: Secondary | ICD-10-CM | POA: Diagnosis not present

## 2016-06-24 LAB — BASIC METABOLIC PANEL
ANION GAP: 8 (ref 5–15)
BUN: 61 mg/dL — ABNORMAL HIGH (ref 6–20)
CALCIUM: 8.9 mg/dL (ref 8.9–10.3)
CO2: 24 mmol/L (ref 22–32)
Chloride: 109 mmol/L (ref 101–111)
Creatinine, Ser: 1.67 mg/dL — ABNORMAL HIGH (ref 0.61–1.24)
GFR, EST AFRICAN AMERICAN: 41 mL/min — AB (ref >60)
GFR, EST NON AFRICAN AMERICAN: 35 mL/min — AB (ref >60)
GLUCOSE: 120 mg/dL — AB (ref 65–99)
POTASSIUM: 4 mmol/L (ref 3.5–5.1)
Sodium: 141 mmol/L (ref 135–145)

## 2016-06-24 LAB — TROPONIN I
TROPONIN I: 0.05 ng/mL — AB (ref <0.03)
TROPONIN I: 0.04 ng/mL — AB (ref <0.03)
TROPONIN I: 0.03 ng/mL — AB (ref <0.03)

## 2016-06-24 LAB — CBC
HEMATOCRIT: 31 % — AB (ref 40.0–52.0)
HEMOGLOBIN: 10.5 g/dL — AB (ref 13.0–18.0)
MCH: 33.1 pg (ref 26.0–34.0)
MCHC: 34 g/dL (ref 32.0–36.0)
MCV: 97.3 fL (ref 80.0–100.0)
Platelets: 134 10*3/uL — ABNORMAL LOW (ref 150–440)
RBC: 3.19 MIL/uL — AB (ref 4.40–5.90)
RDW: 15.4 % — ABNORMAL HIGH (ref 11.5–14.5)
WBC: 9.2 10*3/uL (ref 3.8–10.6)

## 2016-06-24 LAB — ECHOCARDIOGRAM LIMITED
HEIGHTINCHES: 74 in
WEIGHTICAEL: 2896 [oz_av]

## 2016-06-24 LAB — GLUCOSE, CAPILLARY
GLUCOSE-CAPILLARY: 120 mg/dL — AB (ref 65–99)
Glucose-Capillary: 106 mg/dL — ABNORMAL HIGH (ref 65–99)

## 2016-06-24 MED ORDER — ONDANSETRON HCL 4 MG PO TABS: 4.0000 mg | ORAL_TABLET | Freq: Four times a day (QID) | ORAL | Status: DC | PRN

## 2016-06-24 MED ORDER — SENNOSIDES-DOCUSATE SODIUM 8.6-50 MG PO TABS: 1.0000 | ORAL_TABLET | Freq: Every evening | ORAL | Status: DC | PRN

## 2016-06-24 MED ORDER — COLESEVELAM HCL 625 MG PO TABS
1875.0000 mg | ORAL_TABLET | Freq: Two times a day (BID) | ORAL | Status: DC
  Administered 2016-06-26 – 2016-06-27 (×2): 1875 mg via ORAL
  Filled 2016-06-24 (×10): qty 3

## 2016-06-24 MED ORDER — RISAQUAD PO CAPS
1.0000 | ORAL_CAPSULE | Freq: Every day | ORAL | Status: DC
  Administered 2016-06-24 – 2016-06-28 (×5): 1 via ORAL
  Filled 2016-06-24 (×5): qty 1

## 2016-06-24 MED ORDER — PERFLUTREN LIPID MICROSPHERE
1.0000 mL | INTRAVENOUS | Status: AC | PRN
  Administered 2016-06-24: 2 mL via INTRAVENOUS
  Filled 2016-06-24: qty 10

## 2016-06-24 MED ORDER — SODIUM CHLORIDE 0.9% FLUSH: 3.0000 mL | INTRAVENOUS | Status: DC | PRN

## 2016-06-24 MED ORDER — ONDANSETRON HCL 4 MG/2ML IJ SOLN
4.0000 mg | Freq: Four times a day (QID) | INTRAMUSCULAR | Status: DC | PRN
  Filled 2016-06-24: qty 2

## 2016-06-24 MED ORDER — HEPARIN SODIUM (PORCINE) 5000 UNIT/ML IJ SOLN
5000.0000 [IU] | Freq: Three times a day (TID) | INTRAMUSCULAR | Status: DC
  Administered 2016-06-24 – 2016-06-28 (×12): 5000 [IU] via SUBCUTANEOUS
  Filled 2016-06-24 (×12): qty 1

## 2016-06-24 MED ORDER — SODIUM CHLORIDE 0.9 % IV BOLUS (SEPSIS)
250.0000 mL | Freq: Once | INTRAVENOUS | Status: AC
  Administered 2016-06-24: 250 mL via INTRAVENOUS

## 2016-06-24 MED ORDER — METOPROLOL SUCCINATE ER 25 MG PO TB24
12.5000 mg | ORAL_TABLET | Freq: Every day | ORAL | Status: DC
  Administered 2016-06-24 – 2016-06-25 (×2): 12.5 mg via ORAL
  Filled 2016-06-24 (×3): qty 1

## 2016-06-24 MED ORDER — ALLOPURINOL 100 MG PO TABS
200.0000 mg | ORAL_TABLET | Freq: Every day | ORAL | Status: DC
  Administered 2016-06-24 – 2016-06-28 (×5): 200 mg via ORAL
  Filled 2016-06-24 (×5): qty 2

## 2016-06-24 MED ORDER — OMEGA-3-ACID ETHYL ESTERS 1 G PO CAPS
1.0000 g | ORAL_CAPSULE | Freq: Every day | ORAL | Status: DC
  Administered 2016-06-24 – 2016-06-28 (×5): 1 g via ORAL
  Filled 2016-06-24 (×5): qty 1

## 2016-06-24 MED ORDER — CITALOPRAM HYDROBROMIDE 10 MG PO TABS
5.0000 mg | ORAL_TABLET | Freq: Two times a day (BID) | ORAL | Status: DC
  Administered 2016-06-24 – 2016-06-28 (×9): 5 mg via ORAL
  Filled 2016-06-24 (×11): qty 1

## 2016-06-24 MED ORDER — ACETAMINOPHEN 650 MG RE SUPP: 650.0000 mg | Freq: Four times a day (QID) | RECTAL | Status: DC | PRN

## 2016-06-24 MED ORDER — SODIUM BICARBONATE 650 MG PO TABS
650.0000 mg | ORAL_TABLET | Freq: Two times a day (BID) | ORAL | Status: DC
  Administered 2016-06-24 – 2016-06-28 (×10): 650 mg via ORAL
  Filled 2016-06-24 (×10): qty 1

## 2016-06-24 MED ORDER — ALIGN 4 MG PO CAPS: 4.0000 mg | ORAL_CAPSULE | Freq: Every day | ORAL | Status: DC

## 2016-06-24 MED ORDER — NITROGLYCERIN 0.4 MG SL SUBL: 0.4000 mg | SUBLINGUAL_TABLET | SUBLINGUAL | Status: DC | PRN

## 2016-06-24 MED ORDER — ASPIRIN EC 81 MG PO TBEC
81.0000 mg | DELAYED_RELEASE_TABLET | Freq: Every day | ORAL | Status: DC
  Administered 2016-06-24 – 2016-06-28 (×5): 81 mg via ORAL
  Filled 2016-06-24 (×5): qty 1

## 2016-06-24 MED ORDER — B COMPLEX PO TABS: 1.0000 | ORAL_TABLET | Freq: Every day | ORAL | Status: DC

## 2016-06-24 MED ORDER — ICOSAPENT ETHYL 1 G PO CAPS: 1.0000 | ORAL_CAPSULE | Freq: Every day | ORAL | Status: DC

## 2016-06-24 MED ORDER — ROSUVASTATIN CALCIUM 10 MG PO TABS
10.0000 mg | ORAL_TABLET | Freq: Every day | ORAL | Status: DC
  Administered 2016-06-24 – 2016-06-28 (×5): 10 mg via ORAL
  Filled 2016-06-24 (×5): qty 1

## 2016-06-24 MED ORDER — ACETAMINOPHEN 325 MG PO TABS: 650.0000 mg | ORAL_TABLET | Freq: Four times a day (QID) | ORAL | Status: DC | PRN

## 2016-06-24 MED ORDER — SODIUM CHLORIDE 0.9 % IV SOLN: 250.0000 mL | INTRAVENOUS | Status: DC | PRN

## 2016-06-24 MED ORDER — ADULT MULTIVITAMIN W/MINERALS CH
1.0000 | ORAL_TABLET | Freq: Every day | ORAL | Status: DC
  Administered 2016-06-24 – 2016-06-28 (×5): 1 via ORAL
  Filled 2016-06-24 (×5): qty 1

## 2016-06-24 MED ORDER — SODIUM CHLORIDE 0.9% FLUSH
3.0000 mL | Freq: Two times a day (BID) | INTRAVENOUS | Status: DC
  Administered 2016-06-24 – 2016-06-27 (×3): 3 mL via INTRAVENOUS

## 2016-06-24 MED ORDER — ASPIRIN EC 325 MG PO TBEC: 325.0000 mg | DELAYED_RELEASE_TABLET | Freq: Every day | ORAL | Status: DC

## 2016-06-24 MED ORDER — METOPROLOL SUCCINATE ER 25 MG PO TB24
25.0000 mg | ORAL_TABLET | Freq: Every day | ORAL | Status: DC
Start: 2016-06-24 — End: 2016-06-24

## 2016-06-24 MED ORDER — VITAMIN D (ERGOCALCIFEROL) 1.25 MG (50000 UNIT) PO CAPS
50000.0000 [IU] | ORAL_CAPSULE | ORAL | Status: DC
  Administered 2016-06-24: 50000 [IU] via ORAL
  Filled 2016-06-24: qty 1

## 2016-06-24 MED ORDER — CARBIDOPA-LEVODOPA ER 25-100 MG PO TBCR
1.0000 | EXTENDED_RELEASE_TABLET | Freq: Two times a day (BID) | ORAL | Status: DC
  Administered 2016-06-24 – 2016-06-28 (×9): 1 via ORAL
  Filled 2016-06-24 (×11): qty 1

## 2016-06-24 MED ORDER — OMEGA-3 FATTY ACIDS 1000 MG PO CAPS: 1200.0000 mg | ORAL_CAPSULE | Freq: Every day | ORAL | Status: DC

## 2016-06-24 MED ORDER — SODIUM CHLORIDE 0.9% FLUSH
3.0000 mL | Freq: Two times a day (BID) | INTRAVENOUS | Status: DC
  Administered 2016-06-24 – 2016-06-28 (×8): 3 mL via INTRAVENOUS

## 2016-06-24 MED ORDER — B COMPLEX-C PO TABS
1.0000 | ORAL_TABLET | Freq: Every day | ORAL | Status: DC
  Administered 2016-06-24 – 2016-06-28 (×5): 1 via ORAL
  Filled 2016-06-24 (×5): qty 1

## 2016-06-24 MED ORDER — PANTOPRAZOLE SODIUM 40 MG PO TBEC
40.0000 mg | DELAYED_RELEASE_TABLET | Freq: Every day | ORAL | Status: DC
  Administered 2016-06-24 – 2016-06-28 (×5): 40 mg via ORAL
  Filled 2016-06-24 (×5): qty 1

## 2016-06-24 NOTE — ED Notes (Signed)
Patient stood and attempted to void in urinal but was unable to produce a stream.

## 2016-06-24 NOTE — Care Management Obs Status (Signed)
[place] NOTIFICATION   Patient Details  Name: Keith Woods MRN: [id number] Date of Birth: Jan 14, 1926   Medicare Observation Status Notification Given:  Yes    Katrina Stack, RN 06/24/2016, 8:48 AM

## 2016-06-24 NOTE — Progress Notes (Signed)
  Echocardiogram 2D Echocardiogram has been performed.  Jennette Dubin 06/24/2016, 3:47 PM

## 2016-06-24 NOTE — Progress Notes (Signed)
[place] [facility] [place] NAME: Keith Woods    MR#:  [id number]  DATE OF BIRTH:  May 17, 1926  SUBJECTIVE:  CHIEF COMPLAINT:  Patient reports feeling dizzy when he stands up but denies any chest pain or shortness of breath. Denies any vertigo  REVIEW OF SYSTEMS:  CONSTITUTIONAL: No fever, fatigue or weakness.  EYES: No blurred or double vision.  EARS, NOSE, AND THROAT: No tinnitus or ear pain.  RESPIRATORY: No cough, shortness of breath, wheezing or hemoptysis.  CARDIOVASCULAR: No chest pain, orthopnea, edema.  GASTROINTESTINAL: No nausea, vomiting, diarrhea or abdominal pain.  GENITOURINARY: No dysuria, hematuria.  ENDOCRINE: No polyuria, nocturia,  HEMATOLOGY: No anemia, easy bruising or bleeding SKIN: No rash or lesion. MUSCULOSKELETAL: No joint pain or arthritis.   NEUROLOGIC: No tingling, numbness, weakness.  PSYCHIATRY: No anxiety or depression.   DRUG ALLERGIES:   Allergies  Allergen Reactions  . Lipitor [Atorvastatin Calcium]   . Nsaids   . Statins     VITALS:  Blood pressure (!) 113/94, pulse 93, temperature 97.7 F (36.5 C), temperature source Oral, resp. rate 18, height 6\' 2"  (1.88 m), weight 82.1 kg (181 lb), SpO2 99 %.  PHYSICAL EXAMINATION:  GENERAL:  82 y.o.-year-old patient lying in the bed with no acute distress.  EYES: Pupils equal, round, reactive to light and accommodation. No scleral icterus. Extraocular muscles intact.  HEENT: Head atraumatic, normocephalic. Oropharynx and nasopharynx clear.  NECK:  Supple, no jugular venous distention. No thyroid enlargement, no tenderness.  LUNGS: Normal breath sounds bilaterally, no wheezing, rales,rhonchi or crepitation. No use of accessory muscles of respiration.  CARDIOVASCULAR: S1, S2 normal. No murmurs, rubs, or gallops.  ABDOMEN: Soft, nontender, nondistended. Bowel sounds present. No organomegaly or mass.  EXTREMITIES: No pedal edema, cyanosis, or clubbing.   NEUROLOGIC: Cranial nerves II through XII are intact. Muscle strength 5/5 in all extremities. Sensation intact. Gait not checked.  PSYCHIATRIC: The patient is alert and oriented x 3.  SKIN: No obvious rash, lesion, or ulcer.    LABORATORY PANEL:   CBC  Recent Labs Lab 06/24/16 0531  WBC 9.2  HGB 10.5*  HCT 31.0*  PLT 134*   ------------------------------------------------------------------------------------------------------------------  Chemistries   Recent Labs Lab 06/24/16 0531  NA 141  K 4.0  CL 109  CO2 24  GLUCOSE 120*  BUN 61*  CREATININE 1.67*  CALCIUM 8.9   ------------------------------------------------------------------------------------------------------------------  Cardiac Enzymes  Recent Labs Lab 06/24/16 1123  TROPONINI 0.04*   ------------------------------------------------------------------------------------------------------------------  RADIOLOGY:  Ct Head Wo Contrast  Result Date: 06/24/2016 CLINICAL DATA:  Patient found slumped over the knees.  Syncope. EXAM: CT HEAD WITHOUT CONTRAST TECHNIQUE: Contiguous axial images were obtained from the base of the skull through the vertex without intravenous contrast. COMPARISON:  06/23/2015 CT FINDINGS: BRAIN: There is mild sulcal and ventricular prominence consistent with superficial and central atrophy. No intraparenchymal hemorrhage, mass effect nor midline shift. There are patchy periventricular and subcortical white matter hypodensities consistent with chronic small vessel ischemic disease. No acute large vascular territory infarcts. No abnormal extra-axial fluid collections. Basal cisterns are patent. VASCULAR: Moderate calcific atherosclerosis of the carotid siphons. Mild ectasia of the right vertebral artery. Bilateral vertebral arteriosclerosis. SKULL: No skull fracture. No significant scalp soft tissue swelling. SINUSES/ORBITS: The mastoid air-cells are clear. The included paranasal sinuses are  well-aerated.The included ocular globes and orbital contents are non-suspicious. OTHER: None. IMPRESSION: Mild involutional changes the brain likely age related. Chronic small vessel ischemic disease of periventricular  white matter. No acute intracranial abnormality. Electronically Signed   By: Ashley Royalty M.D.   On: 06/24/2016 00:13   Dg Chest Portable 1 View  Result Date: 06/24/2016 CLINICAL DATA:  Patient found slumped in bathroom. Dizziness. Syncope. Initial encounter. EXAM: PORTABLE CHEST 1 VIEW COMPARISON:  Chest radiograph performed 10/15/2015 FINDINGS: The lungs are well-aerated. Peribronchial thickening is noted. There is no evidence of focal opacification, pleural effusion or pneumothorax. The cardiomediastinal silhouette is within normal limits. No acute osseous abnormalities are seen. IMPRESSION: Peribronchial thickening noted.  Lungs otherwise grossly clear. Electronically Signed   By: Garald Balding M.D.   On: 06/24/2016 00:21    EKG:   Orders placed or performed during the hospital encounter of 06/23/16  . ED EKG  . ED EKG  . EKG 12-Lead  . EKG 12-Lead    ASSESSMENT AND PLAN:    82 year old male patient with history of cardiomyopathy, hypertension, hyperlipidemia, gout, prostate cancer, panic CVA, left bundle branch block presented to the emergency room after he passed out. Admitting diagnosis 1. Syncope and collapse secondary to orthostatic hypotension/neurocardiogenic syncope Monitor patient on telemetry Gentle hydration with IV fluids   echocardiogram PT evaluation Carotid ultrasound in July 2017 showed no significant stenosis August 2017 Lexi scan Myoview has revealed ejection fraction of 26% Acute MI ruled out troponins 0.03, 0.05, 0.04   2.  chronic ischemic Cardiomyopathy/history of coronary artery disease Toprol-XL  Echocardiogram ordered  Appreciate cardiology recommendations  Watch for volume overload while hydrating the patient given the ejection fraction  was 26% according to the [place] aspirin, beta blocker and statin  3. Hypertension-continue beta blocker Toprol-XL  4. Hyperlipidemia-continue statin  5. History of thalamic CVA-continue aspirin and statin  6. Chronic kidney disease stage III-monitor renal function and avoid nephrotoxins  PT evaluation  All the records are reviewed and case discussed with Care Management/Social Workerr. Management plans discussed with the patient, family and they are in agreement.  CODE STATUS: Full code until patient discusses his CODE STATUS with his wife  TOTAL TIME TAKING CARE OF THIS PATIENT: 36 minutes.   POSSIBLE D/C IN 1-2 DAYS, DEPENDING ON CLINICAL CONDITION.  Note: This dictation was prepared with Dragon dictation along with smaller phrase technology. Any transcriptional errors that result from this process are unintentional.   Nicholes Mango M.D on 06/24/2016 at 3:53 PM  Between 7am to 6pm - Pager - [phone] After 6pm go to [email or web address] - password [email or web address] [facility]  Office  [phone]  CC: Primary care physician; [name], MD

## 2016-06-24 NOTE — Consult Note (Signed)
Cardiology Consultation Note  Patient ID: Keith Woods, MRN: [id number], DOB/AGE: 12-21-1925 82 y.o. Admit date: 06/23/2016   Date of Consult: 06/24/2016 Primary Physician: [name], MD Primary Cardiologist: [name], MD Requesting Physician: [name], MD  Chief Complaint: Syncope while having a BM Reason for Consult: Orthostatic hypotensions/neurocardiogenic syncope  HPI: 82 y.o. male with h/o CAD by nuclear stress test medically managed, previously with nonobstructive CAD by cardiac cath in 2008, ICM/chornic systolic CHF with EF of [id number] by Myoview and 25-30% by echo, orthostatic dizziness, labile HTN, HLD, GERD, and prostate cancer who presented to [facility] on 11/13 with orthostatic dizziness and a syncopal episode while straining to have a BM.   Patient was previously admitted in July 2017 with increased balance problems. He was found to have subacute thalamic CVA with hemorrhagic conversion at that time. Echo showed a new cardiomyopathy with an EF 25-30%. He was asymptoamtic. Medications were adjusted. Further ischemic evaluation was not pursued at that time given recent hemorrhagic CVA. He was not discharged on antihypertensives then given low BP. In August 2017 he did have a Lexiscan Myoview to evaluate for ischemia that demonstrated a large inferior infarct without ischemia with an EF of 26%. This suggested his cardiomyopathy was ischemic. He was initially started on low dose Coreg 3.125 mg daily with plans to titrate to bid if tolerated. Unfortunately, this was not tolerated and he was changed to Toprol XL 12.5 mg daily which he tolerated without issues. At follow up on 9/28 his Toprol XL was increased to 25 mg daily. After that change he noted positional dizziness and low SBP in the 90's mmHg.   He was sitting on the toilet on 11/13, straining to have a BM when he apparently suffered a syncope episode. He did not fall off the toilet or hit his head. He denied any preceding  chest pain, SOB, dizziness, nausea, vomiting, or diaphoresis. Episode was unwitnessed. Patient's wife found him with his head between his legs and called EMS.   Upon the patient's arrival to [facility] they were found to be significantly orthostatic with a sitting SBP of 115 and a standing SBP of 86. They were given small amount of IV fluids and cardiology consult was placed. Echo has been ordered. ECG showed NSR, 93 bpm, LBBB (known), CXR showed no acute process. CT head negative for acute process. No complaints this morning.    Past Medical History:  Diagnosis Date  . Coronary artery disease    NONOBSTRUCTIVE by cath 2008  . GERD (gastroesophageal reflux disease)   . History of prostate cancer   . History of transient ischemic attack (TIA)   . Hyperlipidemia   . Hypertension   . LBBB (left bundle branch block)   . Syncope and collapse       Most Recent Cardiac Studies: As above   Surgical History:  Past Surgical History:  Procedure Laterality Date  . CARDIAC CATHETERIZATION  08/28/2006   EF 60%  . TONSILLECTOMY    . TRANSTHORACIC ECHOCARDIOGRAM  10/10/2008   EF 55%     Home Meds: Prior to Admission medications   Medication Sig Start Date End Date Taking? Authorizing Provider  allopurinol (ZYLOPRIM) 100 MG tablet Take 2 tablets (200 mg total) by mouth daily. 11/29/15  Yes [name], MD  aspirin EC 325 MG EC tablet Take 1 tablet (325 mg total) by mouth daily. Hold off till Wednesday 03/12/16 and resume it on 03/13/2016. 03/13/16  Yes [name], MD  b complex vitamins tablet Take 1 tablet by mouth daily.   Yes Historical Provider, MD  Carbidopa-Levodopa ER (SINEMET CR) 25-100 MG tablet controlled release Take 1 tablet by mouth 2 (two) times daily. 03/10/16  Yes [name], MD  citalopram (CELEXA) 10 MG tablet Take 0.5 tablets (5 mg total) by mouth 2 (two) times daily. 02/19/16  Yes [name], MD  Colchicine (MITIGARE) 0.6 MG CAPS Two tablets today, then 1 tablet daily 07/20/15  Yes  [name], MD  esomeprazole (NEXIUM) 40 MG capsule TAKE 1 CAPSULE DAILY 12/18/15  Yes [name], MD  fish oil-omega-3 fatty acids 1000 MG capsule Take 1,200 mg by mouth daily.    Yes Historical Provider, MD  Icosapent Ethyl (VASCEPA) 1 g CAPS Take 1 tablet by mouth daily. 03/10/16  Yes [name], MD  metoprolol succinate (TOPROL XL) 25 MG 24 hr tablet Take 1 tablet (25 mg total) by mouth daily. Take 1/2 tablet daily 05/08/16  Yes [name], MD  Multiple Vitamin (MULTIVITAMIN) tablet Take 1 tablet by mouth daily.     Yes Historical Provider, MD  nitroGLYCERIN (NITROSTAT) 0.4 MG SL tablet Place 1 tablet (0.4 mg total) under the tongue every 5 (five) minutes as needed for chest pain. 04/23/11  Yes [name], MD  predniSONE (DELTASONE) 5 MG tablet Take 4 tablets daily for 4 days, then 3 daily for 4 days, then 2 daily for 4 days, then 1 daily thereafter 06/05/16  Yes [name], MD  Probiotic Product (ALIGN) 4 MG CAPS Take 4 mg by mouth daily.   Yes Historical Provider, MD  rosuvastatin (CRESTOR) 10 MG tablet Take 1 tablet (10 mg total) by mouth daily. 05/22/16  Yes [name], MD  sodium bicarbonate 650 MG tablet Take 650 mg by mouth 2 (two) times daily. 01/05/15  Yes Historical Provider, MD  Vitamin D, Ergocalciferol, (DRISDOL) 50000 units CAPS capsule TAKE 1 CAPSULE ONCE A WEEK 10/08/15  Yes [name], MD  [facility] 625 MG tablet TAKE 3 TABLETS TWICE A DAY 09/03/15  Yes [name], MD    Inpatient Medications:  . acidophilus  1 capsule Oral Daily  . allopurinol  200 mg Oral Daily  . aspirin  325 mg Oral Daily  . B-complex with vitamin C  1 tablet Oral Daily  . Carbidopa-Levodopa ER  1 tablet Oral BID  . citalopram  5 mg Oral BID  . colesevelam  1,875 mg Oral BID  . heparin  5,000 Units Subcutaneous Q8H  . metoprolol succinate  12.5 mg Oral Daily  . multivitamin with minerals  1 tablet Oral Daily  . omega-3 acid ethyl esters  1 g Oral Daily  . pantoprazole   40 mg Oral Daily  . rosuvastatin  10 mg Oral Daily  . sodium bicarbonate  650 mg Oral BID  . sodium chloride flush  3 mL Intravenous Q12H  . sodium chloride flush  3 mL Intravenous Q12H  . Vitamin D (Ergocalciferol)  50,000 Units Oral Q7 days     Allergies:  Allergies  Allergen Reactions  . Lipitor [Atorvastatin Calcium]   . Nsaids   . Statins     Social History   Social History  . Marital status: Married    Spouse name: N/A  . Number of children: 3  . Years of education: N/A   Occupational History  . minister-retired    Social History Main Topics  . Smoking status: Never Smoker  .  Smokeless tobacco: Never Used  . Alcohol use No  . Drug use: No  . Sexual activity: Not on file   Other Topics Concern  . Not on file   Social History Narrative  . No narrative on file     Family History  Problem Relation Age of Onset  . Ovarian cancer Mother   . Prostate cancer Father      Review of Systems: Review of Systems  Constitutional: Positive for malaise/fatigue. Negative for chills, diaphoresis, fever and weight loss.  HENT: Negative for congestion.   Eyes: Negative for discharge and redness.  Respiratory: Negative for cough, hemoptysis, sputum production, shortness of breath and wheezing.   Cardiovascular: Negative for chest pain, palpitations, orthopnea, claudication, leg swelling and PND.  Gastrointestinal: Negative for abdominal pain, blood in stool, heartburn, melena, nausea and vomiting.  Genitourinary: Negative for hematuria.  Musculoskeletal: Positive for falls. Negative for myalgias.  Skin: Negative for rash.  Neurological: Positive for loss of consciousness and weakness. Negative for dizziness, tingling, tremors, sensory change, speech change and focal weakness.  Endo/Heme/Allergies: Does not bruise/bleed easily.  Psychiatric/Behavioral: Negative for substance abuse. The patient is not nervous/anxious.   All other systems reviewed and are  negative.   Labs:  Recent Labs  06/23/16 2236 06/24/16 0531  TROPONINI 0.03* 0.05*   Lab Results  Component Value Date   WBC 9.2 06/24/2016   HGB 10.5 (L) 06/24/2016   HCT 31.0 (L) 06/24/2016   MCV 97.3 06/24/2016   PLT 134 (L) 06/24/2016     Recent Labs Lab 06/24/16 0531  NA 141  K 4.0  CL 109  CO2 24  BUN 61*  CREATININE 1.67*  CALCIUM 8.9  GLUCOSE 120*   Lab Results  Component Value Date   CHOL 142 06/05/2016   HDL 46 06/05/2016   LDLCALC 72 06/05/2016   TRIG 118 06/05/2016   No results found for: DDIMER  Radiology/Studies:  Ct Head Wo Contrast  Result Date: 06/24/2016 CLINICAL DATA:  Patient found slumped over the knees.  Syncope. EXAM: CT HEAD WITHOUT CONTRAST TECHNIQUE: Contiguous axial images were obtained from the base of the skull through the vertex without intravenous contrast. COMPARISON:  06/23/2015 CT FINDINGS: BRAIN: There is mild sulcal and ventricular prominence consistent with superficial and central atrophy. No intraparenchymal hemorrhage, mass effect nor midline shift. There are patchy periventricular and subcortical white matter hypodensities consistent with chronic small vessel ischemic disease. No acute large vascular territory infarcts. No abnormal extra-axial fluid collections. Basal cisterns are patent. VASCULAR: Moderate calcific atherosclerosis of the carotid siphons. Mild ectasia of the right vertebral artery. Bilateral vertebral arteriosclerosis. SKULL: No skull fracture. No significant scalp soft tissue swelling. SINUSES/ORBITS: The mastoid air-cells are clear. The included paranasal sinuses are well-aerated.The included ocular globes and orbital contents are non-suspicious. OTHER: None. IMPRESSION: Mild involutional changes the brain likely age related. Chronic small vessel ischemic disease of periventricular white matter. No acute intracranial abnormality. Electronically Signed   By: Ashley Royalty M.D.   On: 06/24/2016 00:13   Dg Chest  Portable 1 View  Result Date: 06/24/2016 CLINICAL DATA:  Patient found slumped in bathroom. Dizziness. Syncope. Initial encounter. EXAM: PORTABLE CHEST 1 VIEW COMPARISON:  Chest radiograph performed 10/15/2015 FINDINGS: The lungs are well-aerated. Peribronchial thickening is noted. There is no evidence of focal opacification, pleural effusion or pneumothorax. The cardiomediastinal silhouette is within normal limits. No acute osseous abnormalities are seen. IMPRESSION: Peribronchial thickening noted.  Lungs otherwise grossly clear. Electronically Signed   By: Jacqulynn Cadet  Chang M.D.   On: 06/24/2016 00:21    EKG: Interpreted by me showed: NSR, 93 bpm, LBBB (known) Telemetry: Interpreted by me showed: NSR, 80's bpm, LBBB  Weights: Filed Weights   06/23/16 2246 06/24/16 0500  Weight: 181 lb (82.1 kg) 181 lb (82.1 kg)     Physical Exam: Blood pressure (!) 113/94, pulse 93, temperature 97.7 F (36.5 C), temperature source Oral, resp. rate 18, height 6\' 2"  (1.88 m), weight 181 lb (82.1 kg), SpO2 99 %. Body mass index is 23.24 kg/m. General: Well developed, well nourished, in no acute distress. Head: Normocephalic, atraumatic, sclera non-icteric, no xanthomas, nares are without discharge.  Neck: Negative for carotid bruits. JVD not elevated. Lungs: Clear bilaterally to auscultation without wheezes, rales, or rhonchi. Breathing is unlabored. Heart: RRR with S1 S2. No murmurs, rubs, or gallops appreciated. Abdomen: Soft, non-tender, non-distended with normoactive bowel sounds. No hepatomegaly. No rebound/guarding. No obvious abdominal masses. Msk:  Strength and tone appear normal for age. Extremities: No clubbing or cyanosis. No edema. Distal pedal pulses are 2+ and equal bilaterally. Neuro: Alert and oriented X 3. No facial asymmetry. No focal deficit. Moves all extremities spontaneously. Psych:  Responds to questions appropriately with a normal affect.    Assessment and Plan:  Principal  Problem:   Orthostatic hypotension Active Problems:   Syncope   Parkinson's disease ([place])   Labile hypertension   Coronary artery disease   History of CVA (cerebrovascular accident)   Cardiomyopathy, ischemic   LBBB (left bundle branch block)    1. Orthostatic hypotension/neurocardiogenic syncope: -Patient's orthostatic vitals signs are significantly positive with drop from [id number] systolic sitting to 86 systolic standing -Likely in the setting of orthostasis and straining to have a BM -Received 650 mL NS in the ED -Give another 250 mL of NS this morning -Decrease Toprol XL to 12.5 mg daily -Recheck orthostatic vital sings later this PM -Ambulate -Echo pending -Increase PO fluids at home, has been dehydrated recently  2. ICM/chronic systolic CHF: -No signs of volume overload at this time -Given IV hydration as above -Watch for volume overload given his cardiomyopathy -Decrease Toprol XL as above -Given his labile hypertension and issues with orthostasis unable to add further HF medications at this time -Could consider midodrine, defer to primary cardiologist  -CHF education  3. CAD: -Medically managed per HPI -No symptoms concerning for angina -Continue ASA, BB, statin  -No plans for inpatient ischemic evaluation   4. CKD stage III: -Stable  5. Anemia of chronic disease: -Stable  6. Minimally elevated troponin: -Likely in the setting of hypotension and CKD -No symptoms concerning for angina     Signed, Christell Faith, PA-C [facility] Pager: [phone] 06/24/2016, 8:34 AM

## 2016-06-24 NOTE — Progress Notes (Signed)
Pt. Arrived to floor via stretcher, transferred to bed via standby assist. Tele applied verified by Kristine Garbe, RN and Thayer Headings, CNA. Skin warm and dry with no issues, verified with Kristine Garbe, RN. Pt. A&O x4, with no c/o pain, SOB or acute distress noted. General room orientation given. Instruction on how to use call bell and ascom system given.

## 2016-06-24 NOTE — H&P (Addendum)
[facility] NAME: Keith Woods    MR#:  [id number]  DATE OF BIRTH:  09-10-1925  DATE OF ADMISSION:  06/23/2016  PRIMARY CARE PHYSICIAN: [name], MD   REQUESTING/REFERRING PHYSICIAN:   CHIEF COMPLAINT:   Chief Complaint  Patient presents with  . Loss of Consciousness    HISTORY OF PRESENT ILLNESS: Keith Woods  is a 82 y.o. male with a known history of cardiomyopathy with EF of 25%, GERD, prostate cancer, thalamic CVA, hypertension, hyperlipidemia, left bundle branch block presented to the emergency room after he passed out. Patient said he passed around 9:45 AM yesterday morning and was in the bathroom. It was witnessed by his wife. No complaints of any seizure activity. No history of any head injury. Patient had similar complaints in March 2017. He follows up with [facility] cardiology who recommended noninvasive management. Patient is completely awake and alert and responds to all verbal commands. No complaints of any chest pain, shortness of breath. No headache or blurry vision. Hospitalist service was consulted for further care of the patient. CT head shows no acute intracranial abnormality.  PAST MEDICAL HISTORY:   Past Medical History:  Diagnosis Date  . Coronary artery disease    NONOBSTRUCTIVE by cath 2008  . GERD (gastroesophageal reflux disease)   . History of prostate cancer   . History of transient ischemic attack (TIA)   . Hyperlipidemia   . Hypertension   . LBBB (left bundle branch block)   . Syncope and collapse     PAST SURGICAL HISTORY: Past Surgical History:  Procedure Laterality Date  . CARDIAC CATHETERIZATION  08/28/2006   EF 60%  . TONSILLECTOMY    . TRANSTHORACIC ECHOCARDIOGRAM  10/10/2008   EF 55%    SOCIAL HISTORY:  Social History  Substance Use Topics  . Smoking status: Never Smoker  . Smokeless tobacco: Never Used  . Alcohol use No    FAMILY HISTORY:  Family History   Problem Relation Age of Onset  . Ovarian cancer Mother   . Prostate cancer Father     DRUG ALLERGIES:  Allergies  Allergen Reactions  . Lipitor [Atorvastatin Calcium]   . Nsaids   . Statins     REVIEW OF SYSTEMS:   CONSTITUTIONAL: No fever, has generalized weakness.  EYES: No blurred or double vision.  EARS, NOSE, AND THROAT: No tinnitus or ear pain.  RESPIRATORY: No cough, shortness of breath, wheezing or hemoptysis.  CARDIOVASCULAR: No chest pain, orthopnea, edema.  GASTROINTESTINAL: No nausea, vomiting, diarrhea or abdominal pain.  GENITOURINARY: No dysuria, hematuria.  ENDOCRINE: No polyuria, nocturia,  HEMATOLOGY: No anemia, easy bruising or bleeding SKIN: No rash or lesion. MUSCULOSKELETAL: No joint pain or arthritis.   NEUROLOGIC: No tingling, numbness, weakness.  PSYCHIATRY: No anxiety or depression.   MEDICATIONS AT HOME:  Prior to Admission medications   Medication Sig Start Date End Date Taking? Authorizing Provider  allopurinol (ZYLOPRIM) 100 MG tablet Take 2 tablets (200 mg total) by mouth daily. 11/29/15  Yes [name], MD  aspirin EC 325 MG EC tablet Take 1 tablet (325 mg total) by mouth daily. Hold off till Wednesday 03/12/16 and resume it on 03/13/2016. 03/13/16  Yes [name], MD  b complex vitamins tablet Take 1 tablet by mouth daily.   Yes Historical Provider, MD  Carbidopa-Levodopa ER (SINEMET CR) 25-100 MG tablet controlled release Take 1 tablet by mouth 2 (two) times daily. 03/10/16  Yes Vipul  Manuella Ghazi, MD  citalopram (CELEXA) 10 MG tablet Take 0.5 tablets (5 mg total) by mouth 2 (two) times daily. 02/19/16  Yes [name], MD  Colchicine (MITIGARE) 0.6 MG CAPS Two tablets today, then 1 tablet daily 07/20/15  Yes [name], MD  esomeprazole (NEXIUM) 40 MG capsule TAKE 1 CAPSULE DAILY 12/18/15  Yes [name], MD  fish oil-omega-3 fatty acids 1000 MG capsule Take 1,200 mg by mouth daily.    Yes Historical Provider, MD  Icosapent Ethyl (VASCEPA) 1  g CAPS Take 1 tablet by mouth daily. 03/10/16  Yes [name], MD  metoprolol succinate (TOPROL XL) 25 MG 24 hr tablet Take 1 tablet (25 mg total) by mouth daily. Take 1/2 tablet daily 05/08/16  Yes [name], MD  Multiple Vitamin (MULTIVITAMIN) tablet Take 1 tablet by mouth daily.     Yes Historical Provider, MD  nitroGLYCERIN (NITROSTAT) 0.4 MG SL tablet Place 1 tablet (0.4 mg total) under the tongue every 5 (five) minutes as needed for chest pain. 04/23/11  Yes [name], MD  predniSONE (DELTASONE) 5 MG tablet Take 4 tablets daily for 4 days, then 3 daily for 4 days, then 2 daily for 4 days, then 1 daily thereafter 06/05/16  Yes [name], MD  Probiotic Product (ALIGN) 4 MG CAPS Take 4 mg by mouth daily.   Yes Historical Provider, MD  rosuvastatin (CRESTOR) 10 MG tablet Take 1 tablet (10 mg total) by mouth daily. 05/22/16  Yes [name], MD  sodium bicarbonate 650 MG tablet Take 650 mg by mouth 2 (two) times daily. 01/05/15  Yes Historical Provider, MD  Vitamin D, Ergocalciferol, (DRISDOL) 50000 units CAPS capsule TAKE 1 CAPSULE ONCE A WEEK 10/08/15  Yes [name], MD  [facility] 625 MG tablet TAKE 3 TABLETS TWICE A DAY 09/03/15  Yes [name], MD      PHYSICAL EXAMINATION:   VITAL SIGNS: Blood pressure 138/88, pulse 89, temperature 98.2 F (36.8 C), temperature source Oral, resp. rate (!) 21, height 6\' 2"  (1.88 m), weight 82.1 kg (181 lb), SpO2 95 %.  GENERAL:  82 y.o.-year-old patient lying in the bed with no acute distress.  EYES: Pupils equal, round, reactive to light and accommodation. No scleral icterus. Extraocular muscles intact.  HEENT: Head atraumatic, normocephalic. Oropharynx and nasopharynx clear.  NECK:  Supple, no jugular venous distention. No thyroid enlargement, no tenderness.  LUNGS: Normal breath sounds bilaterally, no wheezing, rales,rhonchi or crepitation. No use of accessory muscles of respiration.  CARDIOVASCULAR: S1, S2 normal. No murmurs,  rubs, or gallops.  ABDOMEN: Soft, nontender, nondistended. Bowel sounds present. No organomegaly or mass.  EXTREMITIES: No pedal edema, cyanosis, or clubbing.  NEUROLOGIC: Cranial nerves II through XII are intact. Muscle strength 5/5 in all extremities. Sensation intact. Gait not checked.  PSYCHIATRIC: The patient is alert and oriented x 3.  SKIN: No obvious rash, lesion, or ulcer.   LABORATORY PANEL:   CBC  Recent Labs Lab 06/23/16 2236  WBC 10.5  HGB 11.7*  HCT 34.7*  PLT 149*  MCV 97.0  MCH 32.8  MCHC 33.8  RDW 15.5*   ------------------------------------------------------------------------------------------------------------------  Chemistries   Recent Labs Lab 06/23/16 2236  NA 141  K 3.9  CL 105  CO2 23  GLUCOSE 148*  BUN 66*  CREATININE 1.88*  CALCIUM 9.5   ------------------------------------------------------------------------------------------------------------------ estimated creatinine clearance is 31.5 mL/min (by C-G formula based on SCr of 1.88 mg/dL (H)). ------------------------------------------------------------------------------------------------------------------ No results for  input(s): TSH, T4TOTAL, T3FREE, THYROIDAB in the last 72 hours.  Invalid input(s): FREET3   Coagulation profile No results for input(s): INR, PROTIME in the last 168 hours. ------------------------------------------------------------------------------------------------------------------- No results for input(s): DDIMER in the last 72 hours. -------------------------------------------------------------------------------------------------------------------  Cardiac Enzymes  Recent Labs Lab 06/23/16 2236  TROPONINI 0.03*   ------------------------------------------------------------------------------------------------------------------ Invalid input(s):  POCBNP  ---------------------------------------------------------------------------------------------------------------  Urinalysis    Component Value Date/Time   COLORURINE AMBER (A) 10/15/2015 1643   APPEARANCEUR CLOUDY (A) 10/15/2015 1643   LABSPEC 1.016 10/15/2015 1643   PHURINE 5.0 10/15/2015 1643   GLUCOSEU NEGATIVE 10/15/2015 1643   HGBUR NEGATIVE 10/15/2015 1643   BILIRUBINUR negative 02/15/2016 1113   KETONESUR NEGATIVE 10/15/2015 1643   PROTEINUR negative 02/15/2016 1113   PROTEINUR 30 (A) 10/15/2015 1643   UROBILINOGEN 0.2 02/15/2016 1113   UROBILINOGEN 0.2 10/09/2008 1850   NITRITE negative 02/15/2016 1113   NITRITE NEGATIVE 10/15/2015 1643   LEUKOCYTESUR Negative 02/15/2016 1113     RADIOLOGY: Ct Head Wo Contrast  Result Date: 06/24/2016 CLINICAL DATA:  Patient found slumped over the knees.  Syncope. EXAM: CT HEAD WITHOUT CONTRAST TECHNIQUE: Contiguous axial images were obtained from the base of the skull through the vertex without intravenous contrast. COMPARISON:  06/23/2015 CT FINDINGS: BRAIN: There is mild sulcal and ventricular prominence consistent with superficial and central atrophy. No intraparenchymal hemorrhage, mass effect nor midline shift. There are patchy periventricular and subcortical white matter hypodensities consistent with chronic small vessel ischemic disease. No acute large vascular territory infarcts. No abnormal extra-axial fluid collections. Basal cisterns are patent. VASCULAR: Moderate calcific atherosclerosis of the carotid siphons. Mild ectasia of the right vertebral artery. Bilateral vertebral arteriosclerosis. SKULL: No skull fracture. No significant scalp soft tissue swelling. SINUSES/ORBITS: The mastoid air-cells are clear. The included paranasal sinuses are well-aerated.The included ocular globes and orbital contents are non-suspicious. OTHER: None. IMPRESSION: Mild involutional changes the brain likely age related. Chronic small vessel  ischemic disease of periventricular white matter. No acute intracranial abnormality. Electronically Signed   By: Ashley Royalty M.D.   On: 06/24/2016 00:13   Dg Chest Portable 1 View  Result Date: 06/24/2016 CLINICAL DATA:  Patient found slumped in bathroom. Dizziness. Syncope. Initial encounter. EXAM: PORTABLE CHEST 1 VIEW COMPARISON:  Chest radiograph performed 10/15/2015 FINDINGS: The lungs are well-aerated. Peribronchial thickening is noted. There is no evidence of focal opacification, pleural effusion or pneumothorax. The cardiomediastinal silhouette is within normal limits. No acute osseous abnormalities are seen. IMPRESSION: Peribronchial thickening noted.  Lungs otherwise grossly clear. Electronically Signed   By: Garald Balding M.D.   On: 06/24/2016 00:21    EKG: Orders placed or performed during the hospital encounter of 06/23/16  . ED EKG  . ED EKG  . EKG 12-Lead  . EKG 12-Lead    IMPRESSION AND PLAN: 82 year old male patient with history of cardiomyopathy, hypertension, hyperlipidemia, gout, prostate cancer, panic CVA, left bundle branch block presented to the emergency room after he passed out. Admitting diagnosis 1. Syncope and collapse 2. Cardiomyopathy 3. Hypertension 4. Hyperlipidemia 5. History of thalamic CVA Treatment plan : Admit patient to telemetry observation bed Cycle troponin to rule out ischemia Resume cardiac medications Cardiology consultation Check echocardiogram Carotid ultrasound in July 2017 shows no obstruction DVT prophylaxis subcutaneous heparin.  All the records are reviewed and case discussed with ED provider. Management plans discussed with the patient, family and they are in agreement.  CODE STATUS:FULL CODE Surrogate Decision maker : Wife Code Status History    Date Active  Date Inactive Code Status Order ID Comments User Context   03/10/2016  8:37 AM 03/10/2016  2:45 PM DNR [id number]  [name], MD Inpatient   03/08/2016  4:02 PM 03/08/2016   4:21 PM Full Code [id number]  Nicholes Mango, MD Inpatient    Questions for Most Recent Historical Code Status (Order [id number])    Question Answer Comment   In the event of cardiac or respiratory ARREST Do not call a "code blue"    In the event of cardiac or respiratory ARREST Do not perform Intubation, CPR, defibrillation or ACLS    In the event of cardiac or respiratory ARREST Use medication by any route, position, wound care, and other measures to relive pain and suffering. May use oxygen, suction and manual treatment of airway obstruction as needed for comfort.        TOTAL TIME TAKING CARE OF THIS PATIENT: 54 minutes.    Saundra Shelling M.D on 06/24/2016 at 2:48 AM  Between 7am to 6pm - Pager - [phone]  After 6pm go to [email or web address] - password [email or web address] [facility]  Office  [phone]  CC: Primary care physician; [name], MD

## 2016-06-25 ENCOUNTER — Telehealth: Payer: Self-pay | Admitting: Family Medicine

## 2016-06-25 DIAGNOSIS — R55 Syncope and collapse: Secondary | ICD-10-CM | POA: Diagnosis not present

## 2016-06-25 DIAGNOSIS — I429 Cardiomyopathy, unspecified: Secondary | ICD-10-CM | POA: Diagnosis not present

## 2016-06-25 DIAGNOSIS — I2510 Atherosclerotic heart disease of native coronary artery without angina pectoris: Secondary | ICD-10-CM | POA: Diagnosis not present

## 2016-06-25 DIAGNOSIS — E785 Hyperlipidemia, unspecified: Secondary | ICD-10-CM | POA: Diagnosis not present

## 2016-06-25 DIAGNOSIS — I951 Orthostatic hypotension: Secondary | ICD-10-CM | POA: Diagnosis not present

## 2016-06-25 DIAGNOSIS — I255 Ischemic cardiomyopathy: Secondary | ICD-10-CM | POA: Diagnosis not present

## 2016-06-25 DIAGNOSIS — I10 Essential (primary) hypertension: Secondary | ICD-10-CM | POA: Diagnosis not present

## 2016-06-25 LAB — URINALYSIS COMPLETE WITH MICROSCOPIC (ARMC ONLY)
BACTERIA UA: NONE SEEN
Bilirubin Urine: NEGATIVE
GLUCOSE, UA: NEGATIVE mg/dL
Hgb urine dipstick: NEGATIVE
Ketones, ur: NEGATIVE mg/dL
Leukocytes, UA: NEGATIVE
Nitrite: NEGATIVE
Protein, ur: NEGATIVE mg/dL
Specific Gravity, Urine: 1.014 (ref 1.005–1.030)
Squamous Epithelial / LPF: NONE SEEN
pH: 5 (ref 5.0–8.0)

## 2016-06-25 LAB — CBC
HEMATOCRIT: 30.4 % — AB (ref 40.0–52.0)
HEMOGLOBIN: 10 g/dL — AB (ref 13.0–18.0)
MCH: 32.3 pg (ref 26.0–34.0)
MCHC: 33.1 g/dL (ref 32.0–36.0)
MCV: 97.7 fL (ref 80.0–100.0)
Platelets: 134 10*3/uL — ABNORMAL LOW (ref 150–440)
RBC: 3.11 MIL/uL — AB (ref 4.40–5.90)
RDW: 15.7 % — ABNORMAL HIGH (ref 11.5–14.5)
WBC: 6.7 10*3/uL (ref 3.8–10.6)

## 2016-06-25 LAB — MAGNESIUM: Magnesium: 1.8 mg/dL (ref 1.7–2.4)

## 2016-06-25 LAB — BASIC METABOLIC PANEL
ANION GAP: 6 (ref 5–15)
BUN: 52 mg/dL — ABNORMAL HIGH (ref 6–20)
CHLORIDE: 108 mmol/L (ref 101–111)
CO2: 27 mmol/L (ref 22–32)
Calcium: 9.3 mg/dL (ref 8.9–10.3)
Creatinine, Ser: 1.81 mg/dL — ABNORMAL HIGH (ref 0.61–1.24)
GFR calc non Af Amer: 32 mL/min — ABNORMAL LOW (ref >60)
GFR, EST AFRICAN AMERICAN: 37 mL/min — AB (ref >60)
GLUCOSE: 98 mg/dL (ref 65–99)
POTASSIUM: 3.9 mmol/L (ref 3.5–5.1)
Sodium: 141 mmol/L (ref 135–145)

## 2016-06-25 LAB — GLUCOSE, CAPILLARY: Glucose-Capillary: 85 mg/dL (ref 65–99)

## 2016-06-25 MED ORDER — SODIUM CHLORIDE 0.9 % IV SOLN
INTRAVENOUS | Status: AC
  Administered 2016-06-25: 14:00:00 via INTRAVENOUS

## 2016-06-25 NOTE — Evaluation (Signed)
Physical Therapy Evaluation Patient Details Name: Keith Woods MRN: [id number] DOB: [birth year] Today's Date: 06/25/2016   History of Present Illness  Pt is a 82 y/o M who presented after passing out earlier in the day.  CT head shows no acute abnormality.  Pt was found to be significantly orthostatic in the ED.  Pt's PMH includes LBBB, syncope, TIA, prostate cancer.   Clinical Impression  Pt admitted with above diagnosis. Pt currently with functional limitations due to the deficits listed below (see PT Problem List). [name] presents with strength WNL BUE and LE.  He has been +orthostatic since admission and remains orthostatic this session: BP supine 113/60, seated EOB 107/61, standing at bedside 74/51.  Therefore, PT session was limited but pt did tolerate therapeutic exercise while seated EOB.  Anticipate that once pt's BP is well controlled he will return to PLOF with decreased risk of falling. Pt will benefit from skilled PT to increase their independence and safety with mobility to allow discharge to the venue listed below.      Follow Up Recommendations No PT follow up    Equipment Recommendations  None recommended by PT    Recommendations for Other Services       Precautions / Restrictions Precautions Precautions: Fall;Other (comment) Precaution Comments: +orthostatic Restrictions Weight Bearing Restrictions: No      Mobility  Bed Mobility Overal bed mobility: Modified Independent             General bed mobility comments: No assist or cues needed.  Pt requires slightly increased time.  Transfers Overall transfer level: Needs assistance Equipment used: None Transfers: Sit to/from Stand Sit to Stand: Min guard         General transfer comment: Min guard for safety due to pt's +orthostatic readings this admission.  Pt +orthostatic this sessoin when standing as well with pt reporting mild dizziness upon standing.  He does not require assist to  sit.  Ambulation/Gait             General Gait Details: unable to assess due to +orthostatic  Stairs            Wheelchair Mobility    Modified Rankin (Stroke Patients Only)       Balance Overall balance assessment: Needs assistance Sitting-balance support: No upper extremity supported;Feet supported Sitting balance-Leahy Scale: Normal     Standing balance support: No upper extremity supported;During functional activity Standing balance-Leahy Scale: Fair Standing balance comment: +orthostatic requiring min guard for safety                             Pertinent Vitals/Pain Pain Assessment: No/denies pain    Home Living Family/patient expects to be discharged to:: Private residence Living Arrangements: Spouse/significant other Available Help at Discharge: Family;Available 24 hours/day Type of Home: House Home Access: Stairs to enter   Entrance Stairs-Number of Steps: 2 (1 and 1) Home Layout: Two level;Able to live on main level with bedroom/bathroom Home Equipment: Gilford Rile - 2 wheels;Cane - single point;Shower seat;Grab bars - tub/shower;Grab bars - toilet      Prior Function Level of Independence: Independent with assistive device(s)         Comments: SPC which he has gradually decreased his use with.  Occasionally uses cane in home, always when out in community.  Has fallen 12 times since July which he attributes for balance.  Was receiving HHPT earlier in the year, woking on balance, pt  reports no significant improvement.     Hand Dominance   Dominant Hand: Right    Extremity/Trunk Assessment   Upper Extremity Assessment: Overall WFL for tasks assessed           Lower Extremity Assessment: Overall WFL for tasks assessed      Cervical / Trunk Assessment: Normal  Communication   Communication: No difficulties  Cognition Arousal/Alertness: Awake/alert Behavior During Therapy: WFL for tasks assessed/performed Overall Cognitive  Status: Within Functional Limits for tasks assessed                      General Comments General comments (skin integrity, edema, etc.): BP supine 113/60, seated EOB 107/61, standing at bedside 74/51    Exercises General Exercises - Upper Extremity Shoulder Flexion: AROM;Both;10 reps;Seated General Exercises - Lower Extremity Gluteal Sets: Strengthening;Both;10 reps;Supine Heel Slides: Strengthening;Both;10 reps;Supine Straight Leg Raises: Strengthening;Both;10 reps;Supine Hip Flexion/Marching: Both;10 reps;Seated;Strengthening Other Exercises Other Exercises: Manually resisted Bil knee flexion and extension through full range x10 each   Assessment/Plan    PT Assessment Patient needs continued PT services  PT Problem List Decreased activity tolerance;Decreased balance;Decreased knowledge of use of DME;Decreased safety awareness          PT Treatment Interventions DME instruction;Gait training;Stair training;Therapeutic activities;Therapeutic exercise;Balance training;Patient/family education    PT Goals (Current goals can be found in the Care Plan section)  Acute Rehab PT Goals Patient Stated Goal: to go home PT Goal Formulation: With patient/family Time For Goal Achievement: 07/09/16 Potential to Achieve Goals: Good    Frequency Min 2X/week   Barriers to discharge        Co-evaluation               End of Session Equipment Utilized During Treatment: Gait belt Activity Tolerance: Treatment limited secondary to medical complications (Comment) (+orthostatic) Patient left: in bed;with call bell/phone within reach;with bed alarm set;with family/visitor present;with SCD's reapplied Nurse Communication: Mobility status;Other (comment) (BP readings)    Functional Assessment Tool Used: Clinical Judgement Functional Limitation: Mobility: Walking and moving around Mobility: Walking and Moving Around Current Status [id number]): At least 20 percent but less than 40  percent impaired, limited or restricted Mobility: Walking and Moving Around Goal Status [phone]): At least 1 percent but less than 20 percent impaired, limited or restricted    Time: [id number] PT Time Calculation (min) (ACUTE ONLY): 27 min   Charges:   PT Evaluation $PT Eval Low Complexity: 1 Procedure PT Treatments $Therapeutic Exercise: 8-22 mins   PT G Codes:   PT G-Codes **NOT FOR INPATIENT CLASS** Functional Assessment Tool Used: Clinical Judgement Functional Limitation: Mobility: Walking and moving around Mobility: Walking and Moving Around Current Status [id number]): At least 20 percent but less than 40 percent impaired, limited or restricted Mobility: Walking and Moving Around Goal Status [phone]): At least 1 percent but less than 20 percent impaired, limited or restricted    Collie Siad PT, DPT 06/25/2016, 3:15 PM

## 2016-06-25 NOTE — Progress Notes (Addendum)
[facility] [place] NAME: Keith Woods    MR#:  [id number]  DATE OF BIRTH:  09-04-1925  SUBJECTIVE:  CHIEF COMPLAINT:  Patient reports feeling dizzy when he stands up,Patient is significantly orthostatic-145/73 with pulse 61 lying, 102/62 sitting and 92/56 with pulse 79 while standing but denies any chest pain or shortness of breath. Denies any vertigo  REVIEW OF SYSTEMS:  CONSTITUTIONAL: No fever, fatigue or weakness.  EYES: No blurred or double vision.  EARS, NOSE, AND THROAT: No tinnitus or ear pain.  RESPIRATORY: No cough, shortness of breath, wheezing or hemoptysis.  CARDIOVASCULAR: No chest pain, orthopnea, edema.  GASTROINTESTINAL: No nausea, vomiting, diarrhea or abdominal pain.  GENITOURINARY: No dysuria, hematuria.  ENDOCRINE: No polyuria, nocturia,  HEMATOLOGY: No anemia, easy bruising or bleeding SKIN: No rash or lesion. MUSCULOSKELETAL: No joint pain or arthritis.   NEUROLOGIC: No tingling, numbness, weakness.  PSYCHIATRY: No anxiety or depression.   DRUG ALLERGIES:   Allergies  Allergen Reactions  . Lipitor [Atorvastatin Calcium]   . Nsaids   . Statins     VITALS:  Blood pressure (!) 130/112, pulse 62, temperature 97.7 F (36.5 C), temperature source Oral, resp. rate 18, height 6\' 2"  (1.88 m), weight 79.4 kg (175 lb), SpO2 96 %.  PHYSICAL EXAMINATION:  GENERAL:  82 y.o.-year-old patient lying in the bed with no acute distress.  EYES: Pupils equal, round, reactive to light and accommodation. No scleral icterus. Extraocular muscles intact.  HEENT: Head atraumatic, normocephalic. Oropharynx and nasopharynx clear.  NECK:  Supple, no jugular venous distention. No thyroid enlargement, no tenderness.  LUNGS: Normal breath sounds bilaterally, no wheezing, rales,rhonchi or crepitation. No use of accessory muscles of respiration.  CARDIOVASCULAR: S1, S2 normal. No murmurs, rubs, or gallops.  ABDOMEN: Soft,  nontender, nondistended. Bowel sounds present. No organomegaly or mass.  EXTREMITIES: No pedal edema, cyanosis, or clubbing.  NEUROLOGIC: Cranial nerves II through XII are intact. Muscle strength 5/5 in all extremities. Sensation intact. Gait not checked.  PSYCHIATRIC: The patient is alert and oriented x 3.  SKIN: No obvious rash, lesion, or ulcer.    LABORATORY PANEL:   CBC  Recent Labs Lab 06/25/16 0815  WBC 6.7  HGB 10.0*  HCT 30.4*  PLT 134*   ------------------------------------------------------------------------------------------------------------------  Chemistries   Recent Labs Lab 06/25/16 0815  NA 141  K 3.9  CL 108  CO2 27  GLUCOSE 98  BUN 52*  CREATININE 1.81*  CALCIUM 9.3  MG 1.8   ------------------------------------------------------------------------------------------------------------------  Cardiac Enzymes  Recent Labs Lab 06/24/16 1729  TROPONINI 0.03*   ------------------------------------------------------------------------------------------------------------------  RADIOLOGY:  Ct Head Wo Contrast  Result Date: 06/24/2016 CLINICAL DATA:  Patient found slumped over the knees.  Syncope. EXAM: CT HEAD WITHOUT CONTRAST TECHNIQUE: Contiguous axial images were obtained from the base of the skull through the vertex without intravenous contrast. COMPARISON:  06/23/2015 CT FINDINGS: BRAIN: There is mild sulcal and ventricular prominence consistent with superficial and central atrophy. No intraparenchymal hemorrhage, mass effect nor midline shift. There are patchy periventricular and subcortical white matter hypodensities consistent with chronic small vessel ischemic disease. No acute large vascular territory infarcts. No abnormal extra-axial fluid collections. Basal cisterns are patent. VASCULAR: Moderate calcific atherosclerosis of the carotid siphons. Mild ectasia of the right vertebral artery. Bilateral vertebral arteriosclerosis. SKULL: No skull  fracture. No significant scalp soft tissue swelling. SINUSES/ORBITS: The mastoid air-cells are clear. The included paranasal sinuses are well-aerated.The included ocular globes and orbital contents are  non-suspicious. OTHER: None. IMPRESSION: Mild involutional changes the brain likely age related. Chronic small vessel ischemic disease of periventricular white matter. No acute intracranial abnormality. Electronically Signed   By: Ashley Royalty M.D.   On: 06/24/2016 00:13   Dg Chest Portable 1 View  Result Date: 06/24/2016 CLINICAL DATA:  Patient found slumped in bathroom. Dizziness. Syncope. Initial encounter. EXAM: PORTABLE CHEST 1 VIEW COMPARISON:  Chest radiograph performed 10/15/2015 FINDINGS: The lungs are well-aerated. Peribronchial thickening is noted. There is no evidence of focal opacification, pleural effusion or pneumothorax. The cardiomediastinal silhouette is within normal limits. No acute osseous abnormalities are seen. IMPRESSION: Peribronchial thickening noted.  Lungs otherwise grossly clear. Electronically Signed   By: Garald Balding M.D.   On: 06/24/2016 00:21    EKG:   Orders placed or performed during the hospital encounter of 06/23/16  . ED EKG  . ED EKG  . EKG 12-Lead  . EKG 12-Lead    ASSESSMENT AND PLAN:    82 year old male patient with history of cardiomyopathy, hypertension, hyperlipidemia, gout, prostate cancer, panic CVA, left bundle branch block presented to the emergency room after he passed out. Admitting diagnosis 1. Syncope and collapse secondary to orthostatic hypotension/neurocardiogenic syncope Monitor patient on telemetry Gentle hydration with IV fluids   echocardiogram-25-30% ejection fraction PT evaluation-no PT needs identified Carotid ultrasound in July 2017 showed no significant stenosis August 2017 Lexi scan Myoview has revealed ejection fraction of 26% Acute MI ruled out troponins 0.03, 0.05, 0.04 Follow-up with cardiology -[facility] -Consider adding  Florinef if no improvement in a.m.   2.  chronic ischemic Cardiomyopathy/history of coronary artery disease Toprol-XL  Echocardiogram -EF 25-30% Appreciate cardiology recommendations  Watch for volume overload while hydrating the patient given the ejection fraction was 26% according to the Lexiscan Myoview Continue aspirin, beta blocker and statin  3. Hypertension-continue beta blocker Toprol-XL  4. Hyperlipidemia-continue statin  5. History of thalamic CVA-continue aspirin and statin  6. Chronic kidney disease stage III-monitor renal function and avoid nephrotoxins Renal function at his baseline discussed with nephrology  PT evaluation-no PT needs identified  All the records are reviewed and case discussed with Care Management/Social Workerr. Management plans discussed with the patient, family and they are in agreement.  CODE STATUS: Full code until patient discusses his CODE STATUS with his wife  TOTAL TIME TAKING CARE OF THIS PATIENT: 34 minutes.   POSSIBLE D/C IN 1-2 DAYS, DEPENDING ON CLINICAL CONDITION.  Note: This dictation was prepared with Dragon dictation along with smaller phrase technology. Any transcriptional errors that result from this process are unintentional.   Nicholes Mango M.D on 06/25/2016 at 3:29 PM  Between 7am to 6pm - Pager - [phone] After 6pm go to [email or web address] - password [email or web address] [facility]  Office  [phone]  CC: Primary care physician; [name], MD

## 2016-06-25 NOTE — Progress Notes (Signed)
Pt assisted to bathroom. Once on toilet RN noticed red blood in mesh underwear. Pt states he does not have a hx of hemorrhoids. I will report to RN following on next shift.

## 2016-06-25 NOTE — Care Management (Signed)
patient had approximately 50 point drop in systolic blood pressure this morning from lying to sitting.  PT consult is pending

## 2016-06-25 NOTE — Progress Notes (Signed)
Patient: Keith Woods / Admit Date: 06/23/2016 / Date of Encounter: 06/25/2016, 7:58 AM   Subjective: No acute overnight events. Has not ambulated yet. Recheck orthostatic vital signs at noon on 11/14 still significantly positive after repeat 250 mL NS bolus and decrease of Toprol XL to 12.5 mg daily from 25 mg daily. Echo essentially unchanged from prior. Labs pending this morning.   Review of Systems: Review of Systems  Constitutional: Positive for malaise/fatigue. Negative for chills, diaphoresis, fever and weight loss.  HENT: Negative for congestion.   Eyes: Negative for discharge and redness.  Respiratory: Negative for cough, hemoptysis, sputum production, shortness of breath and wheezing.   Cardiovascular: Negative for chest pain, palpitations, orthopnea, claudication, leg swelling and PND.  Gastrointestinal: Negative for abdominal pain, blood in stool, heartburn, melena, nausea and vomiting.  Genitourinary: Negative for hematuria.  Musculoskeletal: Negative for falls and myalgias.  Skin: Negative for rash.  Neurological: Positive for weakness. Negative for dizziness, tingling, tremors, sensory change, speech change, focal weakness and loss of consciousness.  Endo/Heme/Allergies: Does not bruise/bleed easily.  Psychiatric/Behavioral: Negative for substance abuse. The patient is not nervous/anxious.   All other systems reviewed and are negative.   Objective: Telemetry: NSR, 70's bpm, PACs, LBBB Physical Exam: Blood pressure 102/62, pulse 75, temperature 98.2 F (36.8 C), temperature source Oral, resp. rate 18, height 6\' 2"  (1.88 m), weight 175 lb (79.4 kg), SpO2 98 %. Body mass index is 22.47 kg/m. General: Well developed, well nourished, in no acute distress. Head: Normocephalic, atraumatic, sclera non-icteric, no xanthomas, nares are without discharge. Neck: Negative for carotid bruits. JVP not elevated. Lungs: Clear bilaterally to auscultation without wheezes,  rales, or rhonchi. Breathing is unlabored. Heart: RRR S1 S2 without murmurs, rubs, or gallops.  Abdomen: Soft, non-tender, non-distended with normoactive bowel sounds. No rebound/guarding. Extremities: No clubbing or cyanosis. No edema. Distal pedal pulses are 2+ and equal bilaterally. Neuro: Alert and oriented X 3. Moves all extremities spontaneously. Psych:  Responds to questions appropriately with a normal affect.   Intake/Output Summary (Last 24 hours) at 06/25/16 0758 Last data filed at 06/25/16 0600  Gross per 24 hour  Intake              248 ml  Output              625 ml  Net             -377 ml    Inpatient Medications:  . acidophilus  1 capsule Oral Daily  . allopurinol  200 mg Oral Daily  . aspirin  81 mg Oral Daily  . B-complex with vitamin C  1 tablet Oral Daily  . Carbidopa-Levodopa ER  1 tablet Oral BID  . citalopram  5 mg Oral BID  . colesevelam  1,875 mg Oral BID  . heparin  5,000 Units Subcutaneous Q8H  . metoprolol succinate  12.5 mg Oral Daily  . multivitamin with minerals  1 tablet Oral Daily  . omega-3 acid ethyl esters  1 g Oral Daily  . pantoprazole  40 mg Oral Daily  . rosuvastatin  10 mg Oral Daily  . sodium bicarbonate  650 mg Oral BID  . sodium chloride flush  3 mL Intravenous Q12H  . sodium chloride flush  3 mL Intravenous Q12H  . Vitamin D (Ergocalciferol)  50,000 Units Oral Q7 days   Infusions:   Labs:  Recent Labs  06/23/16 2236 06/24/16 0531  NA 141 141  K 3.9 4.0  CL 105 109  CO2 23 24  GLUCOSE 148* 120*  BUN 66* 61*  CREATININE 1.88* 1.67*  CALCIUM 9.5 8.9   No results for input(s): AST, ALT, ALKPHOS, BILITOT, PROT, ALBUMIN in the last 72 hours.  Recent Labs  06/23/16 2236 06/24/16 0531  WBC 10.5 9.2  HGB 11.7* 10.5*  HCT 34.7* 31.0*  MCV 97.0 97.3  PLT 149* 134*    Recent Labs  06/23/16 2236 06/24/16 0531 06/24/16 1123 06/24/16 1729  TROPONINI 0.03* 0.05* 0.04* 0.03*   Invalid input(s): POCBNP No results for  input(s): HGBA1C in the last 72 hours.   Weights: Filed Weights   06/23/16 2246 06/24/16 0500 06/25/16 0500  Weight: 181 lb (82.1 kg) 181 lb (82.1 kg) 175 lb (79.4 kg)     Radiology/Studies:  Ct Head Wo Contrast  Result Date: 06/24/2016 CLINICAL DATA:  Patient found slumped over the knees.  Syncope. EXAM: CT HEAD WITHOUT CONTRAST TECHNIQUE: Contiguous axial images were obtained from the base of the skull through the vertex without intravenous contrast. COMPARISON:  06/23/2015 CT FINDINGS: BRAIN: There is mild sulcal and ventricular prominence consistent with superficial and central atrophy. No intraparenchymal hemorrhage, mass effect nor midline shift. There are patchy periventricular and subcortical white matter hypodensities consistent with chronic small vessel ischemic disease. No acute large vascular territory infarcts. No abnormal extra-axial fluid collections. Basal cisterns are patent. VASCULAR: Moderate calcific atherosclerosis of the carotid siphons. Mild ectasia of the right vertebral artery. Bilateral vertebral arteriosclerosis. SKULL: No skull fracture. No significant scalp soft tissue swelling. SINUSES/ORBITS: The mastoid air-cells are clear. The included paranasal sinuses are well-aerated.The included ocular globes and orbital contents are non-suspicious. OTHER: None. IMPRESSION: Mild involutional changes the brain likely age related. Chronic small vessel ischemic disease of periventricular white matter. No acute intracranial abnormality. Electronically Signed   By: Ashley Royalty M.D.   On: 06/24/2016 00:13   Dg Chest Portable 1 View  Result Date: 06/24/2016 CLINICAL DATA:  Patient found slumped in bathroom. Dizziness. Syncope. Initial encounter. EXAM: PORTABLE CHEST 1 VIEW COMPARISON:  Chest radiograph performed 10/15/2015 FINDINGS: The lungs are well-aerated. Peribronchial thickening is noted. There is no evidence of focal opacification, pleural effusion or pneumothorax. The  cardiomediastinal silhouette is within normal limits. No acute osseous abnormalities are seen. IMPRESSION: Peribronchial thickening noted.  Lungs otherwise grossly clear. Electronically Signed   By: Garald Balding M.D.   On: 06/24/2016 00:21     Assessment and Plan  Principal Problem:   Orthostatic hypotension Active Problems:   Syncope   Parkinson's disease ([place])   Labile hypertension   Coronary artery disease   History of CVA (cerebrovascular accident)   Cardiomyopathy, ischemic   LBBB (left bundle branch block)    1. Orthostatic hypotension/neurocardiogenic syncope: -Patient's orthostatic vitals signs continued to be significantly positive on recheck 11/14 s/p NS bolus -Likely in the setting of orthostasis and straining to have a BM with some component of Parkinson's disease  -Cannot completely rule out VT/VF associated syncope given his cardiomyopathy, continue to monitor of telemetry and plan for outpatient cardiac monitoring -Recheck orthostatic vital signs this morning -If orthostatics remain positive would plan to hold lower dose of metoprolol completely (ideally would like to keep this on board given his cardiomyopathy) as well as another 250 mL NS bolus -Must be cautious with IV fluids given his cardiomyopathy -If orthostatic vital signs are improved would like for patient to ambulate -Continue Toprol XL to 12.5 mg daily, for now -Echo essentially unchanged -Increase PO  fluids at home, has been dehydrated recently  2. ICM/chronic systolic CHF: -No signs of volume overload at this time -Given IV hydration as above -Watch for volume overload given his cardiomyopathy -Toprol XL as above -Given his labile hypertension and issues with orthostasis unable to add further HF medications at this time -Could consider midodrine, defer to primary cardiologist  -CHF education -Cannot rule out VT/VF associated syncope at this time as above  3. CAD: -Medically managed per HPI -No  symptoms concerning for angina -Continue ASA, BB, statin  -No plans for inpatient ischemic evaluation   4. CKD stage III: -Stable  5. Anemia of chronic disease: -Stable  6. Minimally elevated troponin: -Likely in the setting of hypotension and CKD -No symptoms concerning for angina   Signed, Christell Faith, PA-C [facility] Pager: [phone] 06/25/2016, 7:58 AM

## 2016-06-25 NOTE — Telephone Encounter (Signed)
Called Pt to schedule AWV with [facility] for 12/14 - knb

## 2016-06-26 ENCOUNTER — Telehealth: Payer: Self-pay | Admitting: Cardiology

## 2016-06-26 DIAGNOSIS — R55 Syncope and collapse: Secondary | ICD-10-CM | POA: Diagnosis not present

## 2016-06-26 DIAGNOSIS — I10 Essential (primary) hypertension: Secondary | ICD-10-CM | POA: Diagnosis not present

## 2016-06-26 DIAGNOSIS — E785 Hyperlipidemia, unspecified: Secondary | ICD-10-CM | POA: Diagnosis not present

## 2016-06-26 DIAGNOSIS — I951 Orthostatic hypotension: Secondary | ICD-10-CM | POA: Diagnosis not present

## 2016-06-26 LAB — BASIC METABOLIC PANEL
Anion gap: 8 (ref 5–15)
BUN: 44 mg/dL — AB (ref 6–20)
CHLORIDE: 107 mmol/L (ref 101–111)
CO2: 24 mmol/L (ref 22–32)
Calcium: 8.9 mg/dL (ref 8.9–10.3)
Creatinine, Ser: 1.68 mg/dL — ABNORMAL HIGH (ref 0.61–1.24)
GFR calc Af Amer: 40 mL/min — ABNORMAL LOW (ref >60)
GFR calc non Af Amer: 35 mL/min — ABNORMAL LOW (ref >60)
GLUCOSE: 99 mg/dL (ref 65–99)
POTASSIUM: 3.9 mmol/L (ref 3.5–5.1)
SODIUM: 139 mmol/L (ref 135–145)

## 2016-06-26 LAB — CBC
HEMATOCRIT: 27.7 % — AB (ref 40.0–52.0)
HEMATOCRIT: 30.2 % — AB (ref 40.0–52.0)
HEMOGLOBIN: 10 g/dL — AB (ref 13.0–18.0)
Hemoglobin: 9.3 g/dL — ABNORMAL LOW (ref 13.0–18.0)
MCH: 32.7 pg (ref 26.0–34.0)
MCH: 32.5 pg (ref 26.0–34.0)
MCHC: 33.6 g/dL (ref 32.0–36.0)
MCHC: 33 g/dL (ref 32.0–36.0)
MCV: 97.2 fL (ref 80.0–100.0)
MCV: 98.6 fL (ref 80.0–100.0)
Platelets: 127 10*3/uL — ABNORMAL LOW (ref 150–440)
Platelets: 130 10*3/uL — ABNORMAL LOW (ref 150–440)
RBC: 2.85 MIL/uL — ABNORMAL LOW (ref 4.40–5.90)
RBC: 3.06 MIL/uL — AB (ref 4.40–5.90)
RDW: 15.4 % — AB (ref 11.5–14.5)
RDW: 15.7 % — ABNORMAL HIGH (ref 11.5–14.5)
WBC: 6.9 10*3/uL (ref 3.8–10.6)
WBC: 6.4 10*3/uL (ref 3.8–10.6)

## 2016-06-26 LAB — GLUCOSE, CAPILLARY: Glucose-Capillary: 102 mg/dL — ABNORMAL HIGH (ref 65–99)

## 2016-06-26 MED ORDER — FLUDROCORTISONE ACETATE 0.1 MG PO TABS
0.0500 mg | ORAL_TABLET | Freq: Every day | ORAL | Status: DC
  Administered 2016-06-26: 0.05 mg via ORAL
  Filled 2016-06-26: qty 0.5

## 2016-06-26 MED ORDER — FLUDROCORTISONE 0.1 MG/ML ORAL SUSPENSION
0.0500 mg | Freq: Every day | ORAL | Status: DC
  Filled 2016-06-26: qty 0.5

## 2016-06-26 MED ORDER — MIDODRINE HCL 5 MG PO TABS
5.0000 mg | ORAL_TABLET | Freq: Two times a day (BID) | ORAL | Status: DC
  Administered 2016-06-26 – 2016-06-28 (×4): 5 mg via ORAL
  Filled 2016-06-26 (×4): qty 1

## 2016-06-26 NOTE — Telephone Encounter (Signed)
Follow up  Pt voiced returning nurses call, no answer.  Please f/u with pt

## 2016-06-26 NOTE — Telephone Encounter (Signed)
No answer when dialed. Left msg for daughter (listed DPR) to call.

## 2016-06-26 NOTE — Progress Notes (Signed)
   Discussed with MD. Will discontinue Florinef and continue low-dose midodrine as there is less fluid retention with midodrine.

## 2016-06-26 NOTE — Care Management (Signed)
Remains orthostatic.  Metoprolol on hold

## 2016-06-26 NOTE — Progress Notes (Signed)
Patient: Keith Woods / Admit Date: 06/23/2016 / Date of Encounter: 06/26/2016, 9:41 AM   Subjective: Continues to have positive orthostatic vital signs. Metoprolol 12.5 mg held this morning. Also noted some blood in mesh underwear this morning after using the bathroom. Prior history of hemorrhoids. HGB down trending to 9.3 today.   Review of Systems: Review of Systems  Constitutional: Positive for malaise/fatigue. Negative for chills, diaphoresis, fever and weight loss.  HENT: Negative for congestion.   Eyes: Negative for discharge and redness.  Respiratory: Positive for shortness of breath. Negative for cough, hemoptysis, sputum production and wheezing.   Cardiovascular: Negative for chest pain, palpitations, orthopnea, claudication, leg swelling and PND.  Gastrointestinal: Positive for blood in stool and melena. Negative for abdominal pain, heartburn, nausea and vomiting.  Genitourinary: Negative for hematuria.  Musculoskeletal: Negative for falls and myalgias.  Skin: Negative for rash.  Neurological: Positive for dizziness and weakness. Negative for tingling, tremors, sensory change, speech change, focal weakness and loss of consciousness.  Endo/Heme/Allergies: Does not bruise/bleed easily.  Psychiatric/Behavioral: Negative for substance abuse. The patient is not nervous/anxious.   All other systems reviewed and are negative.   Objective: Telemetry: NSR, 70's LBBB Physical Exam: Blood pressure 117/63, pulse 77, temperature 98 F (36.7 C), temperature source Oral, resp. rate 18, height 6\' 2"  (1.88 m), weight 179 lb 1.6 oz (81.2 kg), SpO2 95 %. Body mass index is 23 kg/m. General: Well developed, well nourished, in no acute distress. Head: Normocephalic, atraumatic, sclera non-icteric, no xanthomas, nares are without discharge. Neck: Negative for carotid bruits. JVP not elevated. Lungs: Clear bilaterally to auscultation without wheezes, rales, or rhonchi. Breathing is  unlabored. Heart: RRR S1 S2 without murmurs, rubs, or gallops.  Abdomen: Soft, non-tender, non-distended with normoactive bowel sounds. No rebound/guarding. Extremities: No clubbing or cyanosis. No edema. Distal pedal pulses are 2+ and equal bilaterally. Neuro: Alert and oriented X 3. Moves all extremities spontaneously. Psych:  Responds to questions appropriately with a normal affect.   Intake/Output Summary (Last 24 hours) at 06/26/16 0941 Last data filed at 06/25/16 1353  Gross per 24 hour  Intake              480 ml  Output                0 ml  Net              480 ml    Inpatient Medications:  . acidophilus  1 capsule Oral Daily  . allopurinol  200 mg Oral Daily  . aspirin  81 mg Oral Daily  . B-complex with vitamin C  1 tablet Oral Daily  . Carbidopa-Levodopa ER  1 tablet Oral BID  . citalopram  5 mg Oral BID  . colesevelam  1,875 mg Oral BID  . heparin  5,000 Units Subcutaneous Q8H  . multivitamin with minerals  1 tablet Oral Daily  . omega-3 acid ethyl esters  1 g Oral Daily  . pantoprazole  40 mg Oral Daily  . rosuvastatin  10 mg Oral Daily  . sodium bicarbonate  650 mg Oral BID  . sodium chloride flush  3 mL Intravenous Q12H  . sodium chloride flush  3 mL Intravenous Q12H  . Vitamin D (Ergocalciferol)  50,000 Units Oral Q7 days   Infusions:   Labs:  Recent Labs  06/25/16 0815 06/26/16 0341  NA 141 139  K 3.9 3.9  CL 108 107  CO2 27 24  GLUCOSE 98  99  BUN 52* 44*  CREATININE 1.81* 1.68*  CALCIUM 9.3 8.9  MG 1.8  --    No results for input(s): AST, ALT, ALKPHOS, BILITOT, PROT, ALBUMIN in the last 72 hours.  Recent Labs  06/25/16 0815 06/26/16 0341  WBC 6.7 6.9  HGB 10.0* 9.3*  HCT 30.4* 27.7*  MCV 97.7 97.2  PLT 134* 127*    Recent Labs  06/23/16 2236 06/24/16 0531 06/24/16 1123 06/24/16 1729  TROPONINI 0.03* 0.05* 0.04* 0.03*   Invalid input(s): POCBNP No results for input(s): HGBA1C in the last 72 hours.   Weights: Filed Weights     06/25/16 0500 06/25/16 1956 06/26/16 0600  Weight: 175 lb (79.4 kg) 179 lb 1.6 oz (81.2 kg) 179 lb 1.6 oz (81.2 kg)     Radiology/Studies:  Ct Head Wo Contrast  Result Date: 06/24/2016 CLINICAL DATA:  Patient found slumped over the knees.  Syncope. EXAM: CT HEAD WITHOUT CONTRAST TECHNIQUE: Contiguous axial images were obtained from the base of the skull through the vertex without intravenous contrast. COMPARISON:  06/23/2015 CT FINDINGS: BRAIN: There is mild sulcal and ventricular prominence consistent with superficial and central atrophy. No intraparenchymal hemorrhage, mass effect nor midline shift. There are patchy periventricular and subcortical white matter hypodensities consistent with chronic small vessel ischemic disease. No acute large vascular territory infarcts. No abnormal extra-axial fluid collections. Basal cisterns are patent. VASCULAR: Moderate calcific atherosclerosis of the carotid siphons. Mild ectasia of the right vertebral artery. Bilateral vertebral arteriosclerosis. SKULL: No skull fracture. No significant scalp soft tissue swelling. SINUSES/ORBITS: The mastoid air-cells are clear. The included paranasal sinuses are well-aerated.The included ocular globes and orbital contents are non-suspicious. OTHER: None. IMPRESSION: Mild involutional changes the brain likely age related. Chronic small vessel ischemic disease of periventricular white matter. No acute intracranial abnormality. Electronically Signed   By: Ashley Royalty M.D.   On: 06/24/2016 00:13   Dg Chest Portable 1 View  Result Date: 06/24/2016 CLINICAL DATA:  Patient found slumped in bathroom. Dizziness. Syncope. Initial encounter. EXAM: PORTABLE CHEST 1 VIEW COMPARISON:  Chest radiograph performed 10/15/2015 FINDINGS: The lungs are well-aerated. Peribronchial thickening is noted. There is no evidence of focal opacification, pleural effusion or pneumothorax. The cardiomediastinal silhouette is within normal limits. No acute  osseous abnormalities are seen. IMPRESSION: Peribronchial thickening noted.  Lungs otherwise grossly clear. Electronically Signed   By: Garald Balding M.D.   On: 06/24/2016 00:21     Assessment and Plan  Principal Problem:   Orthostatic hypotension Active Problems:   Syncope   Parkinson's disease ([place])   Labile hypertension   Coronary artery disease   History of CVA (cerebrovascular accident)   Cardiomyopathy, ischemic   LBBB (left bundle branch block)    1. Orthostatic hypotension/neurocardiogenic syncope: -Patient's orthostatic vitals signs continue to be significantly positive on recheck 11/16 s/p multiple NS bolus -Likely in the setting of orthostasis and straining to have a BM with some component of Parkinson's disease  -Cannot completely rule out VT/VF associated syncope given his cardiomyopathy, continue to monitor of telemetry and plan for outpatient cardiac monitoring -Given persistently positive orthostatic vital signs agree with holding metoprolol this morning  -His BRBPR and down trending hgb may also be playing a role in his symptoms, recommend IM work up possible GI bleed -Must be cautious with IV fluids given his cardiomyopathy -As orthostatic vital signs improve would like for patient to ambulate -Echo essentially unchanged -Increase PO fluids at home, has been dehydrated recently  2. ICM/chronic systolic  CHF: -No signs of volume overload at this time -Given IV hydration as above -Watch for volume overload given his cardiomyopathy -Toprol XL held as above -Given his labile hypertension and issues with orthostasis unable to add further HF medications at this time -CHF education -Cannot rule out VT/VF associated syncope at this time as above  3. CAD: -Medically managed per HPI -No symptoms concerning for angina -Continue ASA and statin  -BB on hold as above -No plans for inpatient ischemic evaluation   4. CKD stage III: -Stable  5. Acute on chronic  anemia of chronic disease: -Hgb down trending this morning to 9.3 from 10.0 the day prior and an admission hgb of 11.7 three days prior -There was some noted BRBPR this morning -Recommend evaluation for possible GI bleed per IM  6. Minimally elevated troponin: -Likely in the setting of hypotension and CKD -No symptoms concerning for angina   Signed, Christell Faith, PA-C [facility] Pager: [phone] 06/26/2016, 9:41 AM

## 2016-06-26 NOTE — Progress Notes (Signed)
Patient ID: Keith Woods, male   DOB: 02-Nov-1925, 82 y.o.   MRN: [id number]   [name]    MR#:  [id number]  DATE OF BIRTH:  10/03/1925  SUBJECTIVE:  CHIEF COMPLAINT:   Chief Complaint  Patient presents with  . Loss of Consciousness  Patient seen and examined at bedside. Reports that he has been changing positions slowly and without difficulty. He is still significantly orthostatic per nursing documentation however he denies symptoms of chest pain, shortness of breath, dizziness and palpitations.  REVIEW OF SYSTEMS:  ROS  CONSTITUTIONAL: No fever, fatigue or weakness.  EYES: No blurred or double vision.  EARS, NOSE, AND THROAT: No tinnitus or ear pain.  RESPIRATORY: No cough, shortness of breath, wheezing or hemoptysis.  CARDIOVASCULAR: No chest pain, orthopnea, edema.  GASTROINTESTINAL: No nausea, vomiting, diarrhea or abdominal pain.  GENITOURINARY: No dysuria, hematuria.  ENDOCRINE: No polyuria, nocturia,  HEMATOLOGY: No anemia, easy bruising or bleeding SKIN: No rash or lesion. MUSCULOSKELETAL: No joint pain or arthritis.   NEUROLOGIC: No tingling, numbness, weakness.  PSYCHIATRY: No anxiety or depression.   DRUG ALLERGIES:   Allergies  Allergen Reactions  . Lipitor [Atorvastatin Calcium]   . Nsaids   . Statins    VITALS:  Blood pressure (!) 108/49, pulse 69, temperature 98.3 F (36.8 C), temperature source Oral, resp. rate 18, height 6\' 2"  (1.88 m), weight 81.2 kg (179 lb 1.6 oz), SpO2 95 %. PHYSICAL EXAMINATION:  Physical Exam  GENERAL:  82 y.o.-year-old patient lying in the bed with no acute distress.  EYES: Pupils equal, round, reactive to light and accommodation. No scleral icterus. Extraocular muscles intact.  HEENT: Head atraumatic, normocephalic. Oropharynx and nasopharynx clear.  NECK:  Supple, no jugular venous distention. No thyroid enlargement, no tenderness.  LUNGS:  Normal breath sounds bilaterally, no wheezing, rales,rhonchi or crepitation. No use of accessory muscles of respiration.  CARDIOVASCULAR: S1, S2 normal. No murmurs, rubs, or gallops.  ABDOMEN: Soft, nontender, nondistended. Bowel sounds present. No organomegaly or mass.  EXTREMITIES: No pedal edema, cyanosis, or clubbing.  NEUROLOGIC: Cranial nerves II through XII are intact. Muscle strength 5/5 in all extremities. Sensation intact. Gait not checked.  PSYCHIATRIC: The patient is alert and oriented x 3.  SKIN: No obvious rash, lesion, or ulcer.   LABORATORY PANEL:   CBC  Recent Labs Lab 06/26/16 0341  WBC 6.9  HGB 9.3*  HCT 27.7*  PLT 127*   ------------------------------------------------------------------------------------------------------------------ Chemistries   Recent Labs Lab 06/25/16 0815 06/26/16 0341  NA 141 139  K 3.9 3.9  CL 108 107  CO2 27 24  GLUCOSE 98 99  BUN 52* 44*  CREATININE 1.81* 1.68*  CALCIUM 9.3 8.9  MG 1.8  --    RADIOLOGY:  No results found. ASSESSMENT AND PLAN:   82 year old male patient with history of cardiomyopathy, hypertension, hyperlipidemia, gout, prostate cancer, panic CVA, left bundle branch block presented to the emergency room after he passed out.  1.Syncope and collapse secondary to orthostatic hypotension/neurocardiogenic syncope Monitor patient on telemetry Gentle hydration with IV fluids  Echocardiogram-25-30% ejection fraction PT evaluation-no PT needs identified Carotid ultrasound in July 2017 showed no significant stenosis August 2017 Lexi scan Myoview has revealed ejection fraction of 26% Acute MI ruled out troponins 0.03, 0.05, 0.04 Follow-up with cardiology -[facility] Beta blocker held secondary to persistent and significant orthostatic hypotension. We'll resume Florine[place].  2.Chronic ischemic Cardiomyopathy/history of coronary artery disease Toprol-XL on  hold for now.  Echocardiogram -EF 25-30% Appreciate  cardiology recommendations  Watch for volume overload while hydrating the patient given the ejection fraction was 26% according to the Lexiscan Myoview Continue aspirin, beta blocker and statin  3.Hypertension-Monitor and hold beta blocker Toprol-XL  4.Hyperlipidemia-continue statin  5.History of thalamic CVA-continue aspirin and statin  6. Chronic kidney disease stage III-monitor renal function and avoid nephrotoxins Renal function at his baseline discussed with nephrology  PT evaluation-no PT needs identified  All the records are reviewed and case discussed with Care Management/Social Workerr. Management plans discussed with the patient, family and they are in agreement.  CODE STATUS: Full code until patient discusses his CODE STATUS with his wife  TOTAL TIME TAKING CARE OF THIS PATIENT: 30 minutes.   POSSIBLE D/C IN 1-2 DAYS, DEPENDING ON CLINICAL CONDITION.   All the records are reviewed and case discussed with Care Management/Social Worker. Management plans discussed with the patient, family and they are in agreement. More than 50% of the time was spent in counseling/coordination of care: YES  Keith Woods D.O. on 06/26/2016 at 12:36 PM  Between 7am to 6pm - Pager - [phone]  After 6pm go to [email or web address] - Proofreader  [facility]  Office  [phone]  CC: Primary care physician; [name], MD  Note: This dictation was prepared with Dragon dictation along with smaller phrase technology. Any transcriptional errors that result from this process are unintentional.

## 2016-06-26 NOTE — Progress Notes (Signed)
Made dr. Ara Kussmaul aware patient is having bloody drainage from his penis. Per md order repeat cbc at 1800 and okay to scheduled dose of heparin at this time. Current hgb 9.3 will continue to monitor

## 2016-06-26 NOTE — Progress Notes (Signed)
Spoke with dr. Allene Dillon regarding patients positive orthostatic bp and scheduled metoprolol of 12.5. Per md discontinue metoprolol and continue to monitor blood pressure

## 2016-06-26 NOTE — Telephone Encounter (Signed)
Spoke with daughter pt is at [facility] since Monday and states that pt had an echo on Tuesday and states that they will not discharge pt because he is experiencing hypotension and they cannot control it. Daughter is concerned and wondering why they cannot fix and discharge pt? She states that she can see online that Dr Martinique has been consulted(?) but is wondering why this is still happening? Please advise.

## 2016-06-26 NOTE — Telephone Encounter (Signed)
New message  Pt daughter call requesting to speak with RN about pt being hospitalized. Please call back to discuss

## 2016-06-26 NOTE — Telephone Encounter (Signed)
06/23/2016 - present (3 days) Keith Salinas, DO  Last attending . Treatment team   Orthostatic hypotension  Principal problem    LMTCB-daughter

## 2016-06-27 DIAGNOSIS — E785 Hyperlipidemia, unspecified: Secondary | ICD-10-CM | POA: Diagnosis not present

## 2016-06-27 DIAGNOSIS — I10 Essential (primary) hypertension: Secondary | ICD-10-CM | POA: Diagnosis not present

## 2016-06-27 DIAGNOSIS — R55 Syncope and collapse: Secondary | ICD-10-CM | POA: Diagnosis not present

## 2016-06-27 DIAGNOSIS — I951 Orthostatic hypotension: Secondary | ICD-10-CM | POA: Diagnosis not present

## 2016-06-27 LAB — CBC
HCT: 27.2 % — ABNORMAL LOW (ref 40.0–52.0)
HEMOGLOBIN: 9.3 g/dL — AB (ref 13.0–18.0)
MCH: 33.2 pg (ref 26.0–34.0)
MCHC: 34.3 g/dL (ref 32.0–36.0)
MCV: 97 fL (ref 80.0–100.0)
Platelets: 129 10*3/uL — ABNORMAL LOW (ref 150–440)
RBC: 2.81 MIL/uL — AB (ref 4.40–5.90)
RDW: 15.3 % — ABNORMAL HIGH (ref 11.5–14.5)
WBC: 6.5 10*3/uL (ref 3.8–10.6)

## 2016-06-27 LAB — BASIC METABOLIC PANEL
Anion gap: 8 (ref 5–15)
BUN: 37 mg/dL — AB (ref 6–20)
CHLORIDE: 106 mmol/L (ref 101–111)
CO2: 24 mmol/L (ref 22–32)
Calcium: 9 mg/dL (ref 8.9–10.3)
Creatinine, Ser: 1.74 mg/dL — ABNORMAL HIGH (ref 0.61–1.24)
GFR calc non Af Amer: 33 mL/min — ABNORMAL LOW (ref >60)
GFR, EST AFRICAN AMERICAN: 39 mL/min — AB (ref >60)
Glucose, Bld: 101 mg/dL — ABNORMAL HIGH (ref 65–99)
POTASSIUM: 3.7 mmol/L (ref 3.5–5.1)
SODIUM: 138 mmol/L (ref 135–145)

## 2016-06-27 MED ORDER — SODIUM CHLORIDE 0.9 % IV SOLN
INTRAVENOUS | Status: DC
  Administered 2016-06-27 – 2016-06-28 (×2): via INTRAVENOUS

## 2016-06-27 NOTE — Telephone Encounter (Signed)
Left message for daughter of dr jordan's recommendations. She is to call back with other concerns.

## 2016-06-27 NOTE — Progress Notes (Signed)
Patient Name: Keith Woods Date of Encounter: 06/27/2016  Primary Cardiologist: P. Martinique, MD   Hospital Problem List     Principal Problem:   Orthostatic hypotension Active Problems:   Labile hypertension   Coronary artery disease   LBBB (left bundle branch block)   Syncope   Parkinson's disease ([place])   History of CVA (cerebrovascular accident)   Cardiomyopathy, ischemic     Subjective   Started on midodrine yesterday.  blocker d/c'd.  Still orthostatic (133 standing, 97 standing @ 3 minutes) but was asymptomatic.  Very eager to go home.  Inpatient Medications    . acidophilus  1 capsule Oral Daily  . allopurinol  200 mg Oral Daily  . aspirin  81 mg Oral Daily  . B-complex with vitamin C  1 tablet Oral Daily  . Carbidopa-Levodopa ER  1 tablet Oral BID  . citalopram  5 mg Oral BID  . colesevelam  1,875 mg Oral BID  . heparin  5,000 Units Subcutaneous Q8H  . midodrine  5 mg Oral BID WC  . multivitamin with minerals  1 tablet Oral Daily  . omega-3 acid ethyl esters  1 g Oral Daily  . pantoprazole  40 mg Oral Daily  . rosuvastatin  10 mg Oral Daily  . sodium bicarbonate  650 mg Oral BID  . sodium chloride flush  3 mL Intravenous Q12H  . sodium chloride flush  3 mL Intravenous Q12H  . Vitamin D (Ergocalciferol)  50,000 Units Oral Q7 days    Vital Signs    Vitals:   06/27/16 0520 06/27/16 0521 06/27/16 0739 06/27/16 1206  BP:  (!) 125/59 137/67 (!) 133/59  Pulse:  64 66 74  Resp:  18 18 18   Temp:  97.9 F (36.6 C) 98.1 F (36.7 C) 98.5 F (36.9 C)  TempSrc:   Oral Oral  SpO2:  96% 95% 95%  Weight: 176 lb 1.6 oz (79.9 kg)     Height:        Intake/Output Summary (Last 24 hours) at 06/27/16 1259 Last data filed at 06/27/16 1010  Gross per 24 hour  Intake              480 ml  Output                0 ml  Net              480 ml   Filed Weights   06/25/16 1956 06/26/16 0600 06/27/16 0520  Weight: 179 lb 1.6 oz (81.2 kg) 179 lb 1.6 oz (81.2  kg) 176 lb 1.6 oz (79.9 kg)    Physical Exam   GEN: Well nourished, well developed, in no acute distress.  HEENT: Grossly normal.  Neck: Supple, no JVD, carotid bruits, or masses. Cardiac: RRR, no murmurs, rubs, or gallops. No clubbing, cyanosis, edema.  Radials/DP/PT 2+ and equal bilaterally.  Respiratory:  Respirations regular and unlabored, clear to auscultation bilaterally. GI: Soft, nontender, nondistended, BS + x 4. MS: no deformity or atrophy. Skin: warm and dry, no rash. Neuro:  Strength and sensation are intact. Psych: AAOx3.  Normal affect.  Labs    CBC  Recent Labs  06/26/16 1804 06/27/16 0512  WBC 6.4 6.5  HGB 10.0* 9.3*  HCT 30.2* 27.2*  MCV 98.6 97.0  PLT 130* [id number]*   Basic Metabolic Panel  Recent Labs  06/25/16 0815 06/26/16 0341 06/27/16 0512  NA 141 139 138  K 3.9 3.9 3.7  CL  108 107 106  CO2 27 24 24   GLUCOSE 98 99 101*  BUN 52* 44* 37*  CREATININE 1.81* 1.68* 1.74*  CALCIUM 9.3 8.9 9.0  MG 1.8  --   --    Cardiac Enzymes  Recent Labs  06/24/16 1729  TROPONINI 0.03*    Telemetry    rsr   Radiology    No results found.  Assessment & Plan    1.  Orthostatic hypotension/syncope/vasovagal syncope:  Admitted 11/14 following syncopal episode while using the toilet.  Profoundly orthostatic on admission with long h/o orthostasis in the setting of parkinson's.   blocker d/c'd.  Now on midodrine.  Volume status stable.  Still orthostatic (drop from 133 to 97 with standing @ 3 mins) but asymptomatic.  He is eager to go home.  We had a long discussion (45 mins) re: the mgmt of both orthostatic hypotension along with what role parkinsons and high vagal tone is likely playing in his symptoms.  Cont midodrine @ current dose.  Discussed possible role of using an abdominal binder @ home.  Ambulate today.  If asymptomatic, can likely go home with early outpt f/u.  He is considering switching his cardiology care to our [place] office.  2.  Chronic  systolic chf/ICM:  Euvolemic.  We discussed the importance of daily weights, sodium restriction, medication compliance, and symptom reporting and he verbalizes understanding.  He is now off of  blocker due to #1.   Signed, Murray Hodgkins NP 06/27/2016, 12:59 PM

## 2016-06-27 NOTE — Telephone Encounter (Signed)
Spoke to patient's daughter Ivin Booty Dr.Jordan's recommendations given.

## 2016-06-27 NOTE — Progress Notes (Signed)
Patient ID: Keith Woods, male   DOB: [birth year], 82 y.o.   MRN: [id number]   [facility] [place] NAME: Keith Woods    MR#:  [id number]  DATE OF BIRTH:  05/29/1926  SUBJECTIVE:  CHIEF COMPLAINT:   Chief Complaint  Patient presents with  . Loss of Consciousness  Patient seen and examined at bedside this morning. Reports frustration with continued hospitalization. Florinef switched to midodrine yesterday by cardiology of which patient has taken one dose remains persistently orthostatic. He denies any chest pain, shortness of breath, palpitations or dizziness or lightheadedness has been switching positions cautiously.  REVIEW OF SYSTEMS:  ROS CONSTITUTIONAL: No fever, fatigue or weakness.  EYES: No blurred or double vision.  EARS, NOSE, AND THROAT: No tinnitus or ear pain.  RESPIRATORY: No cough, shortness of breath, wheezing or hemoptysis.  CARDIOVASCULAR: No chest pain, orthopnea, edema.  GASTROINTESTINAL: No nausea, vomiting, diarrhea or abdominal pain.  GENITOURINARY: No dysuria, hematuria.  ENDOCRINE: No polyuria, nocturia,  HEMATOLOGY: No anemia, easy bruising or bleeding SKIN: No rash or lesion. MUSCULOSKELETAL: No joint pain or arthritis.  NEUROLOGIC: No tingling, numbness, weakness.  PSYCHIATRY: No anxiety or depression.   DRUG ALLERGIES:   Allergies  Allergen Reactions  . Lipitor [Atorvastatin Calcium]   . Nsaids   . Statins    VITALS:  Blood pressure (!) 133/59, pulse 74, temperature 98.5 F (36.9 C), temperature source Oral, resp. rate 18, height 6\' 2"  (1.88 m), weight 79.9 kg (176 lb 1.6 oz), SpO2 95 %. PHYSICAL EXAMINATION:  Physical Exam  GENERAL: 82 y.o.-year-old patient lying in the bed with no acute distress.  EYES: Pupils equal, round, reactive to light and accommodation. No scleral icterus. Extraocular muscles intact.  HEENT: Head atraumatic, normocephalic. Oropharynx and nasopharynx clear.  NECK:  Supple, no jugular venous distention. No thyroid enlargement, no tenderness.  LUNGS: Normal breath sounds bilaterally, no wheezing, rales,rhonchi or crepitation. No use of accessory muscles of respiration.  CARDIOVASCULAR: S1, S2 normal. No murmurs, rubs, or gallops.  ABDOMEN: Soft, nontender, nondistended. Bowel sounds present. No organomegaly or mass.  EXTREMITIES: No pedal edema, cyanosis, or clubbing.  NEUROLOGIC: Cranial nerves II through XII are intact. Muscle strength 5/5 in all extremities. Sensation intact. Gait not checked.  PSYCHIATRIC: The patient is alert and oriented x 3.  SKIN: No obvious rash, lesion, or ulcer.  LABORATORY PANEL:   CBC  Recent Labs Lab 06/27/16 0512  WBC 6.5  HGB 9.3*  HCT 27.2*  PLT 129*   ------------------------------------------------------------------------------------------------------------------ Chemistries   Recent Labs Lab 06/25/16 0815  06/27/16 0512  NA 141  < > 138  K 3.9  < > 3.7  CL 108  < > 106  CO2 27  < > 24  GLUCOSE 98  < > 101*  BUN 52*  < > 37*  CREATININE 1.81*  < > 1.74*  CALCIUM 9.3  < > 9.0  MG 1.8  --   --   < > = values in this interval not displayed. RADIOLOGY:  No results found. ASSESSMENT AND PLAN:    82 year old male patient with history of cardiomyopathy, hypertension, hyperlipidemia, gout, prostate cancer, panic CVA, left bundle branch block presented to the emergency room after he passed out.  1.Syncope and collapse secondary to orthostatic hypotension/neurocardiogenic syncope Monitor patient on telemetry Gentle hydration with IV fluids  Echocardiogram-25-30% ejection fraction PT evaluation-no PT needs identified Carotid ultrasound in July 2017 showed no significant stenosis August 2017 Lexi scan  Myoview has revealed ejection fraction of 26% Acute MI ruled out troponins 0.03, 0.05, 0.04 Follow-up with cardiology -[facility] Beta blocker discontinued. Midodrine initiated.  2.Chronic ischemic  Cardiomyopathy/history of coronary artery disease Toprol-XL discontinued Echocardiogram -EF 25-30% Appreciate cardiology recommendations  Watch for volume overload while hydrating the patient given the ejection fraction was 26% according to the Lexiscan Myoview Continue aspirin, beta blocker and statin  3.Hypertension-Monitor and hold beta blocker Toprol-XL  4.Hyperlipidemia-continue statin  5.History of thalamic CVA-continue aspirin and statin  6. Chronic kidney disease stage III-monitor renal function and avoid nephrotoxins Renal function at his baseline discussed with nephrology  PT evaluation-no PT needs identified  The patient remains ambulatory without difficulty and asymptomatic with respect to his orthostatic hypotension may be discharged in morning  All the records are reviewed and case discussed with Care Management/Social Workerr. Management plans discussed with the patient, family and they are in agreement.  CODE STATUS: Full code until patient discusses his CODE STATUS with his wife  TOTAL TIME TAKING CARE OF THIS PATIENT: 22minutes.   POSSIBLE D/C IN 1-2 DAYS, DEPENDING ON CLINICAL CONDITION.   Keith Woods D.O. on 06/27/2016 at 3:27 PM  Between 7am to 6pm - Pager - [phone]  After 6pm go to [email or web address] - Proofreader  [facility]  Office  [phone]  CC: Primary care physician; Keith Huh, MD  Note: This dictation was prepared with Dragon dictation along with smaller phrase technology. Any transcriptional errors that result from this process are unintentional.

## 2016-06-27 NOTE — Progress Notes (Signed)
Continues to be orthostatic,midodrine in progress,iv fluids in progress.

## 2016-06-27 NOTE — Telephone Encounter (Signed)
He is followed by our service at [facility]. I know Dr. Fletcher Anon has seen him. Looks like they are trying to adjust meds (stopping beta blocker, adding Midodrine, ? Florinef). Daughter needs to be patient. They are trying!  Cicely Ortner Martin[place] MD, [facility]

## 2016-06-28 DIAGNOSIS — R55 Syncope and collapse: Secondary | ICD-10-CM | POA: Diagnosis not present

## 2016-06-28 DIAGNOSIS — I429 Cardiomyopathy, unspecified: Secondary | ICD-10-CM | POA: Diagnosis not present

## 2016-06-28 DIAGNOSIS — I10 Essential (primary) hypertension: Secondary | ICD-10-CM | POA: Diagnosis not present

## 2016-06-28 DIAGNOSIS — I951 Orthostatic hypotension: Secondary | ICD-10-CM | POA: Diagnosis not present

## 2016-06-28 LAB — GLUCOSE, CAPILLARY: GLUCOSE-CAPILLARY: 114 mg/dL — AB (ref 65–99)

## 2016-06-28 MED ORDER — ASPIRIN 81 MG PO TBEC: 81.0000 mg | DELAYED_RELEASE_TABLET | Freq: Every day | ORAL | 0 refills | Status: DC

## 2016-06-28 MED ORDER — MIDODRINE HCL 5 MG PO TABS: 5.0000 mg | ORAL_TABLET | Freq: Two times a day (BID) | ORAL | 0 refills | Status: DC

## 2016-06-28 NOTE — Progress Notes (Signed)
Patient discharged home as ordered,instructions explained and well understood,hemodynamics within normal limits,escorted by staff member and familty via wheel chair.

## 2016-06-28 NOTE — Discharge Summary (Addendum)
[facility] [place] NAME: Keith Woods    MR#:  [id number]  DATE OF BIRTH:  09/22/1925  DATE OF ADMISSION:  06/23/2016   ADMITTING PHYSICIAN: Saundra Shelling, MD  DATE OF DISCHARGE: 06/28/16  PRIMARY CARE PHYSICIAN: [name], MD   ADMISSION DIAGNOSIS:  Syncope and collapse [R55] DISCHARGE DIAGNOSIS:  Principal Problem:   Orthostatic hypotension Active Problems:   Labile hypertension   Coronary artery disease   LBBB (left bundle branch block)   Syncope   Parkinson's disease ([place])   History of CVA (cerebrovascular accident)   Cardiomyopathy, ischemic  SECONDARY DIAGNOSIS:   Past Medical History:  Diagnosis Date  . Coronary artery disease    NONOBSTRUCTIVE by cath 2008  . GERD (gastroesophageal reflux disease)   . History of prostate cancer   . History of transient ischemic attack (TIA)   . Hyperlipidemia   . Hypertension   . LBBB (left bundle branch block)   . Syncope and collapse    HOSPITAL COURSE:   Mr. Showell is an 82 year old white male with a history of cardiomyopathy with ejection fraction of 25%, CVA, GERD, hypertension, hyperlipidemia and left bundle branch block who presented to the emergency department after syncopal episode he is admitted to telemetry for observation, echocardiogram and cardiology consultation. He was found to be profoundly orthostatic. He apparently had been on Florinef for many years but was discontinued apparently secondary to fluid retention. Dr.Kolluru had been following the patient's orthostatic hypotension for some time. In the hospital he remained hypotensive despite decreasing his beta blocker which was eventually discontinued completely. He was started on Florinef which was changed to midodrine by cardiology. He remained orthostatic however his symptoms improved significantly and was educated on therapeutic lifestyle changes including changing positions slowly and wearing an  abdominal binder.  Of note patient reported complaint of hematuria during his hospitalization which he later clarified and described as some mild pink tinged urine which was dribbling. His urine stream was clear and he denied any gross hematuria. He states that he has had this in the past and does follow with a urologist and nephrologist.   Today in the day of discharge the patient has stable vital signs, has been eating and drinking, and bleeding about the room and changing positions without difficulty and has urine and stool output.  Studies of note included negative head CT which showed only chronic small vessel ischemic disease but no acute intracranial abnormality Chest x-ray on admission showed peribronchiolar thickening but no other acute abnormality Echocardiography revealed severe LV systolic dysfunction not significantly changed from July 2017 DISCHARGE CONDITIONS:  Orthostatic hypotension Chronic ischemic cardiomyopathy with systolic dysfunction Coronary artery disease Hypertension Hyperlipidemia History of thalamic CVA CKD stage III CONSULTS OBTAINED:  Treatment Team:  Nelva Bush, MD DRUG ALLERGIES:   Allergies  Allergen Reactions  . Lipitor [Atorvastatin Calcium]   . Nsaids   . Statins    DISCHARGE MEDICATIONS:     Medication List    STOP taking these medications   metoprolol succinate 25 MG 24 hr tablet Commonly known as:  TOPROL XL   predniSONE 5 MG tablet Commonly known as:  DELTASONE     TAKE these medications   ALIGN 4 MG Caps Take 4 mg by mouth daily.   allopurinol 100 MG tablet Commonly known as:  ZYLOPRIM Take 2 tablets (200 mg total) by mouth daily.   aspirin 81 MG EC tablet Take 1 tablet (81 mg total)  by mouth daily. Start taking on:  06/29/2016 What changed:  medication strength  how much to take  additional instructions   b complex vitamins tablet Take 1 tablet by mouth daily.   Carbidopa-Levodopa ER 25-100 MG tablet  controlled release Commonly known as:  SINEMET CR Take 1 tablet by mouth 2 (two) times daily.   citalopram 10 MG tablet Commonly known as:  CELEXA Take 0.5 tablets (5 mg total) by mouth 2 (two) times daily.   Colchicine 0.6 MG Caps Commonly known as:  MITIGARE Two tablets today, then 1 tablet daily   esomeprazole 40 MG capsule Commonly known as:  NEXIUM TAKE 1 CAPSULE DAILY   fish oil-omega-3 fatty acids 1000 MG capsule Take 1,200 mg by mouth daily.   Icosapent Ethyl 1 g Caps Commonly known as:  VASCEPA Take 1 tablet by mouth daily.   midodrine 5 MG tablet Commonly known as:  PROAMATINE Take 1 tablet (5 mg total) by mouth 2 (two) times daily with a meal.   multivitamin tablet Take 1 tablet by mouth daily.   nitroGLYCERIN 0.4 MG SL tablet Commonly known as:  NITROSTAT Place 1 tablet (0.4 mg total) under the tongue every 5 (five) minutes as needed for chest pain.   rosuvastatin 10 MG tablet Commonly known as:  CRESTOR Take 1 tablet (10 mg total) by mouth daily.   sodium bicarbonate 650 MG tablet Take 650 mg by mouth 2 (two) times daily.   Vitamin D (Ergocalciferol) 50000 units Caps capsule Commonly known as:  DRISDOL TAKE 1 CAPSULE ONCE A WEEK   WELCHOL 625 MG tablet Generic drug:  colesevelam TAKE 3 TABLETS TWICE A DAY        DISCHARGE INSTRUCTIONS:  Discharge to home Follow up primary care provider, cardiologist, nephrologist and urologist. DIET:  Cardiac diet DISCHARGE CONDITION:  Stable ACTIVITY:  Activity as tolerated OXYGEN:  Home Oxygen: No.  Oxygen Delivery: room air DISCHARGE LOCATION:  home   If you experience worsening of your admission symptoms, develop shortness of breath, life threatening emergency, suicidal or homicidal thoughts you must seek medical attention immediately by calling 911 or calling your MD immediately  if symptoms less severe.  You Must read complete instructions/literature along with all the possible adverse  reactions/side effects for all the Medicines you take and that have been prescribed to you. Take any new Medicines after you have completely understood and accpet all the possible adverse reactions/side effects.   Please note  You were cared for by a hospitalist during your hospital stay. If you have any questions about your discharge medications or the care you received while you were in the hospital after you are discharged, you can call the unit and asked to speak with the hospitalist on call if the hospitalist that took care of you is not available. Once you are discharged, your primary care physician will handle any further medical issues. Please note that NO REFILLS for any discharge medications will be authorized once you are discharged, as it is imperative that you return to your primary care physician (or establish a relationship with a primary care physician if you do not have one) for your aftercare needs so that they can reassess your need for medications and monitor your lab values.    On the day of Discharge:  VITAL SIGNS:  Blood pressure (!) 159/63, pulse 71, temperature 98 F (36.7 C), temperature source Oral, resp. rate 18, height 6\' 2"  (1.88 m), weight 81.2 kg (179 lb 1.6 oz),  SpO2 100 %. PHYSICAL EXAMINATION:  GENERAL:  82 y.o.-year-old patient lying in the bed with no acute distress.  EYES: Pupils equal, round, reactive to light and accommodation. No scleral icterus. Extraocular muscles intact.  HEENT: Head atraumatic, normocephalic. Oropharynx and nasopharynx clear.  NECK:  Supple, no jugular venous distention. No thyroid enlargement, no tenderness.  LUNGS: Normal breath sounds bilaterally, no wheezing, rales,rhonchi or crepitation. No use of accessory muscles of respiration.  CARDIOVASCULAR: S1, S2 normal. No murmurs, rubs, or gallops.  ABDOMEN: Soft, non-tender, non-distended. Bowel sounds present. No organomegaly or mass.  EXTREMITIES: No pedal edema, cyanosis, or  clubbing.  NEUROLOGIC: Cranial nerves II through XII are intact. Muscle strength 5/5 in all extremities. Sensation intact. Gait not checked.  PSYCHIATRIC: The patient is alert and oriented x 3.  SKIN: No obvious rash, lesion, or ulcer.  DATA REVIEW:   CBC  Recent Labs Lab 06/27/16 0512  WBC 6.5  HGB 9.3*  HCT 27.2*  PLT 129*    Chemistries   Recent Labs Lab 06/25/16 0815  06/27/16 0512  NA 141  < > 138  K 3.9  < > 3.7  CL 108  < > 106  CO2 27  < > 24  GLUCOSE 98  < > 101*  BUN 52*  < > 37*  CREATININE 1.81*  < > 1.74*  CALCIUM 9.3  < > 9.0  MG 1.8  --   --   < > = values in this interval not displayed.   Microbiology Results  Results for orders placed or performed during the hospital encounter of 10/10/08  Urine culture     Status: None   Collection Time: 10/09/08  6:50 PM  Result Value Ref Range Status   Specimen Description URINE, RANDOM  Final   Special Requests [id number] ON [id number] @0227   Final   Colony Count NO GROWTH  Final   Culture NO GROWTH  Final   Report Status 10/11/2008 FINAL  Final  Culture, blood (routine x 2)     Status: None   Collection Time: 10/10/08  2:35 AM  Result Value Ref Range Status   Specimen Description BLOOD RIGHT HAND  Final   Special Requests BOTTLES DRAWN AEROBIC AND ANAEROBIC 10CC EACH  Final   Culture NO GROWTH 5 DAYS  Final   Report Status 10/16/2008 FINAL  Final  Culture, blood (routine x 2)     Status: None   Collection Time: 10/10/08  2:45 AM  Result Value Ref Range Status   Specimen Description BLOOD RIGHT HAND  Final   Special Requests BOTTLES DRAWN AEROBIC AND ANAEROBIC 5CC  Final   Culture NO GROWTH 5 DAYS  Final   Report Status 10/16/2008 FINAL  Final  Clostridium difficile EIA     Status: None   Collection Time: 10/10/08  4:06 PM  Result Value Ref Range Status   Specimen Description STOOL  Final   Special Requests IMMUNE:NORM  Final   C difficile Toxins A+B, EIA NEGATIVE  Final   Report Status 10/11/2008 FINAL   Final  Fecal lactoferrin     Status: None   Collection Time: 10/10/08  4:06 PM  Result Value Ref Range Status   Specimen Description STOOL  Final   Special Requests NONE  Final   Fecal Lactoferrin POSITIVE  Final   Report Status 10/11/2008 FINAL  Final  Stool culture     Status: None   Collection Time: 10/10/08  4:06 PM  Result Value Ref Range Status  Specimen Description STOOL  Final   Special Requests IMMUNE:NORM  Final   Culture   Final    NO SALMONELLA, SHIGELLA, CAMPYLOBACTER, OR YERSINIA ISOLATED   Report Status 10/14/2008 FINAL  Final  Stool culture     Status: None   Collection Time: 10/12/08  6:47 PM  Result Value Ref Range Status   Specimen Description STOOL  Final   Special Requests NONE  Final   Culture   Final    NO SALMONELLA, SHIGELLA, CAMPYLOBACTER, OR YERSINIA ISOLATED   Report Status 10/16/2008 FINAL  Final    RADIOLOGY:  No results found.   Management plans discussed with the patient, family and they are in agreement.  CODE STATUS:     Code Status Orders        Start     Ordered   06/24/16 1555  Full code  Continuous     06/24/16 1554    Code Status History    Date Active Date Inactive Code Status Order ID Comments User Context   06/24/2016  3:36 AM 06/24/2016  3:54 PM DNR [id number]  Saundra Shelling, MD Inpatient   03/10/2016  8:37 AM 03/10/2016  2:45 PM DNR [id number]  [name], MD Inpatient   03/08/2016  4:02 PM 03/08/2016  4:21 PM Full Code [id number]  Nicholes Mango, MD Inpatient      TOTAL TIME TAKING CARE OF THIS PATIENT: 35 minutes.    Harvie Bridge M.D on 06/28/2016 at 9:43 AM  Between 7am to 6pm - Pager - [phone]  After 6pm go to [email or web address] - Proofreader  [facility]  Office  [phone]  CC: Primary care physician; [name], MD   Note: This dictation was prepared with Dragon dictation along with smaller phrase technology. Any transcriptional errors that result from this process  are unintentional.

## 2016-06-28 NOTE — Discharge Instructions (Signed)
-   Abdominal binder can be purchased at the pharmacy and used for support especially when changing positions.

## 2016-06-30 ENCOUNTER — Encounter: Payer: Self-pay | Admitting: Family Medicine

## 2016-06-30 ENCOUNTER — Telehealth: Payer: Self-pay | Admitting: Cardiology

## 2016-06-30 ENCOUNTER — Ambulatory Visit (INDEPENDENT_AMBULATORY_CARE_PROVIDER_SITE_OTHER): Payer: Medicare Other | Admitting: Family Medicine

## 2016-06-30 ENCOUNTER — Ambulatory Visit: Payer: Medicare Other | Admitting: Cardiology

## 2016-06-30 VITALS — BP 134/64 | HR 95 | Temp 98.6°F | Resp 16 | Ht 74.0 in | Wt 182.0 lb

## 2016-06-30 DIAGNOSIS — G20 Parkinson's disease: Secondary | ICD-10-CM | POA: Diagnosis not present

## 2016-06-30 DIAGNOSIS — Z8673 Personal history of transient ischemic attack (TIA), and cerebral infarction without residual deficits: Secondary | ICD-10-CM | POA: Diagnosis not present

## 2016-06-30 DIAGNOSIS — I259 Chronic ischemic heart disease, unspecified: Secondary | ICD-10-CM

## 2016-06-30 DIAGNOSIS — I2510 Atherosclerotic heart disease of native coronary artery without angina pectoris: Secondary | ICD-10-CM | POA: Diagnosis not present

## 2016-06-30 DIAGNOSIS — R319 Hematuria, unspecified: Secondary | ICD-10-CM | POA: Diagnosis not present

## 2016-06-30 DIAGNOSIS — N184 Chronic kidney disease, stage 4 (severe): Secondary | ICD-10-CM

## 2016-06-30 DIAGNOSIS — I951 Orthostatic hypotension: Secondary | ICD-10-CM | POA: Diagnosis not present

## 2016-06-30 DIAGNOSIS — D638 Anemia in other chronic diseases classified elsewhere: Secondary | ICD-10-CM

## 2016-06-30 DIAGNOSIS — I519 Heart disease, unspecified: Secondary | ICD-10-CM | POA: Diagnosis not present

## 2016-06-30 DIAGNOSIS — M109 Gout, unspecified: Secondary | ICD-10-CM

## 2016-06-30 MED ORDER — ALLOPURINOL 100 MG PO TABS: 200.0000 mg | ORAL_TABLET | Freq: Every day | ORAL | 3 refills | Status: DC

## 2016-06-30 MED ORDER — ALLOPURINOL 100 MG PO TABS: 200.0000 mg | ORAL_TABLET | Freq: Every day | ORAL | 5 refills | Status: DC

## 2016-06-30 NOTE — Telephone Encounter (Signed)
Received request from [place] [facility] to schedule patient in [facility] asap per patient having problems.  Patient was seen at [place] for syncope and Dr. Saunders Revel was one of providers.  Patient agreeable to see Dr. Saunders Revel Tuesday 07/01/16 at 3pm in [place] .

## 2016-06-30 NOTE — Progress Notes (Signed)
Patient: Keith Woods Male    DOB: 06-28-1926   82 y.o.   MRN: [id number] Visit Date: 06/30/2016  Today's Provider: [name], MD   Chief Complaint  Patient presents with  . Hospitalization Follow-up   Subjective:    HPI   Follow up Hospitalization  Patient was admitted to [facility] on 06/23/16 and discharged on 06/28/16. He was treated for; syncope and collapse due to orthostatic hypotension. Prior to admission he had been on metoprolol succinate due to CHF with systolic dysfunction, which was discontinued during hospitalization. He was given IVFluids and remained orthostatic. He was then started on Midodrine with improvement in symptoms.   He is also noted to be on carbidopa/levodopa for Parkinson's. He continues to have persistent resting tremor and family reports his gait is shuffled, but has not fallen aside from syncopal episodes.   He has long history hypertension with labile blood pressure and long history of autonomic dysfunction & orthostasis. He was on pindolol for about 4 months in 2016 but stopped due to fatigue. However fatigue is also long standing and not improved with any of his medications changes. He reports systolic BP has been mostly in the 130s since discharged, but did get a little dizzy when he stood up last night.   He has follow up with cardiology scheduled tomorrow, nephrology next week and neurology on 07-15-2016.   He also complains of two episodes of seeing blood stain on underwear. States he experiences a slight burn occasionally when urinated. He had normal u/a in the hospital when first episode occurred.   Allergies  Allergen Reactions  . Lipitor [Atorvastatin Calcium]   . Nsaids   . Statins      Current Outpatient Prescriptions:  .  allopurinol (ZYLOPRIM) 100 MG tablet, Take 2 tablets (200 mg total) by mouth daily., Disp: 60 tablet, Rfl: 5 .  aspirin EC 81 MG EC tablet, Take 1 tablet (81 mg total) by mouth daily., Disp: 30  tablet, Rfl: 0 .  b complex vitamins tablet, Take 1 tablet by mouth daily., Disp: , Rfl:  .  Carbidopa-Levodopa ER (SINEMET CR) 25-100 MG tablet controlled release, Take 1 tablet by mouth 2 (two) times daily., Disp: 60 tablet, Rfl: 0 .  citalopram (CELEXA) 10 MG tablet, Take 0.5 tablets (5 mg total) by mouth 2 (two) times daily., Disp: 90 tablet, Rfl: 4 .  Colchicine (MITIGARE) 0.6 MG CAPS, Two tablets today, then 1 tablet daily, Disp: 30 capsule, Rfl: 3 .  midodrine (PROAMATINE) 5 MG tablet, Take 1 tablet (5 mg total) by mouth 2 (two) times daily with a meal., Disp: 60 tablet, Rfl: 0 .  Multiple Vitamin (MULTIVITAMIN) tablet, Take 1 tablet by mouth daily.  , Disp: , Rfl:  .  nitroGLYCERIN (NITROSTAT) 0.4 MG SL tablet, Place 1 tablet (0.4 mg total) under the tongue every 5 (five) minutes as needed for chest pain., Disp: 100 tablet, Rfl: 3 .  Probiotic Product (ALIGN) 4 MG CAPS, Take 4 mg by mouth daily., Disp: , Rfl:  .  rosuvastatin (CRESTOR) 10 MG tablet, Take 1 tablet (10 mg total) by mouth daily., Disp: 30 tablet, Rfl: 5 .  sodium bicarbonate 650 MG tablet, Take 650 mg by mouth 2 (two) times daily., Disp: , Rfl: 0 .  Vitamin D, Ergocalciferol, (DRISDOL) 50000 units CAPS capsule, TAKE 1 CAPSULE ONCE A WEEK, Disp: 12 capsule, Rfl: 4  Review of Systems  Constitutional: Positive for fatigue. Negative for appetite change, chills  and fever.  Respiratory: Negative for chest tightness, shortness of breath and wheezing.   Cardiovascular: Negative for chest pain and palpitations.  Gastrointestinal: Negative for abdominal pain, nausea and vomiting.    Social History  Substance Use Topics  . Smoking status: Never Smoker  . Smokeless tobacco: Never Used  . Alcohol use No   Objective:   BP 134/64 (BP Location: Left Arm, Patient Position: Sitting, Cuff Size: Normal)   Pulse 95   Temp 98.6 F (37 C) (Oral)   Resp 16   Ht 6\' 2"  (1.88 m)   Wt 182 lb (82.6 kg)   SpO2 98%   BMI 23.37 kg/m    Physical Exam   General Appearance:    Alert, cooperative, no distress  Eyes:    PERRL, conjunctiva/corneas clear, EOM's intact       Lungs:     Clear to auscultation bilaterally, respirations unlabored  Heart:    Regular rate and rhythm  Neurologic:   Awake, alert, oriented x 3. No apparent focal neurological           defect.           Assessment & Plan:      1. Orthostatic hypotension Extensive discussion regarding causes of orthostatic hypotension. Family reports that he drinks very little fluids and counseled extensively on importance of adequate fluid intake no maintain consistent blood pressure when changing positions. He agrees to try to increase fluid intake. Discussed that symptoms may be attenuated by midodrine prescribed during, but will need to keep close check on BP especially with history of severe systolic dysfunction and hemorrhagic CVA.  Also discussed potential for carbidopa/levodopa to cause orthostasis, which he is to discuss further wilt Dr. Manuella Ghazi at his upcoming visit.  Counseled on benefits of betablockers and ACEI for CHF, but that these medications may exacerbate orthostasis, which makes it even more important to keep adequately hydrated and change positions slowly and cautiously. He will discuss further with cardiology tomorrow.   2. Acute gout of right hand, unspecified cause Has been well controlled on allopurinol - allopurinol (ZYLOPRIM) 100 MG tablet; Take 2 tablets (200 mg total) by mouth daily.  Dispense: 180 tablet; Refill: 3  3. Hematuria, unspecified type Has had two episodes of blood stain on underwear since hospitalization, but no other indications of infections. He is to call if this occurs more persistently.   4. Severe left ventricular systolic dysfunction Unfortunately had to be taken off betablocker due to orthostasis  5. Coronary artery disease involving native coronary artery of native heart without angina pectoris No active chest pain.  Tolerating rosuvastatin.   6. Parkinson's disease ([place]) Minimal improvement on Sinemet. To follow up in December with Dr. Manuella Ghazi.   7. Stage 4 chronic kidney disease ([place]) Follow up nephrology as scheduled.   8. History of CVA (cerebrovascular accident)  9. Anemia of chronic disease.  Normal ferritin July 2017.       [name], MD  [facility]

## 2016-06-30 NOTE — Telephone Encounter (Signed)
Pt seen in hospital for syncope and collapse , was told to fu with Arida in [place] then told to see [name] next week, told him he was in next week, I could get him in with the PA-he refused-wants to get in with [place]

## 2016-06-30 NOTE — Patient Instructions (Signed)
   Drink 8 8 ounce glasses or water a day   No more than 2 gram of sodium per day.

## 2016-06-30 NOTE — Telephone Encounter (Signed)
Returned call to patient this morning spoke to wife.She stated patient was recently in hospital at [facility].Stated he was told to schedule post hospital appt with Dr.Jordan.Stated he would like to change care to our [place] office since it is closer.She requested to see Dr.Arida. Spoke to [place] he advised ok.Spoke to [place] office they call and schedule pt appt.

## 2016-07-01 ENCOUNTER — Encounter: Payer: Self-pay | Admitting: Internal Medicine

## 2016-07-01 ENCOUNTER — Ambulatory Visit (INDEPENDENT_AMBULATORY_CARE_PROVIDER_SITE_OTHER): Payer: Medicare Other | Admitting: Internal Medicine

## 2016-07-01 VITALS — HR 83 | Ht 74.0 in | Wt 181.8 lb

## 2016-07-01 DIAGNOSIS — I951 Orthostatic hypotension: Secondary | ICD-10-CM

## 2016-07-01 DIAGNOSIS — R55 Syncope and collapse: Secondary | ICD-10-CM

## 2016-07-01 DIAGNOSIS — I447 Left bundle-branch block, unspecified: Secondary | ICD-10-CM

## 2016-07-01 DIAGNOSIS — I519 Heart disease, unspecified: Secondary | ICD-10-CM | POA: Diagnosis not present

## 2016-07-01 DIAGNOSIS — I259 Chronic ischemic heart disease, unspecified: Secondary | ICD-10-CM | POA: Diagnosis not present

## 2016-07-01 DIAGNOSIS — I255 Ischemic cardiomyopathy: Secondary | ICD-10-CM | POA: Diagnosis not present

## 2016-07-01 DIAGNOSIS — I2510 Atherosclerotic heart disease of native coronary artery without angina pectoris: Secondary | ICD-10-CM

## 2016-07-01 NOTE — Progress Notes (Signed)
Follow-up Outpatient Visit Date: 07/01/2016  Chief Complaint: Hospital follow-up  HPI:  Keith Woods is a 82 y.o. year-old male with history of chronic systolic heart failure with severely reduced LVEF (presumed ischemic based on abnormal myocardial perfusion stress test in 03/2016), stroke, hypertension, hyperlipidemia, recurrent syncope, chronic kidney disease stage III, and Parkinson's symptoms, who presents for follow-up after recent hospitalization for syncope. The patient was admitted to [facility] on 06/24/16 after being found unresponsive on the toilet in the middle the night by his wife. Upon arrival in the hospital, the patient was noted to have significant orthostatic hypotension. He was gently hydrated with down titration and ultimate discontinuation of metoprolol. He continued to have significant orthostatic hypotension, prompting addition of midodrine. Repeat echocardiogram during his hospitalization demonstrated persistent severe LV dysfunction with an EF of 25-30%. He was discharged home on 06/28/16.  Since leaving the hospital, Mr. Duty has not had any further episodes of syncope or near syncope. His primary complaint is of feeling very fatigued. Even modest activity such as taking a shower wears him out. He has felt that way since his stroke in 02/2016. He occasionally feels lightheaded when first standing up. He denies palpitations, chest pain, shortness of breath, orthopnea, PND, and leg edema. There has been concern in the past with poor oral intake, though the patient reports he has been trying to drink at least 6 glasses of water per day. However, his wife disputes this and is concerned that he continues to drink less than he should.  The patient checks his blood pressure regularly at home and notes that it fluctuates. He has was previously instructed to take amlodipine 2.5 mg if his systolic blood pressure is above 130 to 140 mmHg. He has continued to do this since leaving the hospital,  most recently having taken a dose yesterday.  --------------------------------------------------------------------------------------------------  Cardiovascular History & Procedures: Cardiovascular Problems:  Severe systolic heart failure (presumed ischemic)  Stroke  Orthostatic hypotension  Risk Factors:  Known CAD, hypertension, hyperlipidemia, age greater than 63, and male gender  Cath/PCI:  LHC (08/28/06): LMCA normal. LAD with mild luminal irregularities. Moderate sized ramus with mild luminal irregularities. Codominant LCx with mild luminal irregularities. Small moderate sized codominant RCA with mild luminal irregularities. LVEF 60%.  CV Surgery:  None  EP Procedures and Devices:  None  Non-Invasive Evaluation(s):  Transthoracic echocardiogram (03/08/16): Normal LV size with severely reduced contraction (EF 25-30%) with diffuse hypokinesis. Grade 1 diastolic dysfunction. Mild MR. Normal RV size and function. Trivial TR.  Pharmacologic myocardial perfusion stress test (04/10/16): Large, severe inferior and apical defect consistent with prior infarction. No evidence of ischemia. LVEF 26%.  Limited echo (06/24/16): Normal LV size and wall thickness with LVEF of 25-30%. Mild aortic regurgitation. Normal RV size and function.  Recent CV Pertinent Labs: Lab Results  Component Value Date   CHOL 142 06/05/2016   HDL 46 06/05/2016   LDLCALC 72 06/05/2016   TRIG 118 06/05/2016   CHOLHDL 3.1 06/05/2016   CHOLHDL 5.2 03/09/2016   INR 1.02 03/08/2016   K 3.7 06/27/2016   MG 1.8 06/25/2016   BUN 37 (H) 06/27/2016   BUN 26 02/05/2016   CREATININE 1.74 (H) 06/27/2016    Past medical and surgical history were reviewed and updated in EPIC.   Outpatient Encounter Prescriptions as of 07/01/2016  Medication Sig  . allopurinol (ZYLOPRIM) 100 MG tablet Take 2 tablets (200 mg total) by mouth daily.  Marland Kitchen amLODipine (NORVASC) 5 MG tablet Take 5 mg  by mouth daily. Takes 1 tablet by  mouth as needed for SBP>180.  Marland Kitchen aspirin EC 81 MG EC tablet Take 1 tablet (81 mg total) by mouth daily.  Marland Kitchen b complex vitamins tablet Take 1 tablet by mouth daily.  . Carbidopa-Levodopa ER (SINEMET CR) 25-100 MG tablet controlled release Take 1 tablet by mouth 2 (two) times daily.  . citalopram (CELEXA) 10 MG tablet Take 0.5 tablets (5 mg total) by mouth 2 (two) times daily.  . Colchicine (MITIGARE) 0.6 MG CAPS Two tablets today, then 1 tablet daily  . midodrine (PROAMATINE) 5 MG tablet Take 1 tablet (5 mg total) by mouth 2 (two) times daily with a meal.  . Multiple Vitamin (MULTIVITAMIN) tablet Take 1 tablet by mouth daily.    . nitroGLYCERIN (NITROSTAT) 0.4 MG SL tablet Place 1 tablet (0.4 mg total) under the tongue every 5 (five) minutes as needed for chest pain.  . Probiotic Product (ALIGN) 4 MG CAPS Take 4 mg by mouth daily.  . rosuvastatin (CRESTOR) 10 MG tablet Take 1 tablet (10 mg total) by mouth daily.  . sodium bicarbonate 650 MG tablet Take 650 mg by mouth 2 (two) times daily.  . Vitamin D, Ergocalciferol, (DRISDOL) 50000 units CAPS capsule TAKE 1 CAPSULE ONCE A WEEK   No facility-administered encounter medications on file as of 07/01/2016.     Allergies: Lipitor [atorvastatin calcium]; Nsaids; and Statins  Social History   Social History  . Marital status: Married    Spouse name: N/A  . Number of children: 3  . Years of education: N/A   Occupational History  . minister-retired    Social History Main Topics  . Smoking status: Never Smoker  . Smokeless tobacco: Never Used  . Alcohol use No  . Drug use: No  . Sexual activity: Not on file   Other Topics Concern  . Not on file   Social History Narrative  . No narrative on file    Family History  Problem Relation Age of Onset  . Ovarian cancer Mother   . Prostate cancer Father     Review of Systems: A 12-system review of systems was performed and was negative except as noted in the  HPI.  --------------------------------------------------------------------------------------------------  Physical Exam: Pulse 83   Ht 6\' 2"  (1.88 m)   Wt 181 lb 12 oz (82.4 kg)   BMI 23.34 kg/m   General:  Frail, elderly man seated comfortably in the exam room. He is accompanied by his wife and daughter. HEENT: No conjunctival pallor or scleral icterus.  Dry mucous membranes.  OP clear. Neck: Supple without lymphadenopathy, thyromegaly, JVD, or HJR.  No carotid bruit. Lungs: Normal work of breathing.  Clear to auscultation bilaterally without wheezes or crackles. Heart: Distant heart sounds. Regular rate and rhythm with rare extrasystoles. No murmurs, rubs, or gallops. Abd: Bowel sounds present.  Soft, NT/ND without hepatosplenomegaly Ext: No lower extremity edema.  Radial, PT, and DP pulses are 2+ bilaterally. Skin: warm and dry without rash  EKG:  Normal sinus rhythm with left axis deviation and left bundle branch block. Compared with the prior tracing from 06/23/16, PACs and PVCs are no longer evident (I have personally reviewed both tracings).  Lab Results  Component Value Date   WBC 6.5 06/27/2016   HGB 9.3 (L) 06/27/2016   HCT 27.2 (L) 06/27/2016   MCV 97.0 06/27/2016   PLT 129 (L) 06/27/2016    Lab Results  Component Value Date   NA 138 06/27/2016  K 3.7 06/27/2016   CL 106 06/27/2016   CO2 24 06/27/2016   BUN 37 (H) 06/27/2016   CREATININE 1.74 (H) 06/27/2016   GLUCOSE 101 (H) 06/27/2016   ALT 18 06/05/2016    Lab Results  Component Value Date   CHOL 142 06/05/2016   HDL 46 06/05/2016   LDLCALC 72 06/05/2016   TRIG 118 06/05/2016   CHOLHDL 3.1 06/05/2016   --------------------------------------------------------------------------------------------------  ASSESSMENT AND PLAN: Chronic systolic heart failure This is likely ischemic in etiology based on his stress test results, though his echocardiogram demonstrates persistent global hypokinesis that has  not improved since late July. Unfortunately, the patient is not on any evidence based heart failure therapy secondary to recurrent syncope and orthostatic hypotension. He is again noted to have orthostatic hypotension today. It should be noted, however, that the patient intermittently continues to take amlodipine 2.5 mg if his systolic blood pressures greater than 140 mmHg. Mr. Estella, his family, and I spent 30 minutes discussing the natural history of cardiomyopathy and severe systolic heart failure and the rationale behind medical therapy as well as revascularization. Currently, the patient is NYHA class III but appears euvolemic to dry. I have encouraged him to increase his fluid intake by at least two 8 ounce glasses of water per day. Given that he continues to have positive orthostatic vital signs, we will not add any medications today. I have asked him to refrain from taking amlodipine unless he has marked hypertension (systolic blood pressure greater than 180 mmHg). He should also contact our office at that time for further instructions. We will have the patient return in about 2 weeks for reevaluation, at which time we will consider reinitiation of low-dose metoprolol. It should be noted that he was intolerant of carvedilol in the past due to hypotension. I also think that he would have difficulties with an ACE inhibitor and/or spironolactone given his soft blood pressures and renal insufficiency. We readdressed performing coronary angiography to define his coronary anatomy but have agreed to defer this due to his renal insufficiency and desire to avoid any nephrotoxic agents that could push him towards dialysis.  Recurrent syncope and orthostatic hypotension Several of the patient's syncopal episodes, including his most recent one earlier this month, occurred in the setting of using the toilet. It is possible that he is having recurrent vasovagal events that are exacerbated by autonomic dysfunction. He  is currently on midodrine, which he seems to be tolerating well. We will continue this for the time being. I have also encouraged him to increase his fluid intake, as above. Given his severe LV dysfunction with presumed ischemic etiology, ventricular arrhythmia is also a possibility. Given his age, frailty, and poor long-term prognosis portended by inability to tolerate any evidence based heart failure therapy, we will defer consideration of CRT/ICD placement. Given his multiple syncopal episodes, most recently within the last 2 weeks, I have advised the patient to refrain from driving.  Follow-up: Return to clinic in 2 weeks.  I spent more than 40 minutes interviewing, examining, and counseling the patient.  Greater than 50% of the time was spent face-to-face with the patient and his family.  Nelva Bush, MD 07/02/2016 12:57 PM

## 2016-07-01 NOTE — Patient Instructions (Signed)
Medication Instructions:  Your physician recommends that you continue on your current medications as directed. Please refer to the Current Medication list given to you today.   Follow-Up: Your physician recommends that you schedule a follow-up appointment in: 2 WEEKS WITH DR END.   Your physician has requested that you regularly monitor and record your blood pressure readings at home. Please use the same machine at the same time of day to check your readings and record them to bring to your follow-up visit.   If you need a refill on your cardiac medications before your next appointment, please call your pharmacy.

## 2016-07-02 ENCOUNTER — Encounter: Payer: Self-pay | Admitting: Internal Medicine

## 2016-07-02 NOTE — Addendum Note (Signed)
Addended by: Anselm Pancoast on: 07/02/2016 03:58 PM   Modules accepted: Orders

## 2016-07-04 ENCOUNTER — Other Ambulatory Visit: Payer: Self-pay | Admitting: Family Medicine

## 2016-07-04 DIAGNOSIS — M109 Gout, unspecified: Secondary | ICD-10-CM

## 2016-07-08 DIAGNOSIS — M109 Gout, unspecified: Secondary | ICD-10-CM | POA: Diagnosis not present

## 2016-07-08 DIAGNOSIS — N184 Chronic kidney disease, stage 4 (severe): Secondary | ICD-10-CM | POA: Diagnosis not present

## 2016-07-08 DIAGNOSIS — E872 Acidosis: Secondary | ICD-10-CM | POA: Diagnosis not present

## 2016-07-08 DIAGNOSIS — G909 Disorder of the autonomic nervous system, unspecified: Secondary | ICD-10-CM | POA: Diagnosis not present

## 2016-07-15 DIAGNOSIS — G20 Parkinson's disease: Secondary | ICD-10-CM | POA: Diagnosis not present

## 2016-07-15 DIAGNOSIS — G903 Multi-system degeneration of the autonomic nervous system: Secondary | ICD-10-CM | POA: Diagnosis not present

## 2016-07-16 ENCOUNTER — Encounter: Payer: Medicare Other | Admitting: Internal Medicine

## 2016-07-16 NOTE — Progress Notes (Signed)
Erroneous encounter

## 2016-07-22 ENCOUNTER — Ambulatory Visit (INDEPENDENT_AMBULATORY_CARE_PROVIDER_SITE_OTHER): Payer: Medicare Other | Admitting: Internal Medicine

## 2016-07-22 ENCOUNTER — Encounter: Payer: Self-pay | Admitting: Internal Medicine

## 2016-07-22 VITALS — BP 142/74 | HR 82 | Ht 74.0 in | Wt 179.2 lb

## 2016-07-22 DIAGNOSIS — I2510 Atherosclerotic heart disease of native coronary artery without angina pectoris: Secondary | ICD-10-CM

## 2016-07-22 DIAGNOSIS — I5022 Chronic systolic (congestive) heart failure: Secondary | ICD-10-CM | POA: Diagnosis not present

## 2016-07-22 DIAGNOSIS — R42 Dizziness and giddiness: Secondary | ICD-10-CM | POA: Diagnosis not present

## 2016-07-22 DIAGNOSIS — I259 Chronic ischemic heart disease, unspecified: Secondary | ICD-10-CM | POA: Diagnosis not present

## 2016-07-22 DIAGNOSIS — I255 Ischemic cardiomyopathy: Secondary | ICD-10-CM | POA: Diagnosis not present

## 2016-07-22 MED ORDER — MIDODRINE HCL 5 MG PO TABS: 5.0000 mg | ORAL_TABLET | Freq: Three times a day (TID) | ORAL | 2 refills | Status: DC

## 2016-07-22 NOTE — Progress Notes (Deleted)
Follow-up Outpatient Visit Date: 07/22/16  Chief Complaint: Hospital follow-up  HPI:  [name] is a 82 y.o. year-old male with history of chronic systolic heart failure with severely reduced LVEF (presumed ischemic based on abnormal myocardial perfusion stress test in 03/2016), stroke, hypertension, hyperlipidemia, recurrent syncope, chronic kidney disease stage III, and Parkinson's symptoms, who presents for follow-up of recurrent lightheadedness/syncope with orthostatic hypotension. The patient was admitted to [facility] on 06/24/16 after being found unresponsive on the toilet in the middle the night by his wife. Upon arrival in the hospital, the patient was noted to have significant orthostatic hypotension. He was gently hydrated with down titration and ultimate discontinuation of metoprolol. He continued to have significant orthostatic hypotension, prompting addition of midodrine. Repeat echocardiogram during his hospitalization demonstrated persistent severe LV dysfunction with an EF of 25-30%. He was discharged home on 06/28/16. I saw him for follow-up 2 weeks ago, which time he continued to have significant fatigue. He had not had any further syncopal episodes.  BP still dropping from sitting to standing, up to 20 mmHg.  Does not believe SBP has been less than 100.  Still dizzy when stands up too quickly.  No syncope or falls.  No edema.  Drinking 6 8 oz beverages.  No chest pain.  No shortness of breath or orthopnea but sometimes feels like he needs to take a deep breath.  --------------------------------------------------------------------------------------------------  Cardiovascular History & Procedures: Cardiovascular Problems:  Severe systolic heart failure (presumed ischemic)  Stroke  Orthostatic hypotension  Risk Factors:  Known CAD, hypertension, hyperlipidemia, age greater than 2, and male gender  Cath/PCI:  LHC (08/28/06): LMCA normal. LAD with mild luminal irregularities.  Moderate sized ramus with mild luminal irregularities. Codominant LCx with mild luminal irregularities. Small moderate sized codominant RCA with mild luminal irregularities. LVEF 60%.  CV Surgery:  None  EP Procedures and Devices:  None  Non-Invasive Evaluation(s):  Transthoracic echocardiogram (03/08/16): Normal LV size with severely reduced contraction (EF 25-30%) with diffuse hypokinesis. Grade 1 diastolic dysfunction. Mild MR. Normal RV size and function. Trivial TR.  Pharmacologic myocardial perfusion stress test (04/10/16): Large, severe inferior and apical defect consistent with prior infarction. No evidence of ischemia. LVEF 26%.  Limited echo (06/24/16): Normal LV size and wall thickness with LVEF of 25-30%. Mild aortic regurgitation. Normal RV size and function.  Recent CV Pertinent Labs: Lab Results  Component Value Date   CHOL 142 06/05/2016   HDL 46 06/05/2016   LDLCALC 72 06/05/2016   TRIG 118 06/05/2016   CHOLHDL 3.1 06/05/2016   CHOLHDL 5.2 03/09/2016   INR 1.02 03/08/2016   K 3.7 06/27/2016   MG 1.8 06/25/2016   BUN 37 (H) 06/27/2016   BUN 26 02/05/2016   CREATININE 1.74 (H) 06/27/2016    Past medical and surgical history were reviewed and updated in EPIC.   Outpatient Encounter Prescriptions as of 07/16/2016  Medication Sig  . allopurinol (ZYLOPRIM) 100 MG tablet take 2 tablets by mouth once daily  . amLODipine (NORVASC) 2.5 MG tablet Take 2.5 mg by mouth daily as needed (SBP > 140).   Marland Kitchen aspirin EC 81 MG EC tablet Take 1 tablet (81 mg total) by mouth daily.  Marland Kitchen b complex vitamins tablet Take 1 tablet by mouth daily.  . Carbidopa-Levodopa ER (SINEMET CR) 25-100 MG tablet controlled release Take 1 tablet by mouth 2 (two) times daily.  . citalopram (CELEXA) 10 MG tablet Take 0.5 tablets (5 mg total) by mouth 2 (two) times daily.  Marland Kitchen  Colchicine (MITIGARE) 0.6 MG CAPS Two tablets today, then 1 tablet daily  . midodrine (PROAMATINE) 5 MG tablet Take 1 tablet (5  mg total) by mouth 2 (two) times daily with a meal.  . Multiple Vitamin (MULTIVITAMIN) tablet Take 1 tablet by mouth daily.    . nitroGLYCERIN (NITROSTAT) 0.4 MG SL tablet Place 1 tablet (0.4 mg total) under the tongue every 5 (five) minutes as needed for chest pain.  . Probiotic Product (ALIGN) 4 MG CAPS Take 4 mg by mouth daily.  . rosuvastatin (CRESTOR) 10 MG tablet Take 1 tablet (10 mg total) by mouth daily.  . sodium bicarbonate 650 MG tablet Take 650 mg by mouth 2 (two) times daily.  . Vitamin D, Ergocalciferol, (DRISDOL) 50000 units CAPS capsule TAKE 1 CAPSULE ONCE A WEEK   No facility-administered encounter medications on file as of 07/16/2016.     Allergies: Lipitor [atorvastatin calcium]; Nsaids; and Statins  Social History   Social History  . Marital status: Married    Spouse name: N/A  . Number of children: 3  . Years of education: N/A   Occupational History  . minister-retired    Social History Main Topics  . Smoking status: Never Smoker  . Smokeless tobacco: Never Used  . Alcohol use No  . Drug use: No  . Sexual activity: Not on file   Other Topics Concern  . Not on file   Social History Narrative  . No narrative on file    Family History  Problem Relation Age of Onset  . Ovarian cancer Mother   . Prostate cancer Father     Review of Systems: A 12-system review of systems was performed and was negative except as noted in the HPI.  --------------------------------------------------------------------------------------------------  Physical Exam: There were no vitals taken for this visit.  General:  Frail, elderly man seated comfortably in the exam room. He is accompanied by his wife and daughter. HEENT: No conjunctival pallor or scleral icterus.  Dry mucous membranes.  OP clear. Neck: Supple without lymphadenopathy, thyromegaly, JVD, or HJR.  No carotid bruit. Lungs: Normal work of breathing.  Clear to auscultation bilaterally without wheezes or  crackles. Heart: Distant heart sounds. Regular rate and rhythm with rare extrasystoles. No murmurs, rubs, or gallops. Abd: Bowel sounds present.  Soft, NT/ND without hepatosplenomegaly Ext: No lower extremity edema.  Radial, PT, and DP pulses are 2+ bilaterally. Skin: warm and dry without rash  EKG:  Normal sinus rhythm with left axis deviation and left bundle branch block. Compared with the prior tracing from 06/23/16, PACs and PVCs are no longer evident (I have personally reviewed both tracings).  Lab Results  Component Value Date   WBC 6.5 06/27/2016   HGB 9.3 (L) 06/27/2016   HCT 27.2 (L) 06/27/2016   MCV 97.0 06/27/2016   PLT 129 (L) 06/27/2016    Lab Results  Component Value Date   NA 138 06/27/2016   K 3.7 06/27/2016   CL 106 06/27/2016   CO2 24 06/27/2016   BUN 37 (H) 06/27/2016   CREATININE 1.74 (H) 06/27/2016   GLUCOSE 101 (H) 06/27/2016   ALT 18 06/05/2016    Lab Results  Component Value Date   CHOL 142 06/05/2016   HDL 46 06/05/2016   LDLCALC 72 06/05/2016   TRIG 118 06/05/2016   CHOLHDL 3.1 06/05/2016   --------------------------------------------------------------------------------------------------  ASSESSMENT AND PLAN: Chronic systolic heart failure ***  Recurrent syncope and orthostatic hypotension ***  Follow-up: ***  Nelva Bush, MD 07/16/2016  7:45 AM

## 2016-07-22 NOTE — Patient Instructions (Addendum)
Medication Instructions:  Your physician recommends that you continue on your current medications as directed. Please refer to the Current Medication list given to you today.   Follow-Up: Your physician recommends that you schedule a follow-up appointment in: 3-4 WEEKS WITH DR END. (EARLY January).  Dr End has ordered to thigh high compression stockings at medium compression. You may take the prescription to the medical supply store of your choice.  Dr End would like you to drink 8-10 glasses of water per day.    If you need a refill on your cardiac medications before your next appointment, please call your pharmacy.

## 2016-07-22 NOTE — Progress Notes (Signed)
Follow-up Outpatient Visit Date: 07/22/2016  Chief Complaint: Follow-up fatigue and lightheadedness  HPI:  [name] is a 82 y.o. year-old male with history of chronic systolic heart failure with severely reduced LVEF (presumed ischemic based on abnormal myocardial perfusion stress test in 03/2016), stroke, hypertension, hyperlipidemia, recurrent syncope, chronic kidney disease stage III, and Parkinson's symptoms, who presents for follow-up of recurrent lightheadedness/syncope with orthostatic hypotension. The patient was admitted to [facility] on 06/24/16 after being found unresponsive on the toilet in the middle the night by his wife. Upon arrival in the hospital, the patient was noted to have significant orthostatic hypotension. He was gently hydrated with down titration and ultimate discontinuation of metoprolol. He continued to have significant orthostatic hypotension, prompting addition of midodrine. Repeat echocardiogram during his hospitalization demonstrated persistent severe LV dysfunction with an EF of 25-30%. He was discharged home on 06/28/16. I saw him for follow-up 3 weeks ago, at which time he continued to have significant fatigue. He had not had any further syncopal episodes.  Today, [name] reports that he has not felt any different than at our last visit. He continues to have fatigue and some orthostatic lightheadedness when he "gets up too fast." He has not had any syncopal episodes or falls. He has been monitoring his blood pressure at home and has not taken any as needed amlodipine. He denies chest pain, palpitations, orthopnea, PND, and edema. He does not have true shortness of breath but occasionally feels as though he needs to take a deep breath.  --------------------------------------------------------------------------------------------------  Cardiovascular History & Procedures: Cardiovascular Problems:  Severe systolic heart failure (presumed  ischemic)  Stroke  Orthostatic hypotension  Risk Factors:  Known CAD, hypertension, hyperlipidemia, age greater than 22, and male gender  Cath/PCI:  LHC (08/28/06): LMCA normal. LAD with mild luminal irregularities. Moderate sized ramus with mild luminal irregularities. Codominant LCx with mild luminal irregularities. Small moderate sized codominant RCA with mild luminal irregularities. LVEF 60%.  CV Surgery:  None  EP Procedures and Devices:  None  Non-Invasive Evaluation(s):  Transthoracic echocardiogram (03/08/16): Normal LV size with severely reduced contraction (EF 25-30%) with diffuse hypokinesis. Grade 1 diastolic dysfunction. Mild MR. Normal RV size and function. Trivial TR.  Pharmacologic myocardial perfusion stress test (04/10/16): Large, severe inferior and apical defect consistent with prior infarction. No evidence of ischemia. LVEF 26%.  Limited echo (06/24/16): Normal LV size and wall thickness with LVEF of 25-30%. Mild aortic regurgitation. Normal RV size and function.   Recent CV Pertinent Labs: Lab Results  Component Value Date   CHOL 142 06/05/2016   HDL 46 06/05/2016   LDLCALC 72 06/05/2016   TRIG 118 06/05/2016   CHOLHDL 3.1 06/05/2016   CHOLHDL 5.2 03/09/2016   INR 1.02 03/08/2016   K 3.7 06/27/2016   MG 1.8 06/25/2016   BUN 37 (H) 06/27/2016   BUN 26 02/05/2016   CREATININE 1.74 (H) 06/27/2016     Past medical and surgical history were reviewed and updated in EPIC.   Outpatient Encounter Prescriptions as of 07/22/2016  Medication Sig  . allopurinol (ZYLOPRIM) 100 MG tablet take 2 tablets by mouth once daily  . amLODipine (NORVASC) 2.5 MG tablet Take 2.5 mg by mouth daily as needed (SBP > 140).   Marland Kitchen aspirin EC 81 MG EC tablet Take 1 tablet (81 mg total) by mouth daily.  Marland Kitchen b complex vitamins tablet Take 1 tablet by mouth daily.  . Carbidopa-Levodopa ER (SINEMET CR) 25-100 MG tablet controlled release Take 1 tablet  by mouth 2 (two) times  daily.  . citalopram (CELEXA) 10 MG tablet Take 0.5 tablets (5 mg total) by mouth 2 (two) times daily.  . Colchicine (MITIGARE) 0.6 MG CAPS Two tablets today, then 1 tablet daily  . midodrine (PROAMATINE) 5 MG tablet Take 1 tablet (5 mg total) by mouth 3 (three) times daily with meals.  . Multiple Vitamin (MULTIVITAMIN) tablet Take 1 tablet by mouth daily.    . nitroGLYCERIN (NITROSTAT) 0.4 MG SL tablet Place 1 tablet (0.4 mg total) under the tongue every 5 (five) minutes as needed for chest pain.  . Probiotic Product (ALIGN) 4 MG CAPS Take 4 mg by mouth daily.  . rosuvastatin (CRESTOR) 10 MG tablet Take 1 tablet (10 mg total) by mouth daily.  . sodium bicarbonate 650 MG tablet Take 650 mg by mouth 2 (two) times daily.  . Vitamin D, Ergocalciferol, (DRISDOL) 50000 units CAPS capsule TAKE 1 CAPSULE ONCE A WEEK  . [DISCONTINUED] midodrine (PROAMATINE) 5 MG tablet Take 1 tablet (5 mg total) by mouth 2 (two) times daily with a meal.   No facility-administered encounter medications on file as of 07/22/2016.     Allergies: Lipitor [atorvastatin calcium]; Nsaids; and Statins  Social History   Social History  . Marital status: Married    Spouse name: N/A  . Number of children: 3  . Years of education: N/A   Occupational History  . minister-retired    Social History Main Topics  . Smoking status: Never Smoker  . Smokeless tobacco: Never Used  . Alcohol use No  . Drug use: No  . Sexual activity: Not on file   Other Topics Concern  . Not on file   Social History Narrative  . No narrative on file    Family History  Problem Relation Age of Onset  . Ovarian cancer Mother   . Prostate cancer Father     Review of Systems: A 12-system review of systems was performed and was negative except as noted in the HPI.  --------------------------------------------------------------------------------------------------  Physical Exam: BP (!) 142/74 (BP Location: Left Arm, Patient Position:  Sitting, Cuff Size: Normal)   Pulse 82   Ht 6\' 2"  (1.88 m)   Wt 179 lb 4 oz (81.3 kg)   BMI 23.01 kg/m   General:  Elderly man, seated comfortably on the exam table. He is accompanied by his daughter. HEENT: No conjunctival pallor or scleral icterus.  Moist mucous membranes.  OP clear. Neck: Supple without lymphadenopathy, thyromegaly, JVD, or HJR.  Lungs: Normal work of breathing.  Clear to auscultation bilaterally without wheezes or crackles. Heart: Regular rate and rhythm without murmurs, rubs, or gallops.  Non-displaced PMI. Abd: Bowel sounds present.  Soft, NT/ND without hepatosplenomegaly Ext: No lower extremity edema.  Radial, PT, and DP pulses are 2+ bilaterally. Skin: warm and dry without rash  EKG:  Normal sinus rhythm with left bundle branch block.  Lab Results  Component Value Date   WBC 6.5 06/27/2016   HGB 9.3 (L) 06/27/2016   HCT 27.2 (L) 06/27/2016   MCV 97.0 06/27/2016   PLT 129 (L) 06/27/2016    Lab Results  Component Value Date   NA 138 06/27/2016   K 3.7 06/27/2016   CL 106 06/27/2016   CO2 24 06/27/2016   BUN 37 (H) 06/27/2016   CREATININE 1.74 (H) 06/27/2016   GLUCOSE 101 (H) 06/27/2016   ALT 18 06/05/2016    Lab Results  Component Value Date   CHOL 142 06/05/2016  HDL 46 06/05/2016   LDLCALC 72 06/05/2016   TRIG 118 06/05/2016   CHOLHDL 3.1 06/05/2016    --------------------------------------------------------------------------------------------------  ASSESSMENT AND PLAN: Severe systolic heart failure due to presumed ischemic cardiomyopathy: The patient appears euvolemic to dry on exam today. He continues to have considerable weakness consistent with NYHA class III heart failure symptoms. Overall, his functional status has not changed significantly since our last visit. We discussed the natural history of heart failure and his poor long-term prognosis given that he has been intolerant of any evidence based heart failure therapy. Given his  age and other comorbidities including chronic kidney disease and history of recurrent falls, I do not think he is a good candidate for coronary angiography, revascularization, or CRT/ICD placement. His primary goal at this time is quality of life, and I worry that any invasive procedures could lead to complications and prolonged hospitalizations. We discussed restarting low-dose metoprolol, but given his continued orthostatic lightheadedness, we will defer this.  Coronary artery disease: No symptoms to suggest worsening coronary insufficiency. We will continue with secondary prevention including low-dose aspirin and rosuvastatin.  Orthostatic lightheadedness: This is likely multifactorial, including autonomic dysfunction superimposed on severe systolic heart failure. The patient's blood pressure is mildly elevated today, but I favor continuing his current doses of midodrine. We discussed liberalizing his fluid intake. He currently drinks about 48 ounces of water per day. I asked him to increase this to 60-80 ounces. He should continue to restrict his sodium, as he could develop fluid retention quite easily with more salt intake. I have also provided him with a prescription for thigh-high compression stockings to see if this helps his symptoms.  Follow-up: Return to clinic in 3-4 weeks.  Nelva Bush, MD 07/22/2016 9:56 PM

## 2016-07-24 ENCOUNTER — Ambulatory Visit: Payer: Medicare Other | Admitting: Family Medicine

## 2016-08-05 ENCOUNTER — Telehealth: Payer: Self-pay | Admitting: Internal Medicine

## 2016-08-05 NOTE — Telephone Encounter (Signed)
Received incoming call to Triage. Pt stated he had CP last night around 11:00 PM. Pt had 2 episodes which lasted 1-2 mins in length. Pt stated pain level 6 during episodes. Pt denies SOB or dizziness. Pt stated he did not take Nitroglycerin during episodes. BP yesterday (sitting) 149/83; HR 82. 12/24 - BP (sitting) 123/72, HR 90; 12/23 - BP (sitting) 107/69, HR 106. Pt stated he feels fine today. Dr. Saunders Revel is out of the office today. Will forward to Dr. Caryl Comes (DOD). Will call pt back with recommendation.

## 2016-08-05 NOTE — Telephone Encounter (Signed)
Called, spoke with pt. Dr. Caryl Comes stated pt should take Nitroglycerin, as directed, for chest pain. Pt needs to f/u with Dr. Saunders Revel at scheduled appt on 08/19/16. Educated pt on how/when to take Nitroglycerin. At onset of CP, place 1 sub lingual tablet under tongue. Wait 5 min. If still experiencing chest pain, repeat (take additional sub lingual tab). Wait 5 mins. If still experiencing CP, take 3rd tab and call 911 or have someone transport pt to the emergency room. Informed Nitro can drop BP. Informed pt should not drive while taking med. Pt should change positions slowly, to reduce possible orthostatic hypotension. Informed pt to call our office with further questions, concern, or symptoms. Pt verbalized understanding and agreed with plan.

## 2016-08-05 NOTE — Telephone Encounter (Signed)
Pt c/o of Chest Pain: STAT if CP now or developed within 24 hours  1. Are you having CP right now? No  2. Are you experiencing any other symptoms (ex. SOB, nausea, vomiting, sweating)?no  3. How long have you been experiencing CP? Last night   4. Is your CP continuous or coming and going? Continuous   5. Have you taken Nitroglycerin? Was about to , but then they stopped  ?

## 2016-08-07 NOTE — Telephone Encounter (Signed)
Thank you for the update.  If he has any further episode he should let us know so that we can try to see him sooner.  If the pain persists or does not improve promptly with NTG, he should call 911 for further evaluation.  Thanks.

## 2016-08-19 ENCOUNTER — Ambulatory Visit (INDEPENDENT_AMBULATORY_CARE_PROVIDER_SITE_OTHER): Payer: Medicare Other | Admitting: Internal Medicine

## 2016-08-19 ENCOUNTER — Other Ambulatory Visit: Payer: Self-pay | Admitting: *Deleted

## 2016-08-19 ENCOUNTER — Encounter: Payer: Self-pay | Admitting: Internal Medicine

## 2016-08-19 VITALS — BP 130/80 | HR 76 | Ht 72.0 in | Wt 178.5 lb

## 2016-08-19 DIAGNOSIS — R42 Dizziness and giddiness: Secondary | ICD-10-CM

## 2016-08-19 DIAGNOSIS — I2510 Atherosclerotic heart disease of native coronary artery without angina pectoris: Secondary | ICD-10-CM

## 2016-08-19 DIAGNOSIS — I5022 Chronic systolic (congestive) heart failure: Secondary | ICD-10-CM | POA: Diagnosis not present

## 2016-08-19 DIAGNOSIS — I255 Ischemic cardiomyopathy: Secondary | ICD-10-CM | POA: Diagnosis not present

## 2016-08-19 MED ORDER — METOPROLOL SUCCINATE ER 25 MG PO TB24: 25.0000 mg | ORAL_TABLET | Freq: Every day | ORAL | 3 refills | Status: DC

## 2016-08-19 NOTE — Patient Instructions (Signed)
Medication Instructions:  Your physician has recommended you make the following change in your medication:  START taking metoprolol 25mg  once daily   Labwork: none  Testing/Procedures: none  Follow-Up: Your physician recommends that you schedule a follow-up appointment in: 1 month with Dr. Saunders Revel.    Any Other Special Instructions Will Be Listed Below (If Applicable). We have placed a Home Health referral. Someone from their office will call.     If you need a refill on your cardiac medications before your next appointment, please call your pharmacy.

## 2016-08-19 NOTE — Progress Notes (Signed)
Follow-up Outpatient Visit Date: 08/19/2016  Chief Complaint: Follow-up fatigue and lightheadedness  HPI:  Keith Woods is a 83 y.o. year-old male with history of chronic systolic heart failure with severely reduced LVEF (presumed ischemic based on abnormal myocardial perfusion stress test in 03/2016), stroke, hypertension, hyperlipidemia, recurrent syncope, chronic kidney disease stage III, and Parkinson's symptoms, who presents for follow-up of recurrent lightheadedness/syncope with orthostatic hypotension. The patient was admitted to [facility] on 06/24/16 after being found unresponsive on the toilet in the middle the night by his wife. Upon arrival in the hospital, the patient was noted to have significant orthostatic hypotension. He was gently hydrated with down titration and ultimate discontinuation of metoprolol. He continued to have significant orthostatic hypotension, prompting addition of midodrine. Repeat echocardiogram during his hospitalization demonstrated persistent severe LV dysfunction with an EF of 25-30%. Since leaving the hospital, he has struggled with fatigue and occasional lightheadedness.  I last saw him on 07/22/16. Today, he reports that he has been feeling about the same with continued fatigue and intermittent dizziness. He has fallen 3 times in the past week due to "balance being off." He continues to check his blood pressures at home and noted some orthostatic drops, with systolic pressures as low as 100 mmHg. At times, his systolic blood pressure will be as high as 180 mmHg lying or sitting. He has been trying to increase his fluid intake but still only drinks 3-4 glasses of water per day. He has been wearing compression stockings on a regular basis but finds it difficult to put on.  The patient also reports two brief episodes of chest pain shortly after Christmas. At the time, he was lying in bed and watching television. He developed a sharp pain over the lower chest that lasted about  2-3 minutes before resolving spontaneously. The pain recurred a few minutes later and again disappeared on its own after 2-3 minutes. He denies associated symptoms including shortness of breath and nausea. The pain does not radiate. He has noted some discomfort in his left shoulder since falling earlier this week. The left shoulder is tender to palpation; the pain does not worsen with exertion. Keith Woods has not needed to take nitroglycerin.  Keith Woods family is concerned about his overall weakness and difficulty doing basic tasks such as taking a shower. The patient asks about whether or not it is safe for him to drive.  --------------------------------------------------------------------------------------------------  Cardiovascular History & Procedures: Cardiovascular Problems:  Severe systolic heart failure (presumed ischemic)  Stroke  Orthostatic hypotension  Risk Factors:  Known CAD, hypertension, hyperlipidemia, age greater than 42, and male gender  Cath/PCI:  LHC (08/28/06): LMCA normal. LAD with mild luminal irregularities. Moderate sized ramus with mild luminal irregularities. Codominant LCx with mild luminal irregularities. Small moderate sized codominant RCA with mild luminal irregularities. LVEF 60%.  CV Surgery:  None  EP Procedures and Devices:  None  Non-Invasive Evaluation(s):  Transthoracic echocardiogram (03/08/16): Normal LV size with severely reduced contraction (EF 25-30%) with diffuse hypokinesis. Grade 1 diastolic dysfunction. Mild MR. Normal RV size and function. Trivial TR.  Pharmacologic myocardial perfusion stress test (04/10/16): Large, severe inferior and apical defect consistent with prior infarction. No evidence of ischemia. LVEF 26%.  Limited echo (06/24/16): Normal LV size and wall thickness with LVEF of 25-30%. Mild aortic regurgitation. Normal RV size and function.   Recent CV Pertinent Labs: Lab Results  Component Value Date   CHOL  142 06/05/2016   HDL 46 06/05/2016   LDLCALC 72  06/05/2016   TRIG 118 06/05/2016   CHOLHDL 3.1 06/05/2016   CHOLHDL 5.2 03/09/2016   INR 1.02 03/08/2016   K 3.7 06/27/2016   MG 1.8 06/25/2016   BUN 37 (H) 06/27/2016   BUN 26 02/05/2016   CREATININE 1.74 (H) 06/27/2016    Past medical and surgical history were reviewed and updated in EPIC.   Outpatient Encounter Prescriptions as of 08/19/2016  Medication Sig  . allopurinol (ZYLOPRIM) 100 MG tablet take 2 tablets by mouth once daily  . amLODipine (NORVASC) 2.5 MG tablet Take 2.5 mg by mouth daily as needed (SBP > 140).   Marland Kitchen aspirin EC 81 MG EC tablet Take 1 tablet (81 mg total) by mouth daily.  Marland Kitchen b complex vitamins tablet Take 1 tablet by mouth daily.  . Carbidopa-Levodopa ER (SINEMET CR) 25-100 MG tablet controlled release Take 1 tablet by mouth 2 (two) times daily.  . citalopram (CELEXA) 10 MG tablet Take 0.5 tablets (5 mg total) by mouth 2 (two) times daily.  . Colchicine (MITIGARE) 0.6 MG CAPS Two tablets today, then 1 tablet daily  . midodrine (PROAMATINE) 5 MG tablet Take 1 tablet (5 mg total) by mouth 3 (three) times daily with meals.  . Multiple Vitamin (MULTIVITAMIN) tablet Take 1 tablet by mouth daily.    . nitroGLYCERIN (NITROSTAT) 0.4 MG SL tablet Place 1 tablet (0.4 mg total) under the tongue every 5 (five) minutes as needed for chest pain.  . Probiotic Product (ALIGN) 4 MG CAPS Take 4 mg by mouth daily.  . rosuvastatin (CRESTOR) 10 MG tablet Take 1 tablet (10 mg total) by mouth daily.  . sodium bicarbonate 650 MG tablet Take 650 mg by mouth 2 (two) times daily.  . Vitamin D, Ergocalciferol, (DRISDOL) 50000 units CAPS capsule TAKE 1 CAPSULE ONCE A WEEK   No facility-administered encounter medications on file as of 08/19/2016.     Allergies: Lipitor [atorvastatin calcium]; Nsaids; and Statins  Social History   Social History  . Marital status: Married    Spouse name: N/A  . Number of children: 3  . Years of  education: N/A   Occupational History  . minister-retired    Social History Main Topics  . Smoking status: Never Smoker  . Smokeless tobacco: Never Used  . Alcohol use No  . Drug use: No  . Sexual activity: Not on file   Other Topics Concern  . Not on file   Social History Narrative  . No narrative on file    Family History  Problem Relation Age of Onset  . Ovarian cancer Mother   . Prostate cancer Father     Review of Systems: A 12-system review of systems was performed and was negative except as noted in the HPI.  --------------------------------------------------------------------------------------------------  Physical Exam: BP 130/80 (BP Location: Left Arm, Patient Position: Sitting, Cuff Size: Normal)   Pulse 76   Ht 6' (1.829 m)   Wt 178 lb 8 oz (81 kg)   BMI 24.21 kg/m   General:  Elderly man, seated comfortably on the exam table. He is accompanied by his Wife and daughter. HEENT: No conjunctival pallor or scleral icterus.  Moist mucous membranes.  OP clear. Neck: Supple without lymphadenopathy, thyromegaly, JVD, or HJR.  Lungs: Normal work of breathing.  Clear to auscultation bilaterally without wheezes or crackles. Heart: Distant heart sounds. Regular rate and rhythm without murmurs, rubs, or gallops.  Non-displaced PMI. Abd: Bowel sounds present.  Soft, NT/ND without hepatosplenomegaly Ext: No lower extremity  edema with compression stockings in place.  Radial, PT, and DP pulses are 2+ bilaterally. Skin: warm and dry without rash  EKG:  Normal sinus rhythm with left axis deviation left bundle branch block. No significant change from prior tracing on 07/22/16 (I have personally reviewed both tracings).  Lab Results  Component Value Date   WBC 6.5 06/27/2016   HGB 9.3 (L) 06/27/2016   HCT 27.2 (L) 06/27/2016   MCV 97.0 06/27/2016   PLT 129 (L) 06/27/2016    Lab Results  Component Value Date   NA 138 06/27/2016   K 3.7 06/27/2016   CL 106  06/27/2016   CO2 24 06/27/2016   BUN 37 (H) 06/27/2016   CREATININE 1.74 (H) 06/27/2016   GLUCOSE 101 (H) 06/27/2016   ALT 18 06/05/2016    Lab Results  Component Value Date   CHOL 142 06/05/2016   HDL 46 06/05/2016   LDLCALC 72 06/05/2016   TRIG 118 06/05/2016   CHOLHDL 3.1 06/05/2016    --------------------------------------------------------------------------------------------------  ASSESSMENT AND PLAN: Severe systolic heart failure due to presumed ischemic cardiomyopathy: The patient continues to appear euvolemic to dry with NYHA class III symptoms. Given his age and comorbidities, we again discussed goals of care and have agreed to defer invasive procedures. Though the patient to use to have some orthostatic blood pressure drops and dizziness, we have agreed to a trial of low dose metoprolol. We will initiate metoprolol succinate 12.5 mg daily. I have recommended that Mr. Jesberger continued to avoid driving. We will make arrangements for a home health visit to assess for possible modifications that may improve his safety at home, given his significant fatigue as well as recurrent falls.  Coronary artery disease: The patient reports 2 episodes of atypical chest pain over the last few weeks. He likely has significant CAD as evidenced by his severe cardiomyopathy and abnormal stress test from last year. We have been hesitant to proceed with coronary angiography due to his advanced age, frailty, and chronic kidney disease. We will continue with low-dose aspirin and rosuvastatin for secondary prevention. We will also start low-dose metoprolol, as above.  Orthostatic lightheadedness: This continues to be a problem for the patient despite being on midodrine. We discussed increasing midodrine, though we have agreed to defer this in lieu of adding low-dose metoprolol. I encouraged the patient to increase his fluid intake and to continue with compression stockings during the day. Unfortunately,  his symptoms may also be due to underlying neurologic issues, including parkinsonism and stroke. The patient reports that he is due to follow-up with his neurologist later this month.  Follow-up: Return to clinic in one month.  Nelva Bush, MD 08/19/2016 11:08 AM

## 2016-08-20 ENCOUNTER — Other Ambulatory Visit: Payer: Self-pay | Admitting: *Deleted

## 2016-08-20 DIAGNOSIS — R29898 Other symptoms and signs involving the musculoskeletal system: Secondary | ICD-10-CM

## 2016-08-20 DIAGNOSIS — I519 Heart disease, unspecified: Secondary | ICD-10-CM

## 2016-08-20 DIAGNOSIS — R55 Syncope and collapse: Secondary | ICD-10-CM

## 2016-08-20 DIAGNOSIS — I255 Ischemic cardiomyopathy: Secondary | ICD-10-CM

## 2016-08-30 DIAGNOSIS — I5022 Chronic systolic (congestive) heart failure: Secondary | ICD-10-CM | POA: Diagnosis not present

## 2016-08-30 DIAGNOSIS — N183 Chronic kidney disease, stage 3 (moderate): Secondary | ICD-10-CM | POA: Diagnosis not present

## 2016-08-30 DIAGNOSIS — I951 Orthostatic hypotension: Secondary | ICD-10-CM | POA: Diagnosis not present

## 2016-08-30 DIAGNOSIS — E785 Hyperlipidemia, unspecified: Secondary | ICD-10-CM | POA: Diagnosis not present

## 2016-08-30 DIAGNOSIS — Z9181 History of falling: Secondary | ICD-10-CM | POA: Diagnosis not present

## 2016-08-30 DIAGNOSIS — I130 Hypertensive heart and chronic kidney disease with heart failure and stage 1 through stage 4 chronic kidney disease, or unspecified chronic kidney disease: Secondary | ICD-10-CM | POA: Diagnosis not present

## 2016-08-30 DIAGNOSIS — I255 Ischemic cardiomyopathy: Secondary | ICD-10-CM | POA: Diagnosis not present

## 2016-08-30 DIAGNOSIS — M6281 Muscle weakness (generalized): Secondary | ICD-10-CM | POA: Diagnosis not present

## 2016-08-30 DIAGNOSIS — Z7982 Long term (current) use of aspirin: Secondary | ICD-10-CM | POA: Diagnosis not present

## 2016-09-02 DIAGNOSIS — I130 Hypertensive heart and chronic kidney disease with heart failure and stage 1 through stage 4 chronic kidney disease, or unspecified chronic kidney disease: Secondary | ICD-10-CM | POA: Diagnosis not present

## 2016-09-02 DIAGNOSIS — M6281 Muscle weakness (generalized): Secondary | ICD-10-CM | POA: Diagnosis not present

## 2016-09-02 DIAGNOSIS — N183 Chronic kidney disease, stage 3 (moderate): Secondary | ICD-10-CM | POA: Diagnosis not present

## 2016-09-02 DIAGNOSIS — I255 Ischemic cardiomyopathy: Secondary | ICD-10-CM | POA: Diagnosis not present

## 2016-09-02 DIAGNOSIS — I951 Orthostatic hypotension: Secondary | ICD-10-CM | POA: Diagnosis not present

## 2016-09-02 DIAGNOSIS — I5022 Chronic systolic (congestive) heart failure: Secondary | ICD-10-CM | POA: Diagnosis not present

## 2016-09-03 ENCOUNTER — Telehealth: Payer: Self-pay | Admitting: Internal Medicine

## 2016-09-03 NOTE — Telephone Encounter (Signed)
S/w with Ria Comment. She asked if OT could be added to patient's order. S/w in person to Dr End who confirmed OT was ok for patient. Ria Comment made aware that OT is ok for patient.

## 2016-09-03 NOTE — Telephone Encounter (Signed)
Please call with verbal order(can Leave a msg) for OT.

## 2016-09-04 DIAGNOSIS — M6281 Muscle weakness (generalized): Secondary | ICD-10-CM | POA: Diagnosis not present

## 2016-09-04 DIAGNOSIS — I5022 Chronic systolic (congestive) heart failure: Secondary | ICD-10-CM | POA: Diagnosis not present

## 2016-09-04 DIAGNOSIS — I255 Ischemic cardiomyopathy: Secondary | ICD-10-CM | POA: Diagnosis not present

## 2016-09-04 DIAGNOSIS — I130 Hypertensive heart and chronic kidney disease with heart failure and stage 1 through stage 4 chronic kidney disease, or unspecified chronic kidney disease: Secondary | ICD-10-CM | POA: Diagnosis not present

## 2016-09-04 DIAGNOSIS — N183 Chronic kidney disease, stage 3 (moderate): Secondary | ICD-10-CM | POA: Diagnosis not present

## 2016-09-04 DIAGNOSIS — I951 Orthostatic hypotension: Secondary | ICD-10-CM | POA: Diagnosis not present

## 2016-09-08 DIAGNOSIS — I255 Ischemic cardiomyopathy: Secondary | ICD-10-CM | POA: Diagnosis not present

## 2016-09-08 DIAGNOSIS — I5022 Chronic systolic (congestive) heart failure: Secondary | ICD-10-CM | POA: Diagnosis not present

## 2016-09-08 DIAGNOSIS — M6281 Muscle weakness (generalized): Secondary | ICD-10-CM | POA: Diagnosis not present

## 2016-09-08 DIAGNOSIS — I130 Hypertensive heart and chronic kidney disease with heart failure and stage 1 through stage 4 chronic kidney disease, or unspecified chronic kidney disease: Secondary | ICD-10-CM | POA: Diagnosis not present

## 2016-09-08 DIAGNOSIS — N183 Chronic kidney disease, stage 3 (moderate): Secondary | ICD-10-CM | POA: Diagnosis not present

## 2016-09-08 DIAGNOSIS — I951 Orthostatic hypotension: Secondary | ICD-10-CM | POA: Diagnosis not present

## 2016-09-10 DIAGNOSIS — N183 Chronic kidney disease, stage 3 (moderate): Secondary | ICD-10-CM | POA: Diagnosis not present

## 2016-09-10 DIAGNOSIS — I255 Ischemic cardiomyopathy: Secondary | ICD-10-CM | POA: Diagnosis not present

## 2016-09-10 DIAGNOSIS — M6281 Muscle weakness (generalized): Secondary | ICD-10-CM | POA: Diagnosis not present

## 2016-09-10 DIAGNOSIS — I130 Hypertensive heart and chronic kidney disease with heart failure and stage 1 through stage 4 chronic kidney disease, or unspecified chronic kidney disease: Secondary | ICD-10-CM | POA: Diagnosis not present

## 2016-09-10 DIAGNOSIS — I5022 Chronic systolic (congestive) heart failure: Secondary | ICD-10-CM | POA: Diagnosis not present

## 2016-09-10 DIAGNOSIS — I951 Orthostatic hypotension: Secondary | ICD-10-CM | POA: Diagnosis not present

## 2016-09-11 ENCOUNTER — Telehealth: Payer: Self-pay | Admitting: Internal Medicine

## 2016-09-11 DIAGNOSIS — N183 Chronic kidney disease, stage 3 (moderate): Secondary | ICD-10-CM | POA: Diagnosis not present

## 2016-09-11 DIAGNOSIS — I130 Hypertensive heart and chronic kidney disease with heart failure and stage 1 through stage 4 chronic kidney disease, or unspecified chronic kidney disease: Secondary | ICD-10-CM | POA: Diagnosis not present

## 2016-09-11 DIAGNOSIS — I5022 Chronic systolic (congestive) heart failure: Secondary | ICD-10-CM | POA: Diagnosis not present

## 2016-09-11 DIAGNOSIS — I951 Orthostatic hypotension: Secondary | ICD-10-CM | POA: Diagnosis not present

## 2016-09-11 DIAGNOSIS — M6281 Muscle weakness (generalized): Secondary | ICD-10-CM | POA: Diagnosis not present

## 2016-09-11 DIAGNOSIS — I255 Ischemic cardiomyopathy: Secondary | ICD-10-CM | POA: Diagnosis not present

## 2016-09-11 NOTE — Telephone Encounter (Signed)
Called [name] at [facility] health. Let her know I contacted patient and gave him instructions to decrease metoprolol succinate to 12.5 mg daily. She will call us back with any further concerns with patient if they arise.

## 2016-09-11 NOTE — Telephone Encounter (Signed)
Thank you for the update.  I agree with your recommendations, including taking appropriate dose of metoprolol and staying well-hydrated.  Keith Woods has a long history of orthostatic hypotension in the setting of severe systolic heart failure, limiting our treatment options.  We will follow-up as previously planned unless new symptoms develop in the meantime.

## 2016-09-11 NOTE — Telephone Encounter (Signed)
S/w Keith Woods from [facility]. Today pt's BP sitting 142/82, HR 51-57   Standing 100/70, HR 52. Patient denied dizziness, chest pain, SOB or any other symptoms while she was there. Patient reported he had passed out one time while having a bowel movement in the bathroom.  Home Health RN educated on not bearing down, staying hydrated, and making sure to move slowly when changing positions. RN stated the patient and wife are very forgetful. RN unsure if patient is taking amlodipine daily or as needed. She states pt is taking midodrine 1 tab TID and Toprol XL 25 mg daily. At last OV on 08/19/16 with Dr End,  "ASSESSMENT AND PLAN: Severe systolic heart failure due to presumed ischemic cardiomyopathy: The patient continues to appear euvolemic to dry with NYHA class III symptoms. Given his age and comorbidities, we again discussed goals of care and have agreed to defer invasive procedures. Though the patient to use to have some orthostatic blood pressure drops and dizziness, we have agreed to a trial of low dose metoprolol. We will initiate metoprolol succinate 12.5 mg daily. I have recommended that Keith Woods continued to avoid driving. We will make arrangements for a home health visit to assess for possible modifications that may improve his safety at home, given his significant fatigue as well as recurrent falls."   Patient was to start Metoprolol succinate 12.5 mg daily; however patient has been taking 25 mg daily. Home Health RN is concerned about his orthostatic BP. I will call patient to verify how he is taking medications.

## 2016-09-11 NOTE — Telephone Encounter (Signed)
Keith Woods calling from home health stating she is with patient now but pt doesn't seem to know why she is there She is currently there with patient.  And she needs to know patient parameters are  Sitting BP 142/82 hr 51-57 Standing BP 100/70 hr 52  Please advise.

## 2016-09-11 NOTE — Telephone Encounter (Signed)
S/w patient. He confirmed he is only taking amlodipine as needed basis. He is taking midodrine 1 tablet TID and has been taking Metoprolol succinate 25 mg daily. Patient reports that if he gets up or changes position slowly then he does not get dizzy. This only occurs when he moves too fast.  Advised patient to decrease Metoprolol succinate to 12.5 mg (0.5 tablet) by mouth once a day and to change positions slowly and drink plenty of fluids. He verbalized understanding.

## 2016-09-15 DIAGNOSIS — I130 Hypertensive heart and chronic kidney disease with heart failure and stage 1 through stage 4 chronic kidney disease, or unspecified chronic kidney disease: Secondary | ICD-10-CM | POA: Diagnosis not present

## 2016-09-15 DIAGNOSIS — M6281 Muscle weakness (generalized): Secondary | ICD-10-CM | POA: Diagnosis not present

## 2016-09-15 DIAGNOSIS — I5022 Chronic systolic (congestive) heart failure: Secondary | ICD-10-CM | POA: Diagnosis not present

## 2016-09-15 DIAGNOSIS — I255 Ischemic cardiomyopathy: Secondary | ICD-10-CM | POA: Diagnosis not present

## 2016-09-15 DIAGNOSIS — I951 Orthostatic hypotension: Secondary | ICD-10-CM | POA: Diagnosis not present

## 2016-09-15 DIAGNOSIS — N183 Chronic kidney disease, stage 3 (moderate): Secondary | ICD-10-CM | POA: Diagnosis not present

## 2016-09-16 DIAGNOSIS — I255 Ischemic cardiomyopathy: Secondary | ICD-10-CM | POA: Diagnosis not present

## 2016-09-16 DIAGNOSIS — M6281 Muscle weakness (generalized): Secondary | ICD-10-CM | POA: Diagnosis not present

## 2016-09-16 DIAGNOSIS — I130 Hypertensive heart and chronic kidney disease with heart failure and stage 1 through stage 4 chronic kidney disease, or unspecified chronic kidney disease: Secondary | ICD-10-CM | POA: Diagnosis not present

## 2016-09-16 DIAGNOSIS — I951 Orthostatic hypotension: Secondary | ICD-10-CM | POA: Diagnosis not present

## 2016-09-16 DIAGNOSIS — N183 Chronic kidney disease, stage 3 (moderate): Secondary | ICD-10-CM | POA: Diagnosis not present

## 2016-09-16 DIAGNOSIS — I5022 Chronic systolic (congestive) heart failure: Secondary | ICD-10-CM | POA: Diagnosis not present

## 2016-09-17 ENCOUNTER — Encounter (INDEPENDENT_AMBULATORY_CARE_PROVIDER_SITE_OTHER): Payer: Medicare Other | Admitting: Family Medicine

## 2016-09-17 DIAGNOSIS — I951 Orthostatic hypotension: Secondary | ICD-10-CM

## 2016-09-17 DIAGNOSIS — I5022 Chronic systolic (congestive) heart failure: Secondary | ICD-10-CM

## 2016-09-17 DIAGNOSIS — I130 Hypertensive heart and chronic kidney disease with heart failure and stage 1 through stage 4 chronic kidney disease, or unspecified chronic kidney disease: Secondary | ICD-10-CM | POA: Diagnosis not present

## 2016-09-17 DIAGNOSIS — M6281 Muscle weakness (generalized): Secondary | ICD-10-CM

## 2016-09-17 DIAGNOSIS — N183 Chronic kidney disease, stage 3 (moderate): Secondary | ICD-10-CM | POA: Diagnosis not present

## 2016-09-17 DIAGNOSIS — I255 Ischemic cardiomyopathy: Secondary | ICD-10-CM | POA: Diagnosis not present

## 2016-09-18 ENCOUNTER — Telehealth: Payer: Self-pay | Admitting: *Deleted

## 2016-09-18 DIAGNOSIS — I130 Hypertensive heart and chronic kidney disease with heart failure and stage 1 through stage 4 chronic kidney disease, or unspecified chronic kidney disease: Secondary | ICD-10-CM | POA: Diagnosis not present

## 2016-09-18 DIAGNOSIS — I5022 Chronic systolic (congestive) heart failure: Secondary | ICD-10-CM | POA: Diagnosis not present

## 2016-09-18 DIAGNOSIS — M6281 Muscle weakness (generalized): Secondary | ICD-10-CM | POA: Diagnosis not present

## 2016-09-18 DIAGNOSIS — N183 Chronic kidney disease, stage 3 (moderate): Secondary | ICD-10-CM | POA: Diagnosis not present

## 2016-09-18 DIAGNOSIS — I951 Orthostatic hypotension: Secondary | ICD-10-CM | POA: Diagnosis not present

## 2016-09-18 DIAGNOSIS — I255 Ischemic cardiomyopathy: Secondary | ICD-10-CM | POA: Diagnosis not present

## 2016-09-18 NOTE — Telephone Encounter (Signed)
[facility] order with begin date 09/08/16 for OT eval. Signed and dated by Dr End. Faxed to [facility].

## 2016-09-23 ENCOUNTER — Encounter: Payer: Self-pay | Admitting: Family Medicine

## 2016-09-23 ENCOUNTER — Ambulatory Visit (INDEPENDENT_AMBULATORY_CARE_PROVIDER_SITE_OTHER): Payer: Medicare Other | Admitting: Family Medicine

## 2016-09-23 VITALS — BP 142/80 | HR 71 | Temp 97.6°F | Resp 18 | Wt 181.0 lb

## 2016-09-23 DIAGNOSIS — M6281 Muscle weakness (generalized): Secondary | ICD-10-CM | POA: Diagnosis not present

## 2016-09-23 DIAGNOSIS — I951 Orthostatic hypotension: Secondary | ICD-10-CM

## 2016-09-23 DIAGNOSIS — I255 Ischemic cardiomyopathy: Secondary | ICD-10-CM

## 2016-09-23 DIAGNOSIS — I5022 Chronic systolic (congestive) heart failure: Secondary | ICD-10-CM | POA: Diagnosis not present

## 2016-09-23 DIAGNOSIS — R0989 Other specified symptoms and signs involving the circulatory and respiratory systems: Secondary | ICD-10-CM

## 2016-09-23 DIAGNOSIS — I10 Essential (primary) hypertension: Secondary | ICD-10-CM | POA: Diagnosis not present

## 2016-09-23 DIAGNOSIS — R55 Syncope and collapse: Secondary | ICD-10-CM | POA: Diagnosis not present

## 2016-09-23 DIAGNOSIS — F339 Major depressive disorder, recurrent, unspecified: Secondary | ICD-10-CM | POA: Diagnosis not present

## 2016-09-23 DIAGNOSIS — N183 Chronic kidney disease, stage 3 (moderate): Secondary | ICD-10-CM | POA: Diagnosis not present

## 2016-09-23 DIAGNOSIS — I130 Hypertensive heart and chronic kidney disease with heart failure and stage 1 through stage 4 chronic kidney disease, or unspecified chronic kidney disease: Secondary | ICD-10-CM | POA: Diagnosis not present

## 2016-09-23 MED ORDER — CITALOPRAM HYDROBROMIDE 10 MG PO TABS: 10.0000 mg | ORAL_TABLET | Freq: Two times a day (BID) | ORAL | 0 refills | Status: DC

## 2016-09-23 NOTE — Progress Notes (Signed)
Patient: Keith Woods Male    DOB: Dec 27, 1925   83 y.o.   MRN: [id number] Visit Date: 09/23/2016  Today's Provider: [name], MD   Chief Complaint  Patient presents with  . Coronary Artery Disease    follow up  . Tremors    follow up  . Hypotension    follow up  . Gout    follow up  . Anemia    follow up   Subjective:    HPI Follow up CAD: Patient was last seen for this problem 3 months ago and no changes were made. Patient reports good compliance with treatment and good tolerance.  He continues regular follow up with Dr. Saunders Revel. Is tolerating his statin well. Has had no chest pains.    Follow up Orthostatic Hypotension: Patient was last seen for this problem 3 months ago and was advised to stay hydrated and change positions slowly. Patient was also  Advised to follow up with Cardiology. Today patient reports his blood pressure has still been dropping when he stands. Continues on Midodrine and is tolerating well.   Follow up of Parkinson's Disease: Patient was last seen for this problem 3 months ago and no changes were made. Patient was to follow up with Dr. Manuella Ghazi. Has follow up with Dr. Manuella Ghazi in March. Has been working with home PT and OT and feels like his balance has improved over the last couple of months.    Follow up Gout: Patient was last seen for this problem 3 months ago and no changes were made. Patient reports good compliance with treatment. He states the big toe of his right foot has turned a dark color.   Follow up Depression Is tolerating 1/2 x 10mg  citalopram twice a day without adverse effects. He states his mood has been blah. Very poor energy.     Allergies  Allergen Reactions  . Lipitor [Atorvastatin Calcium]   . Nsaids   . Statins      Current Outpatient Prescriptions:  .  allopurinol (ZYLOPRIM) 100 MG tablet, take 2 tablets by mouth once daily, Disp: 60 tablet, Rfl: 5 .  amLODipine (NORVASC) 2.5 MG tablet, Take 2.5 mg by mouth  daily as needed (SBP > 140). , Disp: , Rfl:  .  aspirin EC 81 MG EC tablet, Take 1 tablet (81 mg total) by mouth daily., Disp: 30 tablet, Rfl: 0 .  b complex vitamins tablet, Take 1 tablet by mouth daily., Disp: , Rfl:  .  Carbidopa-Levodopa ER (SINEMET CR) 25-100 MG tablet controlled release, Take 1 tablet by mouth 2 (two) times daily., Disp: 60 tablet, Rfl: 0 .  citalopram (CELEXA) 10 MG tablet, Take 0.5 tablets (5 mg total) by mouth 2 (two) times daily., Disp: 90 tablet, Rfl: 4 .  Colchicine (MITIGARE) 0.6 MG CAPS, Two tablets today, then 1 tablet daily, Disp: 30 capsule, Rfl: 3 .  metoprolol succinate (TOPROL-XL) 25 MG 24 hr tablet, Take 1 tablet (25 mg total) by mouth daily. Take with or immediately following a meal., Disp: 30 tablet, Rfl: 3 .  midodrine (PROAMATINE) 5 MG tablet, Take 1 tablet (5 mg total) by mouth 3 (three) times daily with meals., Disp: 270 tablet, Rfl: 2 .  Multiple Vitamin (MULTIVITAMIN) tablet, Take 1 tablet by mouth daily.  , Disp: , Rfl:  .  nitroGLYCERIN (NITROSTAT) 0.4 MG SL tablet, Place 1 tablet (0.4 mg total) under the tongue every 5 (five) minutes as needed for chest pain., Disp:  100 tablet, Rfl: 3 .  Probiotic Product (ALIGN) 4 MG CAPS, Take 4 mg by mouth daily., Disp: , Rfl:  .  rosuvastatin (CRESTOR) 10 MG tablet, Take 1 tablet (10 mg total) by mouth daily., Disp: 30 tablet, Rfl: 5 .  sodium bicarbonate 650 MG tablet, Take 650 mg by mouth 2 (two) times daily., Disp: , Rfl: 0 .  Vitamin D, Ergocalciferol, (DRISDOL) 50000 units CAPS capsule, TAKE 1 CAPSULE ONCE A WEEK, Disp: 12 capsule, Rfl: 4  Review of Systems  Constitutional: Negative for appetite change, chills and fever.  Respiratory: Negative for chest tightness, shortness of breath and wheezing.   Cardiovascular: Negative for chest pain and palpitations.  Gastrointestinal: Negative for abdominal pain, nausea and vomiting.  Neurological: Positive for tremors and weakness.       Unsteadiness    Social  History  Substance Use Topics  . Smoking status: Never Smoker  . Smokeless tobacco: Never Used  . Alcohol use No   Objective:   BP (!) 142/80 (BP Location: Right Arm, Patient Position: Sitting, Cuff Size: Normal)   Pulse 71   Temp 97.6 F (36.4 C) (Oral)   Resp 18   Wt 181 lb (82.1 kg)   SpO2 99% Comment: room air  BMI 24.55 kg/m   Physical Exam   General Appearance:    Alert, cooperative, no distress  Eyes:    PERRL, conjunctiva/corneas clear, EOM's intact       Lungs:     Clear to auscultation bilaterally, respirations unlabored  Heart:    Regular rate and rhythm  Neurologic:   Awake, alert, oriented x 3. No apparent focal neurological           defect.   Derm:  violaceous discoloration of right great toenail        Assessment & Plan:     1. Orthostatic hypotension Relative stable on current regiment of midodrine and metoprolol. Follow up cardiology as scheduled.   2. Vasovagal syncope No recent episodes   3. . Depression, recurrent ([place]) Has been tolerating low dose of citalopram will, but remains very fatigued and likely depressed. Will trying increasing to a full 10mg  tablet twice a day. Call for new rx if tolerating well after 3-4 weeks.   Return in about 4 months (around 01/21/2017).         [name], MD  [facility]

## 2016-09-24 ENCOUNTER — Ambulatory Visit (INDEPENDENT_AMBULATORY_CARE_PROVIDER_SITE_OTHER): Payer: Medicare Other | Admitting: Internal Medicine

## 2016-09-24 ENCOUNTER — Encounter: Payer: Self-pay | Admitting: Internal Medicine

## 2016-09-24 VITALS — BP 155/79 | HR 93 | Ht 74.0 in | Wt 183.5 lb

## 2016-09-24 DIAGNOSIS — I255 Ischemic cardiomyopathy: Secondary | ICD-10-CM

## 2016-09-24 DIAGNOSIS — I519 Heart disease, unspecified: Secondary | ICD-10-CM

## 2016-09-24 DIAGNOSIS — I951 Orthostatic hypotension: Secondary | ICD-10-CM

## 2016-09-24 DIAGNOSIS — I2510 Atherosclerotic heart disease of native coronary artery without angina pectoris: Secondary | ICD-10-CM

## 2016-09-24 NOTE — Patient Instructions (Signed)
Medication Instructions:  Your physician recommends that you continue on your current medications as directed. Please refer to the Current Medication list given to you today.   Labwork: none  Testing/Procedures: none  Follow-Up: Your physician recommends that you schedule a follow-up appointment in: 2 MONTHS WITH DR END.  If you need a refill on your cardiac medications before your next appointment, please call your pharmacy.   

## 2016-09-24 NOTE — Progress Notes (Signed)
Follow-up Outpatient Visit Date: 09/24/2016  Chief Complaint: Follow-up fatigue and lightheadedness  HPI:  Keith Woods is a 83 y.o. year-old male with history of chronic systolic heart failure with severely reduced LVEF (presumed ischemic based on abnormal myocardial perfusion stress test in 03/2016), stroke, hypertension, hyperlipidemia, recurrent syncope, chronic kidney disease stage III, and Parkinson's symptoms, who presents for follow-up of recurrent lightheadedness/syncope with orthostatic hypotension. The patient was admitted to [facility] on 06/24/16 after being found unresponsive on the toilet in the middle the night by his wife. Upon arrival in the hospital, the patient was noted to have significant orthostatic hypotension. He was gently hydrated with down titration and ultimate discontinuation of metoprolol. He continued to have significant orthostatic hypotension, prompting addition of midodrine. Repeat echocardiogram during his hospitalization demonstrated persistent severe LV dysfunction with an EF of 25-30%. Since leaving the hospital, he has struggled with fatigue and occasional lightheadedness.  Since our last visit on 08/19/16, Keith Woods reports less frequent lightheadedness. He has been drinking a little more water and is also getting up more slowly. He remains on midodrine 5 mg TID and metoprolol succinate 25 mg daily. He has not had any further episodes of syncope or chest pain, and has not needed to use sublingual nitroglycerin. He also denies falls. He has been participating with several different therapists at home and notes that he is moving a bit better. He denies leg edema, though he feels as though the balls of his feet are somewhat puffy. He is wearing compression stockings regularly but does not see much of a difference.  --------------------------------------------------------------------------------------------------  Cardiovascular History & Procedures: Cardiovascular  Problems:  Severe systolic heart failure (presumed ischemic)  Stroke  Orthostatic hypotension  Risk Factors:  Known CAD, hypertension, hyperlipidemia, age greater than 83, and male gender  Cath/PCI:  LHC (08/28/06): LMCA normal. LAD with mild luminal irregularities. Moderate sized ramus with mild luminal irregularities. Codominant LCx with mild luminal irregularities. Small moderate sized codominant RCA with mild luminal irregularities. LVEF 60%.  CV Surgery:  None  EP Procedures and Devices:  None  Non-Invasive Evaluation(s):  Transthoracic echocardiogram (03/08/16): Normal LV size with severely reduced contraction (EF 25-30%) with diffuse hypokinesis. Grade 1 diastolic dysfunction. Mild MR. Normal RV size and function. Trivial TR.  Pharmacologic myocardial perfusion stress test (04/10/16): Large, severe inferior and apical defect consistent with prior infarction. No evidence of ischemia. LVEF 26%.  Limited echo (06/24/16): Normal LV size and wall thickness with LVEF of 25-30%. Mild aortic regurgitation. Normal RV size and function.  Recent CV Pertinent Labs: Lab Results  Component Value Date   CHOL 142 06/05/2016   HDL 46 06/05/2016   LDLCALC 72 06/05/2016   TRIG 118 06/05/2016   CHOLHDL 3.1 06/05/2016   CHOLHDL 5.2 03/09/2016   INR 1.02 03/08/2016   K 3.7 06/27/2016   MG 1.8 06/25/2016   BUN 37 (H) 06/27/2016   BUN 26 02/05/2016   CREATININE 1.74 (H) 06/27/2016    Past medical and surgical history were reviewed and updated in EPIC.   Outpatient Encounter Prescriptions as of 09/24/2016  Medication Sig  . allopurinol (ZYLOPRIM) 100 MG tablet take 2 tablets by mouth once daily  . amLODipine (NORVASC) 2.5 MG tablet Take 2.5 mg by mouth daily as needed (SBP > 140).   Marland Kitchen aspirin EC 81 MG EC tablet Take 1 tablet (81 mg total) by mouth daily.  Marland Kitchen b complex vitamins tablet Take 1 tablet by mouth daily.  . Carbidopa-Levodopa ER (SINEMET CR) 25-100  MG tablet  controlled release Take 1 tablet by mouth 2 (two) times daily.  . citalopram (CELEXA) 10 MG tablet Take 1 tablet (10 mg total) by mouth 2 (two) times daily.  . Colchicine (MITIGARE) 0.6 MG CAPS Two tablets today, then 1 tablet daily  . metoprolol succinate (TOPROL-XL) 25 MG 24 hr tablet Take 1 tablet (25 mg total) by mouth daily. Take with or immediately following a meal.  . midodrine (PROAMATINE) 5 MG tablet Take 1 tablet (5 mg total) by mouth 3 (three) times daily with meals.  . Multiple Vitamin (MULTIVITAMIN) tablet Take 1 tablet by mouth daily.    . nitroGLYCERIN (NITROSTAT) 0.4 MG SL tablet Place 1 tablet (0.4 mg total) under the tongue every 5 (five) minutes as needed for chest pain.  . Probiotic Product (ALIGN) 4 MG CAPS Take 4 mg by mouth daily.  . rosuvastatin (CRESTOR) 10 MG tablet Take 1 tablet (10 mg total) by mouth daily.  . sodium bicarbonate 650 MG tablet Take 650 mg by mouth 2 (two) times daily.  . Vitamin D, Ergocalciferol, (DRISDOL) 50000 units CAPS capsule TAKE 1 CAPSULE ONCE A WEEK   No facility-administered encounter medications on file as of 09/24/2016.     Allergies: Lipitor [atorvastatin calcium]; Nsaids; and Statins  Social History   Social History  . Marital status: Married    Spouse name: N/A  . Number of children: 3  . Years of education: N/A   Occupational History  . minister-retired    Social History Main Topics  . Smoking status: Never Smoker  . Smokeless tobacco: Never Used  . Alcohol use No  . Drug use: No  . Sexual activity: Not on file   Other Topics Concern  . Not on file   Social History Narrative  . No narrative on file    Family History  Problem Relation Age of Onset  . Ovarian cancer Mother   . Prostate cancer Father     Review of Systems: A 12-system review of systems was performed and was negative except as noted in the  HPI.  --------------------------------------------------------------------------------------------------  Physical Exam: BP (!) 155/79 (BP Location: Left Arm, Patient Position: Sitting, Cuff Size: Normal)   Pulse 93   Ht 6\' 2"  (1.88 m)   Wt 183 lb 8 oz (83.2 kg)   BMI 23.56 kg/m   Position Blood pressure (mmHg) Heart rate (bpm)  Lying 171/80 86  Sitting 129/80 93  Standing 119/74 99  Standing (3 minutes) 115/77 106   General:  Elderly man, seated comfortably on the exam table. He is accompanied by his daughter. HEENT: No conjunctival pallor or scleral icterus.  Moist mucous membranes.  OP clear. Neck: Supple without lymphadenopathy, thyromegaly, JVD, or HJR.  Lungs: Normal work of breathing.  Clear to auscultation bilaterally without wheezes or crackles. Heart: Regular rate and rhythm without murmurs, rubs, or gallops.  Non-displaced PMI. Abd: Bowel sounds present.  Soft, NT/ND without hepatosplenomegaly Ext: No lower extremity edema with compression stockings in place.  After removal of compressions stockings, there is no significant LE edema (including on the balls of his feet). Radial, PT, and DP pulses are 2+ bilaterally. Skin: warm and dry without rash  EKG:  Normal sinus rhythm with left axis deviation left bundle branch block. Compared with prior tracing on 08/19/16, PACs are no longer present (I have personally reviewed both tracings).  Lab Results  Component Value Date   WBC 6.5 06/27/2016   HGB 9.3 (L) 06/27/2016   HCT  27.2 (L) 06/27/2016   MCV 97.0 06/27/2016   PLT 129 (L) 06/27/2016    Lab Results  Component Value Date   NA 138 06/27/2016   K 3.7 06/27/2016   CL 106 06/27/2016   CO2 24 06/27/2016   BUN 37 (H) 06/27/2016   CREATININE 1.74 (H) 06/27/2016   GLUCOSE 101 (H) 06/27/2016   ALT 18 06/05/2016    Lab Results  Component Value Date   CHOL 142 06/05/2016   HDL 46 06/05/2016   LDLCALC 72 06/05/2016   TRIG 118 06/05/2016   CHOLHDL 3.1 06/05/2016     --------------------------------------------------------------------------------------------------  ASSESSMENT AND PLAN: Severe systolic heart failure due to presumed ischemic cardiomyopathy: Keith Woods appears euvolemic and well-compensated. We will continue his current dose of metoprolol succinate. He has not tolerated ACEI/ARB in the past due to orthostatic hypotension.  Coronary artery disease: No further episodes of chest pain since our last visit. We will continue with aspirin and rosuvastatin for secondary prevention.  Orthostatic lightheadedness: Vital signs today again show orthostatic drop in blood pressure. Patient notes some improvement with increased fluid intake and standing up more slowly. We discussed switching to fludrocortisone, though we run the risk of salt and fluid retention in the setting of Keith Woods's severe systolic heart failure. He has moderate hypertension when lying today. As his symptoms are better than at past visits, we will not make any changes at this time. I encouraged him to continue drinking water and wearing his compression stockings. If his SBP is above 180 mmHg, he can take amlodipine 2.5 mg x 1, as previously discussed.  Follow-up: Return to clinic in two months.  Nelva Bush, MD 09/24/2016 11:02 AM

## 2016-09-25 DIAGNOSIS — I951 Orthostatic hypotension: Secondary | ICD-10-CM | POA: Diagnosis not present

## 2016-09-25 DIAGNOSIS — M6281 Muscle weakness (generalized): Secondary | ICD-10-CM | POA: Diagnosis not present

## 2016-09-25 DIAGNOSIS — I130 Hypertensive heart and chronic kidney disease with heart failure and stage 1 through stage 4 chronic kidney disease, or unspecified chronic kidney disease: Secondary | ICD-10-CM | POA: Diagnosis not present

## 2016-09-25 DIAGNOSIS — I5022 Chronic systolic (congestive) heart failure: Secondary | ICD-10-CM | POA: Diagnosis not present

## 2016-09-25 DIAGNOSIS — N183 Chronic kidney disease, stage 3 (moderate): Secondary | ICD-10-CM | POA: Diagnosis not present

## 2016-09-25 DIAGNOSIS — I255 Ischemic cardiomyopathy: Secondary | ICD-10-CM | POA: Diagnosis not present

## 2016-09-29 ENCOUNTER — Encounter: Payer: Self-pay | Admitting: Internal Medicine

## 2016-09-29 DIAGNOSIS — M109 Gout, unspecified: Secondary | ICD-10-CM | POA: Diagnosis not present

## 2016-09-29 DIAGNOSIS — E875 Hyperkalemia: Secondary | ICD-10-CM | POA: Diagnosis not present

## 2016-09-29 DIAGNOSIS — E872 Acidosis: Secondary | ICD-10-CM | POA: Diagnosis not present

## 2016-09-29 DIAGNOSIS — G909 Disorder of the autonomic nervous system, unspecified: Secondary | ICD-10-CM | POA: Diagnosis not present

## 2016-09-29 DIAGNOSIS — N2581 Secondary hyperparathyroidism of renal origin: Secondary | ICD-10-CM | POA: Diagnosis not present

## 2016-09-29 DIAGNOSIS — N184 Chronic kidney disease, stage 4 (severe): Secondary | ICD-10-CM | POA: Diagnosis not present

## 2016-10-02 DIAGNOSIS — I130 Hypertensive heart and chronic kidney disease with heart failure and stage 1 through stage 4 chronic kidney disease, or unspecified chronic kidney disease: Secondary | ICD-10-CM | POA: Diagnosis not present

## 2016-10-02 DIAGNOSIS — I255 Ischemic cardiomyopathy: Secondary | ICD-10-CM | POA: Diagnosis not present

## 2016-10-02 DIAGNOSIS — I5022 Chronic systolic (congestive) heart failure: Secondary | ICD-10-CM | POA: Diagnosis not present

## 2016-10-02 DIAGNOSIS — M6281 Muscle weakness (generalized): Secondary | ICD-10-CM | POA: Diagnosis not present

## 2016-10-02 DIAGNOSIS — N183 Chronic kidney disease, stage 3 (moderate): Secondary | ICD-10-CM | POA: Diagnosis not present

## 2016-10-02 DIAGNOSIS — I951 Orthostatic hypotension: Secondary | ICD-10-CM | POA: Diagnosis not present

## 2016-10-17 DIAGNOSIS — G20 Parkinson's disease: Secondary | ICD-10-CM | POA: Diagnosis not present

## 2016-10-17 DIAGNOSIS — G4752 REM sleep behavior disorder: Secondary | ICD-10-CM | POA: Diagnosis not present

## 2016-10-17 DIAGNOSIS — R413 Other amnesia: Secondary | ICD-10-CM | POA: Diagnosis not present

## 2016-10-17 DIAGNOSIS — Z8659 Personal history of other mental and behavioral disorders: Secondary | ICD-10-CM | POA: Diagnosis not present

## 2016-10-20 ENCOUNTER — Encounter: Payer: Self-pay | Admitting: Family Medicine

## 2016-10-20 ENCOUNTER — Telehealth: Payer: Self-pay | Admitting: Family Medicine

## 2016-10-20 ENCOUNTER — Ambulatory Visit (INDEPENDENT_AMBULATORY_CARE_PROVIDER_SITE_OTHER): Payer: Medicare Other | Admitting: Family Medicine

## 2016-10-20 VITALS — BP 124/78 | HR 70 | Temp 97.5°F | Resp 16 | Wt 177.0 lb

## 2016-10-20 DIAGNOSIS — R0989 Other specified symptoms and signs involving the circulatory and respiratory systems: Secondary | ICD-10-CM

## 2016-10-20 DIAGNOSIS — M79604 Pain in right leg: Secondary | ICD-10-CM | POA: Diagnosis not present

## 2016-10-20 DIAGNOSIS — I10 Essential (primary) hypertension: Secondary | ICD-10-CM | POA: Diagnosis not present

## 2016-10-20 DIAGNOSIS — G20 Parkinson's disease: Secondary | ICD-10-CM

## 2016-10-20 DIAGNOSIS — I255 Ischemic cardiomyopathy: Secondary | ICD-10-CM | POA: Diagnosis not present

## 2016-10-20 DIAGNOSIS — F339 Major depressive disorder, recurrent, unspecified: Secondary | ICD-10-CM

## 2016-10-20 DIAGNOSIS — R29898 Other symptoms and signs involving the musculoskeletal system: Secondary | ICD-10-CM | POA: Diagnosis not present

## 2016-10-20 MED ORDER — KETOROLAC TROMETHAMINE 60 MG/2ML IM SOLN
60.0000 mg | Freq: Once | INTRAMUSCULAR | Status: AC
  Administered 2016-10-20: 60 mg via INTRAMUSCULAR

## 2016-10-20 MED ORDER — CITALOPRAM HYDROBROMIDE 10 MG PO TABS: 10.0000 mg | ORAL_TABLET | Freq: Two times a day (BID) | ORAL | 0 refills | Status: DC

## 2016-10-20 MED ORDER — CITALOPRAM HYDROBROMIDE 10 MG PO TABS: 10.0000 mg | ORAL_TABLET | Freq: Two times a day (BID) | ORAL | 4 refills | Status: DC

## 2016-10-20 NOTE — Telephone Encounter (Signed)
done

## 2016-10-20 NOTE — Telephone Encounter (Signed)
Please advise 

## 2016-10-20 NOTE — Progress Notes (Signed)
Patient: Keith Woods Male    DOB: 02-21-1926   83 y.o.   MRN: [id number] Visit Date: 10/20/2016  Today's Provider: [name], MD   No chief complaint on file.  Subjective:    HPI  Patient presents for follow up multiple chronic medication conditions. Completed home physical therapy last month, but has been declining every since. Feels weaker and like legs are giving out. Has had more pain in right leg lately. Has been on prednisone off and on  off last year for borderline elevated side rate, but pain is now limited to just right thigh. Continues regular follow up with Dr. Saunders Revel for orthostatic hypotension and neurology for parkinson's He did have citalopram increased last visit here a month ago, but doesn't feel like energy level is any better. Family does feel that PT was helpful and that he is not able to do his exercises on his own since PT ended.    Allergies  Allergen Reactions  . Lipitor [Atorvastatin Calcium]   . Nsaids   . Statins      Current Outpatient Prescriptions:  .  allopurinol (ZYLOPRIM) 100 MG tablet, take 2 tablets by mouth once daily, Disp: 60 tablet, Rfl: 5 .  amLODipine (NORVASC) 2.5 MG tablet, Take 2.5 mg by mouth daily as needed (SBP > 140). , Disp: , Rfl:  .  aspirin EC 81 MG EC tablet, Take 1 tablet (81 mg total) by mouth daily., Disp: 30 tablet, Rfl: 0 .  b complex vitamins tablet, Take 1 tablet by mouth daily., Disp: , Rfl:  .  Carbidopa-Levodopa ER (SINEMET CR) 25-100 MG tablet controlled release, Take 1 tablet by mouth 2 (two) times daily., Disp: 60 tablet, Rfl: 0 .  citalopram (CELEXA) 10 MG tablet, Take 1 tablet (10 mg total) by mouth 2 (two) times daily., Disp: 1 tablet, Rfl: 0 .  Colchicine (MITIGARE) 0.6 MG CAPS, Two tablets today, then 1 tablet daily, Disp: 30 capsule, Rfl: 3 .  metoprolol succinate (TOPROL-XL) 25 MG 24 hr tablet, Take 1 tablet (25 mg total) by mouth daily. Take with or immediately following a meal., Disp: 30  tablet, Rfl: 3 .  midodrine (PROAMATINE) 5 MG tablet, Take 1 tablet (5 mg total) by mouth 3 (three) times daily with meals., Disp: 270 tablet, Rfl: 2 .  Multiple Vitamin (MULTIVITAMIN) tablet, Take 1 tablet by mouth daily.  , Disp: , Rfl:  .  nitroGLYCERIN (NITROSTAT) 0.4 MG SL tablet, Place 1 tablet (0.4 mg total) under the tongue every 5 (five) minutes as needed for chest pain., Disp: 100 tablet, Rfl: 3 .  Probiotic Product (ALIGN) 4 MG CAPS, Take 4 mg by mouth daily., Disp: , Rfl:  .  rosuvastatin (CRESTOR) 10 MG tablet, Take 1 tablet (10 mg total) by mouth daily., Disp: 30 tablet, Rfl: 5 .  sodium bicarbonate 650 MG tablet, Take 650 mg by mouth 2 (two) times daily., Disp: , Rfl: 0 .  Vitamin D, Ergocalciferol, (DRISDOL) 50000 units CAPS capsule, TAKE 1 CAPSULE ONCE A WEEK, Disp: 12 capsule, Rfl: 4  Review of Systems  Constitutional: Negative for appetite change, chills and fever.  Respiratory: Negative for chest tightness, shortness of breath and wheezing.   Cardiovascular: Negative for chest pain and palpitations.  Gastrointestinal: Negative for abdominal pain, nausea and vomiting.    Social History  Substance Use Topics  . Smoking status: Never Smoker  . Smokeless tobacco: Never Used  . Alcohol use No   Objective:  BP 124/78 (BP Location: Right Arm, Patient Position: Sitting, Cuff Size: Normal)   Pulse 70   Temp 97.5 F (36.4 C) (Oral)   Resp 16   Wt 177 lb (80.3 kg)   SpO2 97%   BMI 22.73 kg/m  There were no vitals filed for this visit.   Physical Exam   General Appearance:    Alert, cooperative, no distress  Eyes:    PERRL, conjunctiva/corneas clear, EOM's intact       Lungs:     Clear to auscultation bilaterally, respirations unlabored  Heart:    Regular rate and rhythm  Neurologic:   Awake, alert, oriented x 3. No apparent focal neurological           defect.           Assessment & Plan:     1. Labile hypertension Fairly stable. Reinforced importance of  drinking plenty fluids. He was previously advised to drink 10 glasses of water every day, but he has only been drinking six.   2. Parkinson's disease ([place]) Continue routine follow up rheumatology.   3. Weakness of both legs Due to multiple chronic health conditions. Has deteriorated since stopping PT last month.   - Ambulatory referral to Physical Therapy  4. Right leg pain  - ketorolac (TORADOL) injection 60 mg; Inject 2 mLs (60 mg total) into the muscle once.  5. Depression, recurrent ([place]) Tolerating increased dose of citalopram.   - citalopram (CELEXA) 10 MG tablet; Take 1 tablet (10 mg total) by mouth 2 (two) times daily.  Dispense: 180 tablet; Refill: 4  Return in about 4 months (around 02/19/2017).       [name], MD  [facility]

## 2016-10-20 NOTE — Telephone Encounter (Signed)
Pt knows the Rx for citalopram (CELEXA) 10 MG tablet, was sent to [facility] today but since he is out of the medication pt is requesting a 30 Rx be sent to [facility]. [facility]. To last until his mail order comes in. Please advise. Thanks TNP

## 2016-10-27 ENCOUNTER — Telehealth: Payer: Self-pay | Admitting: Family Medicine

## 2016-10-27 DIAGNOSIS — R29898 Other symptoms and signs involving the musculoskeletal system: Secondary | ICD-10-CM

## 2016-10-27 NOTE — Telephone Encounter (Signed)
Please advise 

## 2016-10-27 NOTE — Telephone Encounter (Signed)
Pt's daughter [name] stated that pt has been getting his physical therapy at home but the last referral was done for [facility]. Pt advised daughter that he didn't feel he could do therapy outside of home. [name] is requesting that we do the referral and order for PT at home. Please advise. Thanks TNP

## 2016-10-27 NOTE — Telephone Encounter (Signed)
Can you please cancel outpatient PT referral and order Home health for physical therapy for weakness and parkinson's diesease.

## 2016-10-29 DIAGNOSIS — M79661 Pain in right lower leg: Secondary | ICD-10-CM | POA: Diagnosis not present

## 2016-10-29 DIAGNOSIS — G20 Parkinson's disease: Secondary | ICD-10-CM | POA: Diagnosis not present

## 2016-10-31 DIAGNOSIS — M79661 Pain in right lower leg: Secondary | ICD-10-CM | POA: Diagnosis not present

## 2016-10-31 DIAGNOSIS — G20 Parkinson's disease: Secondary | ICD-10-CM | POA: Diagnosis not present

## 2016-11-03 DIAGNOSIS — G20 Parkinson's disease: Secondary | ICD-10-CM | POA: Diagnosis not present

## 2016-11-03 DIAGNOSIS — M79661 Pain in right lower leg: Secondary | ICD-10-CM | POA: Diagnosis not present

## 2016-11-05 DIAGNOSIS — G20 Parkinson's disease: Secondary | ICD-10-CM | POA: Diagnosis not present

## 2016-11-05 DIAGNOSIS — M79661 Pain in right lower leg: Secondary | ICD-10-CM | POA: Diagnosis not present

## 2016-11-11 ENCOUNTER — Ambulatory Visit: Payer: Medicare Other

## 2016-11-11 ENCOUNTER — Encounter (INDEPENDENT_AMBULATORY_CARE_PROVIDER_SITE_OTHER): Payer: Medicare Other | Admitting: Family Medicine

## 2016-11-11 DIAGNOSIS — Z9181 History of falling: Secondary | ICD-10-CM | POA: Diagnosis not present

## 2016-11-11 DIAGNOSIS — Z7982 Long term (current) use of aspirin: Secondary | ICD-10-CM

## 2016-11-11 DIAGNOSIS — F339 Major depressive disorder, recurrent, unspecified: Secondary | ICD-10-CM | POA: Diagnosis not present

## 2016-11-11 DIAGNOSIS — I10 Essential (primary) hypertension: Secondary | ICD-10-CM

## 2016-11-11 DIAGNOSIS — G20 Parkinson's disease: Secondary | ICD-10-CM | POA: Diagnosis not present

## 2016-11-11 DIAGNOSIS — M79661 Pain in right lower leg: Secondary | ICD-10-CM

## 2016-11-12 DIAGNOSIS — G20 Parkinson's disease: Secondary | ICD-10-CM | POA: Diagnosis not present

## 2016-11-12 DIAGNOSIS — M79661 Pain in right lower leg: Secondary | ICD-10-CM | POA: Diagnosis not present

## 2016-11-14 DIAGNOSIS — G20 Parkinson's disease: Secondary | ICD-10-CM | POA: Diagnosis not present

## 2016-11-14 DIAGNOSIS — M79661 Pain in right lower leg: Secondary | ICD-10-CM | POA: Diagnosis not present

## 2016-11-17 DIAGNOSIS — G20 Parkinson's disease: Secondary | ICD-10-CM | POA: Diagnosis not present

## 2016-11-17 DIAGNOSIS — M79661 Pain in right lower leg: Secondary | ICD-10-CM | POA: Diagnosis not present

## 2016-11-19 DIAGNOSIS — G20 Parkinson's disease: Secondary | ICD-10-CM | POA: Diagnosis not present

## 2016-11-19 DIAGNOSIS — M79661 Pain in right lower leg: Secondary | ICD-10-CM | POA: Diagnosis not present

## 2016-11-21 ENCOUNTER — Telehealth: Payer: Self-pay | Admitting: Family Medicine

## 2016-11-21 IMAGING — NM NM MISC PROCEDURE
6 series · 36 of 36 positions shown · non-contrast
Comparison: none

[Series 1: wbr rest · 6.40mm/px · 6 of 64 frames shown]
[frame 6/64]
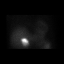
[frame 16/64]
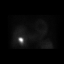
[frame 27/64]
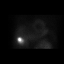
[frame 38/64]
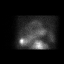
[frame 48/64]
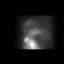
[frame 59/64]
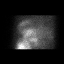

[Series 1: wbr_r-proj_st wbr rest · 6.40mm/px · 6 of 64 frames shown]
[frame 6/64]
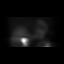
[frame 16/64]
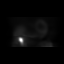
[frame 27/64]
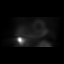
[frame 38/64]
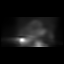
[frame 48/64]
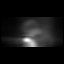
[frame 59/64]
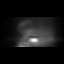

[Series 2: wbr_s-proj_st wbr stress-gsp · 6.40mm/px · 6 of 512 frames shown]
[frame 43/512]
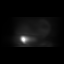
[frame 128/512]
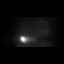
[frame 214/512]
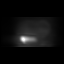
[frame 299/512]
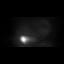
[frame 384/512]
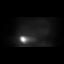
[frame 470/512]
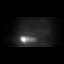

[Series 2: wbr stress-gsp · 6.40mm/px · 6 of 512 frames shown]
[frame 43/512]
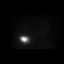
[frame 128/512]
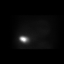
[frame 214/512]
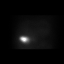
[frame 299/512]
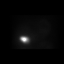
[frame 384/512]
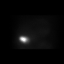
[frame 470/512]
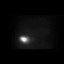

[Series 3: wbr stress-sum-em · 6.40mm/px · 6 of 64 frames shown]
[frame 6/64]
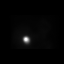
[frame 16/64]
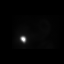
[frame 27/64]
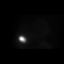
[frame 38/64]
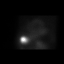
[frame 48/64]
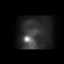
[frame 59/64]
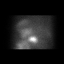

[Series 3: wbr_s-proj_st wbr stress-sum-em · 6.40mm/px · 6 of 64 frames shown]
[frame 6/64]
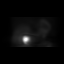
[frame 16/64]
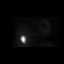
[frame 27/64]
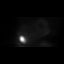
[frame 38/64]
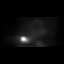
[frame 48/64]
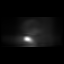
[frame 59/64]
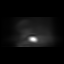

[36 of 36 positions shown; findings below may reference images not displayed]

Canned report from images found in remote index.

Refer to host system for actual result text.

## 2016-11-21 NOTE — Telephone Encounter (Signed)
Called Pt to schedule AWV with [facility] - knb °

## 2016-11-24 DIAGNOSIS — G20 Parkinson's disease: Secondary | ICD-10-CM | POA: Diagnosis not present

## 2016-11-24 DIAGNOSIS — M79661 Pain in right lower leg: Secondary | ICD-10-CM | POA: Diagnosis not present

## 2016-11-26 ENCOUNTER — Encounter: Payer: Self-pay | Admitting: Internal Medicine

## 2016-11-26 ENCOUNTER — Ambulatory Visit (INDEPENDENT_AMBULATORY_CARE_PROVIDER_SITE_OTHER): Payer: Medicare Other | Admitting: Internal Medicine

## 2016-11-26 VITALS — BP 130/62 | HR 69 | Ht 73.0 in | Wt 176.5 lb

## 2016-11-26 DIAGNOSIS — R5383 Other fatigue: Secondary | ICD-10-CM | POA: Diagnosis not present

## 2016-11-26 DIAGNOSIS — I951 Orthostatic hypotension: Secondary | ICD-10-CM | POA: Diagnosis not present

## 2016-11-26 DIAGNOSIS — I2510 Atherosclerotic heart disease of native coronary artery without angina pectoris: Secondary | ICD-10-CM | POA: Diagnosis not present

## 2016-11-26 DIAGNOSIS — G20 Parkinson's disease: Secondary | ICD-10-CM | POA: Diagnosis not present

## 2016-11-26 DIAGNOSIS — I5022 Chronic systolic (congestive) heart failure: Secondary | ICD-10-CM | POA: Diagnosis not present

## 2016-11-26 DIAGNOSIS — Z515 Encounter for palliative care: Secondary | ICD-10-CM | POA: Diagnosis not present

## 2016-11-26 MED ORDER — BISOPROLOL FUMARATE 5 MG PO TABS: 2.5000 mg | ORAL_TABLET | Freq: Every day | ORAL | 3 refills | Status: DC

## 2016-11-26 NOTE — Progress Notes (Signed)
Follow-up Outpatient Visit Date: 11/26/2016  Chief Complaint: Follow-up fatigue and lightheadedness  HPI:  [name] is a 83 y.o. year-old male with history of chronic systolic heart failure with severely reduced LVEF (presumed ischemic based on abnormal myocardial perfusion stress test in 03/2016), stroke, hypertension, hyperlipidemia, recurrent syncope, chronic kidney disease stage III, and Parkinson's symptoms, who presents for follow-up of recurrent lightheadedness/syncope with orthostatic hypotension. The patient was admitted to [facility] on 06/24/16 after being found unresponsive on the toilet in the middle the night by his wife. Upon arrival in the hospital, the patient was noted to have significant orthostatic hypotension. He was gently hydrated with down titration and ultimate discontinuation of metoprolol. He continued to have significant orthostatic hypotension, prompting addition of midodrine. Repeat echocardiogram during his hospitalization demonstrated persistent severe LV dysfunction with an EF of 25-30%. Since leaving the hospital, he has struggled with fatigue and occasional lightheadedness.  I last saw [name] ~2 months ago. Since then, he has not had any significant change is his symptoms. His biggest complain remains fatigue. He would like to be more active, doing things around the house and in his yard, but simply does not have the energy. He would also like to begin driving again. He denies chest pain and shortness of breath. He continues to have occasional brief orthostatic lightheadedness. He lost his balance getting up from a chair a few weeks ago and fell. Fortunately, he did not have any significant injuries. He remains compliant with his medications and is also wearing compression stockings as previously recommended. He is trying to stay well hydrated but does not drink as much water as he knows he should. He typically sleeps well at night.  [name] has been bothered by right  thigh pain, extending along the lateral margin from the hip to the knee. He has received an injection by his PCP and is currently working with physical therapy. Unfortunately, he has not experienced significant improvement.  --------------------------------------------------------------------------------------------------  Cardiovascular History & Procedures: Cardiovascular Problems:  Severe systolic heart failure (presumed ischemic)  Stroke  Orthostatic hypotension  Risk Factors:  Known CAD, hypertension, hyperlipidemia, age greater than 75, and male gender  Cath/PCI:  LHC (08/28/06): LMCA normal. LAD with mild luminal irregularities. Moderate sized ramus with mild luminal irregularities. Codominant LCx with mild luminal irregularities. Small moderate sized codominant RCA with mild luminal irregularities. LVEF 60%.  CV Surgery:  None  EP Procedures and Devices:  None  Non-Invasive Evaluation(s):  Transthoracic echocardiogram (03/08/16): Normal LV size with severely reduced contraction (EF 25-30%) with diffuse hypokinesis. Grade 1 diastolic dysfunction. Mild MR. Normal RV size and function. Trivial TR.  Pharmacologic myocardial perfusion stress test (04/10/16): Large, severe inferior and apical defect consistent with prior infarction. No evidence of ischemia. LVEF 26%.  Limited echo (06/24/16): Normal LV size and wall thickness with LVEF of 25-30%. Mild aortic regurgitation. Normal RV size and function.  Recent CV Pertinent Labs: Lab Results  Component Value Date   CHOL 142 06/05/2016   HDL 46 06/05/2016   LDLCALC 72 06/05/2016   TRIG 118 06/05/2016   CHOLHDL 3.1 06/05/2016   CHOLHDL 5.2 03/09/2016   INR 1.02 03/08/2016   K 3.7 06/27/2016   MG 1.8 06/25/2016   BUN 37 (H) 06/27/2016   BUN 26 02/05/2016   CREATININE 1.74 (H) 06/27/2016    Past medical and surgical history were reviewed and updated in EPIC.   Outpatient Encounter Prescriptions as of 11/26/2016    Medication Sig  . allopurinol (  ZYLOPRIM) 100 MG tablet take 2 tablets by mouth once daily  . aspirin EC 81 MG EC tablet Take 1 tablet (81 mg total) by mouth daily.  Marland Kitchen b complex vitamins tablet Take 1 tablet by mouth daily.  . Carbidopa-Levodopa ER (SINEMET CR) 25-100 MG tablet controlled release Take 1 tablet by mouth 2 (two) times daily.  . citalopram (CELEXA) 10 MG tablet Take 1 tablet (10 mg total) by mouth 2 (two) times daily.  . Colchicine (MITIGARE) 0.6 MG CAPS Two tablets today, then 1 tablet daily  . metoprolol succinate (TOPROL-XL) 25 MG 24 hr tablet Take 25 mg by mouth daily.  . midodrine (PROAMATINE) 5 MG tablet Take 1 tablet (5 mg total) by mouth 3 (three) times daily with meals.  . Multiple Vitamin (MULTIVITAMIN) tablet Take 1 tablet by mouth daily.    . nitroGLYCERIN (NITROSTAT) 0.4 MG SL tablet Place 1 tablet (0.4 mg total) under the tongue every 5 (five) minutes as needed for chest pain.  . Probiotic Product (ALIGN) 4 MG CAPS Take 4 mg by mouth daily.  . rosuvastatin (CRESTOR) 10 MG tablet Take 1 tablet (10 mg total) by mouth daily.  . sodium bicarbonate 650 MG tablet Take 650 mg by mouth 2 (two) times daily.  . Vitamin D, Ergocalciferol, (DRISDOL) 50000 units CAPS capsule TAKE 1 CAPSULE ONCE A WEEK  . citalopram (CELEXA) 10 MG tablet Take 1 tablet (10 mg total) by mouth 2 (two) times daily.   No facility-administered encounter medications on file as of 11/26/2016.     Allergies: Lipitor [atorvastatin calcium]; Nsaids; and Statins  Social History   Social History  . Marital status: Married    Spouse name: N/A  . Number of children: 3  . Years of education: N/A   Occupational History  . minister-retired    Social History Main Topics  . Smoking status: Never Smoker  . Smokeless tobacco: Never Used  . Alcohol use No  . Drug use: No  . Sexual activity: Not on file   Other Topics Concern  . Not on file   Social History Narrative  . No narrative on file     Family History  Problem Relation Age of Onset  . Ovarian cancer Mother   . Prostate cancer Father     Review of Systems: A 12-system review of systems was performed and was negative except as noted in the HPI.  --------------------------------------------------------------------------------------------------  Physical Exam: BP 130/62 (BP Location: Left Arm, Patient Position: Sitting, Cuff Size: Normal)   Pulse 69   Ht 6\' 1"  (1.854 m)   Wt 176 lb 8 oz (80.1 kg)   BMI 23.29 kg/m   Position Blood pressure (mmHg) Heart rate (bpm)  Lying 171/80 86  Sitting 129/80 93  Standing 119/74 99  Standing (3 minutes) 115/77 106   General:  Elderly man, seated comfortably in a wheelchair. He is accompanied by his wife and daughter. HEENT: No conjunctival pallor or scleral icterus.  Moist mucous membranes.  OP clear. Neck: Supple without lymphadenopathy, thyromegaly, JVD, or HJR.  Lungs: Normal work of breathing.  Clear to auscultation bilaterally without wheezes or crackles. Heart: Regular rate and rhythm without murmurs, rubs, or gallops.  Non-displaced PMI. Abd: Bowel sounds present.  Soft, NT/ND without hepatosplenomegaly Ext: No lower extremity edema with compression stockings in place. Radial, PT, and DP pulses are 2+ bilaterally. Skin: warm and dry without rash  EKG:  Normal sinus rhythm with left axis deviation left bundle branch block. No significant  change since prior tracing on 09/24/16 (I have personally reviewed both tracings).  Lab Results  Component Value Date   WBC 6.5 06/27/2016   HGB 9.3 (L) 06/27/2016   HCT 27.2 (L) 06/27/2016   MCV 97.0 06/27/2016   PLT 129 (L) 06/27/2016    Lab Results  Component Value Date   NA 138 06/27/2016   K 3.7 06/27/2016   CL 106 06/27/2016   CO2 24 06/27/2016   BUN 37 (H) 06/27/2016   CREATININE 1.74 (H) 06/27/2016   GLUCOSE 101 (H) 06/27/2016   ALT 18 06/05/2016    Lab Results  Component Value Date   CHOL 142 06/05/2016    HDL 46 06/05/2016   LDLCALC 72 06/05/2016   TRIG 118 06/05/2016   CHOLHDL 3.1 06/05/2016    --------------------------------------------------------------------------------------------------  ASSESSMENT AND PLAN: Severe systolic heart failure due to presumed ischemic cardiomyopathy: [name] remains euvolemic on exam. He continues to have considerable fatigue, consistent with NYHA class III heart failure. However, his fatigue could certainly be multifactorial, including his Parkinsons' symptoms and underlying depression. We have agreed to try switching metoprolol succinate to bisoprolol 2.5 mg daily. We will also discontinue rosuvastatin, given his fatigue and pain involving the right thigh. [name]'s orthostatic hypotension and CKD preclude addition of ACEI/ARB and spironolactone. We will continue to forgo cardiac catheterization, I feel that the risks of such a procedure outweigh potential benefits in the setting of [name] advanced age and comorbidities.  Coronary artery disease: No chest pain reported by the patient. We will change metoprolol and discontinue rosuvastatin, as above.  Orthostatic lightheadedness/hypotension: Symptoms are stable. Seated blood pressure was normal today, though orthostatic hypotension has been demonstrated at multiple prior visits. We will continue with midodrine and switch metoprolol to bisoprolol, as above. He should contact us if his lightheadedness worsens. We will continue to avoid fludrocortisone due to his advanced systolic heart failure and chronic kidney disease.  Follow-up: Return to clinic in 1 month.  Nelva Bush, MD 11/26/2016 8:57 PM

## 2016-11-26 NOTE — Patient Instructions (Signed)
Medication Instructions:  Your physician has recommended you make the following change in your medication:  1- STOP TAKING Metoprolol. 2- STOP TAKING Crestor. 3- START TAKING Bisoprolol 2.5 mg (1/2 tablet) by mouth once a day.   Labwork: none  Testing/Procedures: none  Follow-Up: Your physician recommends that you schedule a follow-up appointment in: [place].   If you need a refill on your cardiac medications before your next appointment, please call your pharmacy.

## 2016-11-28 ENCOUNTER — Ambulatory Visit
Admission: RE | Admit: 2016-11-28 | Discharge: 2016-11-28 | Disposition: A | Discharge status: Home / Self Care | Payer: Medicare Other | Source: Ambulatory Visit | Attending: Family Medicine | Admitting: Family Medicine

## 2016-11-28 ENCOUNTER — Encounter: Payer: Self-pay | Admitting: Family Medicine

## 2016-11-28 ENCOUNTER — Ambulatory Visit (INDEPENDENT_AMBULATORY_CARE_PROVIDER_SITE_OTHER): Payer: Medicare Other | Admitting: Family Medicine

## 2016-11-28 VITALS — BP 134/72 | HR 68 | Temp 98.6°F | Resp 20 | Wt 178.0 lb

## 2016-11-28 DIAGNOSIS — I255 Ischemic cardiomyopathy: Secondary | ICD-10-CM

## 2016-11-28 DIAGNOSIS — R059 Cough, unspecified: Secondary | ICD-10-CM

## 2016-11-28 DIAGNOSIS — R05 Cough: Secondary | ICD-10-CM

## 2016-11-28 NOTE — Patient Instructions (Signed)
Go to the [facility] on [address] for chest Xray  

## 2016-11-28 NOTE — Progress Notes (Signed)
Patient: Keith Woods Male    DOB: May 21, 1926   83 y.o.   MRN: [id number] Visit Date: 11/28/2016  Today's Provider: [name], MD   Chief Complaint  Patient presents with  . Cough   Subjective:    HPI Patient comes in today c/o dry cough for at least 2 months. Patient reports that it gets worse when he takes a deep breath. Patient denies any fever, nasal congestion, headaches, or body aches. Patient does mention that he has PND that comes and goes. He does not take anything OTC for the symptoms.      Cough occurs during daytime and at night when he is in bed. Denies heartburn, dysphagia, sinus and nasal congestion/drainage. Never productive. No dyspnea. Has not taken any OTC medications.      Allergies  Allergen Reactions  . Lipitor [Atorvastatin Calcium]   . Nsaids   . Statins      Current Outpatient Prescriptions:  .  allopurinol (ZYLOPRIM) 100 MG tablet, take 2 tablets by mouth once daily, Disp: 60 tablet, Rfl: 5 .  aspirin EC 81 MG EC tablet, Take 1 tablet (81 mg total) by mouth daily., Disp: 30 tablet, Rfl: 0 .  b complex vitamins tablet, Take 1 tablet by mouth daily., Disp: , Rfl:  .  bisoprolol (ZEBETA) 5 MG tablet, Take 0.5 tablets (2.5 mg total) by mouth daily., Disp: 45 tablet, Rfl: 3 .  Carbidopa-Levodopa ER (SINEMET CR) 25-100 MG tablet controlled release, Take 1 tablet by mouth 2 (two) times daily., Disp: 60 tablet, Rfl: 0 .  citalopram (CELEXA) 10 MG tablet, Take 1 tablet (10 mg total) by mouth 2 (two) times daily., Disp: 180 tablet, Rfl: 4 .  Colchicine (MITIGARE) 0.6 MG CAPS, Two tablets today, then 1 tablet daily, Disp: 30 capsule, Rfl: 3 .  midodrine (PROAMATINE) 5 MG tablet, Take 1 tablet (5 mg total) by mouth 3 (three) times daily with meals., Disp: 270 tablet, Rfl: 2 .  Multiple Vitamin (MULTIVITAMIN) tablet, Take 1 tablet by mouth daily.  , Disp: , Rfl:  .  nitroGLYCERIN (NITROSTAT) 0.4 MG SL tablet, Place 1 tablet (0.4 mg total) under  the tongue every 5 (five) minutes as needed for chest pain., Disp: 100 tablet, Rfl: 3 .  Probiotic Product (ALIGN) 4 MG CAPS, Take 4 mg by mouth daily., Disp: , Rfl:  .  sodium bicarbonate 650 MG tablet, Take 650 mg by mouth 2 (two) times daily., Disp: , Rfl: 0 .  Vitamin D, Ergocalciferol, (DRISDOL) 50000 units CAPS capsule, TAKE 1 CAPSULE ONCE A WEEK, Disp: 12 capsule, Rfl: 4  Review of Systems  Constitutional: Negative.   Respiratory: Positive for cough and shortness of breath. Negative for choking, chest tightness, wheezing and stridor.   Cardiovascular: Negative for chest pain, palpitations and leg swelling.  Neurological: Negative.     Social History  Substance Use Topics  . Smoking status: Never Smoker  . Smokeless tobacco: Never Used  . Alcohol use No   Objective:   BP 134/72 (BP Location: Left Arm, Patient Position: Sitting, Cuff Size: Normal)   Pulse 68   Temp 98.6 F (37 C)   Resp 20   Wt 178 lb (80.7 kg)   SpO2 97%   BMI 23.48 kg/m     Physical Exam  General Appearance:    Alert, cooperative, no distress  HENT:   ENT exam normal, no neck nodes or sinus tenderness  Eyes:    PERRL, conjunctiva/corneas  clear, EOM's intact       Lungs:     Clear to auscultation bilaterally, respirations unlabored  Heart:    Regular rate and rhythm  Neurologic:   Awake, alert, oriented x 3. No apparent focal neurological           defect.           Assessment & Plan:     1. Cough x 86months Discussed common causes of unexplained chronic cough including asthma, allergies, GERD, post nasal drainage. He denies any signs of dysphagia or aspiration. He does have known history of systolic HF, but does have signs of acute exacerbation. Is not on any ACEI or ARB.  - DG Chest 2 View; Future  Consider trial of Singulair if xr normal.       [name], MD  [facility]

## 2016-11-29 ENCOUNTER — Telehealth: Payer: Self-pay

## 2016-11-29 DIAGNOSIS — R05 Cough: Secondary | ICD-10-CM

## 2016-11-29 DIAGNOSIS — R053 Chronic cough: Secondary | ICD-10-CM

## 2016-11-29 MED ORDER — MONTELUKAST SODIUM 10 MG PO TABS: 10.0000 mg | ORAL_TABLET | Freq: Every day | ORAL | 3 refills | Status: DC

## 2016-11-29 NOTE — Telephone Encounter (Signed)
-----   Message from [name], MD sent at 11/29/2016  8:21 AM EDT ----- Xray of chest and lungs is normal. Recommend trial of Singulair 10mg  one tablet daily for chronic cough, #30, rf x 3. Call if cough is not much better in 2-3 weeks.

## 2016-11-29 NOTE — Telephone Encounter (Signed)
Patient advised and agrees to treatment plan. Prescription sent into pharmacy.

## 2016-12-03 DIAGNOSIS — G20 Parkinson's disease: Secondary | ICD-10-CM | POA: Diagnosis not present

## 2016-12-03 DIAGNOSIS — M79661 Pain in right lower leg: Secondary | ICD-10-CM | POA: Diagnosis not present

## 2016-12-08 ENCOUNTER — Telehealth: Payer: Self-pay | Admitting: Family Medicine

## 2016-12-08 NOTE — Telephone Encounter (Signed)
Mel with [name] needs verbal to continue PT 1 for 2 weeks to address balance and fall prevention  [phone]  thanks [employer]

## 2016-12-08 NOTE — Telephone Encounter (Signed)
Please advise 

## 2016-12-08 NOTE — Telephone Encounter (Signed)
Mel was notified.

## 2016-12-08 NOTE — Telephone Encounter (Signed)
OK 

## 2016-12-10 DIAGNOSIS — G20 Parkinson's disease: Secondary | ICD-10-CM | POA: Diagnosis not present

## 2016-12-10 DIAGNOSIS — M79661 Pain in right lower leg: Secondary | ICD-10-CM | POA: Diagnosis not present

## 2016-12-17 ENCOUNTER — Other Ambulatory Visit: Payer: Self-pay | Admitting: Family Medicine

## 2016-12-17 DIAGNOSIS — G20 Parkinson's disease: Secondary | ICD-10-CM | POA: Diagnosis not present

## 2016-12-17 DIAGNOSIS — M79661 Pain in right lower leg: Secondary | ICD-10-CM | POA: Diagnosis not present

## 2016-12-31 ENCOUNTER — Encounter: Payer: Self-pay | Admitting: Internal Medicine

## 2016-12-31 ENCOUNTER — Ambulatory Visit (INDEPENDENT_AMBULATORY_CARE_PROVIDER_SITE_OTHER): Payer: Medicare Other | Admitting: Internal Medicine

## 2016-12-31 VITALS — BP 138/70 | HR 61 | Ht 73.0 in | Wt 174.8 lb

## 2016-12-31 DIAGNOSIS — I255 Ischemic cardiomyopathy: Secondary | ICD-10-CM

## 2016-12-31 DIAGNOSIS — M79604 Pain in right leg: Secondary | ICD-10-CM | POA: Diagnosis not present

## 2016-12-31 DIAGNOSIS — I5022 Chronic systolic (congestive) heart failure: Secondary | ICD-10-CM

## 2016-12-31 DIAGNOSIS — I2510 Atherosclerotic heart disease of native coronary artery without angina pectoris: Secondary | ICD-10-CM | POA: Diagnosis not present

## 2016-12-31 DIAGNOSIS — I951 Orthostatic hypotension: Secondary | ICD-10-CM | POA: Diagnosis not present

## 2016-12-31 NOTE — Progress Notes (Signed)
Follow-up Outpatient Visit Date: 12/31/2016  Chief Complaint: Follow-up fatigue and lightheadedness  HPI:  Keith Woods is a 83 y.o. year-old male with history of chronic systolic heart failure with severely reduced LVEF (presumed ischemic based on abnormal myocardial perfusion stress test in 03/2016), stroke, hypertension, hyperlipidemia, recurrent syncope, chronic kidney disease stage III, and Parkinson's symptoms, who presents for follow-up of chronic systolic heart failure complicated by orthostatic hypotension and lightheadedness. The last saw Keith Woods on 11/26/16, at which time he had not had any more falls or episodes of passing out. He continued to remain fatigue and was frustrated by his inability to drive. We agreed to switch from carvedilol to bisoprolol as well as discontinue statin therapy to see if this improved his symptoms. Today, Keith Woods reports feeling about the same. However, his daughter, who accompanies Keith Woods today, notes that his myalgias and fatigue seemed to be a little bit better with the aforementioned medication changes. Keith Woods biggest complaint is of pain in the right thigh. At times, it feels as though his leg buckles. He is unsure if it is due to instability or related to pain when bearing weight. He denies trauma to the area. He has not fallen.  Since our last visit, Keith Woods has not had any chest pain. He also denies shortness of breath, palpitations, orthopnea, PND, and edema. He continues to have occasional brief episodes of lightheadedness, though he is able to steady himself and take his time prompt resolution of the symptoms. Interestingly, he also becomes dizzy when he rolls over in bed.  --------------------------------------------------------------------------------------------------  Cardiovascular History & Procedures: Cardiovascular Problems:  Severe systolic heart failure (presumed ischemic)  Stroke  Orthostatic hypotension  Risk  Factors:  Known CAD, hypertension, hyperlipidemia, age greater than 12, and male gender  Cath/PCI:  LHC (08/28/06): LMCA normal. LAD with mild luminal irregularities. Moderate sized ramus with mild luminal irregularities. Codominant LCx with mild luminal irregularities. Small moderate sized codominant RCA with mild luminal irregularities. LVEF 60%.  CV Surgery:  None  EP Procedures and Devices:  None  Non-Invasive Evaluation(s):  Transthoracic echocardiogram (03/08/16): Normal LV size with severely reduced contraction (EF 25-30%) with diffuse hypokinesis. Grade 1 diastolic dysfunction. Mild MR. Normal RV size and function. Trivial TR.  Pharmacologic myocardial perfusion stress test (04/10/16): Large, severe inferior and apical defect consistent with prior infarction. No evidence of ischemia. LVEF 26%.  Limited echo (06/24/16): Normal LV size and wall thickness with LVEF of 25-30%. Mild aortic regurgitation. Normal RV size and function.  Recent CV Pertinent Labs: Lab Results  Component Value Date   CHOL 142 06/05/2016   HDL 46 06/05/2016   LDLCALC 72 06/05/2016   TRIG 118 06/05/2016   CHOLHDL 3.1 06/05/2016   CHOLHDL 5.2 03/09/2016   INR 1.02 03/08/2016   K 3.7 06/27/2016   MG 1.8 06/25/2016   BUN 37 (H) 06/27/2016   BUN 26 02/05/2016   CREATININE 1.74 (H) 06/27/2016    Past medical and surgical history were reviewed and updated in EPIC.   Outpatient Encounter Prescriptions as of 12/31/2016  Medication Sig  . allopurinol (ZYLOPRIM) 100 MG tablet take 2 tablets by mouth once daily  . aspirin EC 81 MG EC tablet Take 1 tablet (81 mg total) by mouth daily.  Marland Kitchen b complex vitamins tablet Take 1 tablet by mouth daily.  . bisoprolol (ZEBETA) 5 MG tablet Take 0.5 tablets (2.5 mg total) by mouth daily.  . Carbidopa-Levodopa ER (SINEMET CR) 25-100 MG tablet controlled release  Take 1 tablet by mouth 2 (two) times daily.  . citalopram (CELEXA) 10 MG tablet Take 1 tablet (10 mg  total) by mouth 2 (two) times daily.  . Colchicine (MITIGARE) 0.6 MG CAPS Two tablets today, then 1 tablet daily  . midodrine (PROAMATINE) 5 MG tablet Take 1 tablet (5 mg total) by mouth 3 (three) times daily with meals.  . montelukast (SINGULAIR) 10 MG tablet Take 1 tablet (10 mg total) by mouth daily.  . Multiple Vitamin (MULTIVITAMIN) tablet Take 1 tablet by mouth daily.    . nitroGLYCERIN (NITROSTAT) 0.4 MG SL tablet Place 1 tablet (0.4 mg total) under the tongue every 5 (five) minutes as needed for chest pain.  . Probiotic Product (ALIGN) 4 MG CAPS Take 4 mg by mouth daily.  . sodium bicarbonate 650 MG tablet Take 650 mg by mouth 2 (two) times daily.  . Vitamin D, Ergocalciferol, (DRISDOL) 50000 units CAPS capsule TAKE 1 CAPSULE ONCE A WEEK   No facility-administered encounter medications on file as of 12/31/2016.     Allergies: Lipitor [atorvastatin calcium]; Nsaids; and Statins  Social History   Social History  . Marital status: Married    Spouse name: N/A  . Number of children: 3  . Years of education: N/A   Occupational History  . minister-retired    Social History Main Topics  . Smoking status: Never Smoker  . Smokeless tobacco: Never Used  . Alcohol use No  . Drug use: No  . Sexual activity: Not on file   Other Topics Concern  . Not on file   Social History Narrative  . No narrative on file    Family History  Problem Relation Age of Onset  . Ovarian cancer Mother   . Prostate cancer Father     Review of Systems: A 12-system review of systems was performed and was negative except as noted in the HPI.  --------------------------------------------------------------------------------------------------  Physical Exam: BP 138/70 (BP Location: Right Arm, Patient Position: Sitting, Cuff Size: Normal)   Pulse 61   Ht 6\' 1"  (1.854 m)   Wt 174 lb 12 oz (79.3 kg)   BMI 23.06 kg/m    General:  Thin, elderly man, seated comfortably in a wheelchair. He is  accompanied by his daughter. HEENT: No conjunctival pallor or scleral icterus.  Moist mucous membranes.  OP clear. Neck: Supple without lymphadenopathy, thyromegaly, JVD, or HJR. Lungs: Normal work of breathing.  Clear to auscultation bilaterally without wheezes or crackles. Heart: Distant heart sounds. Regular rate and rhythm without murmurs, rubs, or gallops.  Non-displaced PMI. Abd: Bowel sounds present.  Soft, NT/ND without hepatosplenomegaly Ext: No lower extremity edema.  Radial, PT, and DP pulses are 2+ bilaterally. No right knee joint effusion or deformity. No tenderness along right thigh or knee. Normal right knee passive range of motion. Skin: Warm and dry without rash.  Lab Results  Component Value Date   WBC 6.5 06/27/2016   HGB 9.3 (L) 06/27/2016   HCT 27.2 (L) 06/27/2016   MCV 97.0 06/27/2016   PLT 129 (L) 06/27/2016    Lab Results  Component Value Date   NA 138 06/27/2016   K 3.7 06/27/2016   CL 106 06/27/2016   CO2 24 06/27/2016   BUN 37 (H) 06/27/2016   CREATININE 1.74 (H) 06/27/2016   GLUCOSE 101 (H) 06/27/2016   ALT 18 06/05/2016    Lab Results  Component Value Date   CHOL 142 06/05/2016   HDL 46 06/05/2016   LDLCALC  72 06/05/2016   TRIG 118 06/05/2016   CHOLHDL 3.1 06/05/2016    --------------------------------------------------------------------------------------------------  ASSESSMENT AND PLAN: Chronic systolic heart failure due to presumed ischemic cardiomyopathy: Keith Woods appears euvolemic on exam today. This difficult to assess his functional status due to deconditioning, mobility issues, and right leg pain. Overall, he seems unchanged are actually slightly better since her last visit. We will not make any medication changes at this time. As before, given his advanced age and other comorbidities, we will forego further ischemia evaluation or consideration for ICD.  Coronary artery disease: No chest pain since her last visit. We will continue  with low-dose aspirin and bisoprolol for secondary prevention. We will continue to hold statin therapy, given histories of myalgias but improved with withdrawal of statin treatment.  Orthostatic lightheadedness/hypotension: Blood pressure is normal today. Keith Woods continues to have occasional dizziness, some of which sounds like orthostatic lightheadedness and some of which could be vertigo. We will not make any medication changes at this time. Keith Woods would like to resume driving. If he remains without syncopal episodes through the [place] of [place], there would be no absolute contraindication to driving from a cardiac standpoint, as it will have been 6 months since his last syncopal episode. However, given his numerous comorbidities, underlying neurologic illness, and frailty/deconditioning, I would be very hesitant for him to drive. I advised him to speak with his other medical providers as well before getting behind the wheel.  Right leg pain Pain is most suspicious for muscle skeletal etiology. No obvious cause is identified on exam today. I encouraged Keith Woods to follow-up with his PCP for further evaluation.  Follow-up: Return to clinic in 3 months.  Nelva Bush, MD 01/01/2017 8:03 AM

## 2016-12-31 NOTE — Patient Instructions (Signed)
Medication Instructions:  Your physician recommends that you continue on your current medications as directed. Please refer to the Current Medication list given to you today.   Labwork: NONE  Testing/Procedures: NONE  Follow-Up: Your physician recommends that you schedule a follow-up appointment in: 3 MONTHS WITH DR END.  If you need a refill on your cardiac medications before your next appointment, please call your pharmacy.

## 2017-01-01 ENCOUNTER — Encounter: Payer: Self-pay | Admitting: Internal Medicine

## 2017-01-01 DIAGNOSIS — I5042 Chronic combined systolic (congestive) and diastolic (congestive) heart failure: Secondary | ICD-10-CM | POA: Insufficient documentation

## 2017-01-02 ENCOUNTER — Ambulatory Visit (INDEPENDENT_AMBULATORY_CARE_PROVIDER_SITE_OTHER): Payer: Medicare Other | Admitting: Family Medicine

## 2017-01-02 ENCOUNTER — Encounter: Payer: Self-pay | Admitting: Family Medicine

## 2017-01-02 VITALS — BP 138/72 | HR 72 | Temp 97.3°F | Resp 16 | Wt 177.0 lb

## 2017-01-02 DIAGNOSIS — R296 Repeated falls: Secondary | ICD-10-CM

## 2017-01-02 DIAGNOSIS — G20 Parkinson's disease: Secondary | ICD-10-CM | POA: Diagnosis not present

## 2017-01-02 DIAGNOSIS — I255 Ischemic cardiomyopathy: Secondary | ICD-10-CM

## 2017-01-02 DIAGNOSIS — M1611 Unilateral primary osteoarthritis, right hip: Secondary | ICD-10-CM

## 2017-01-02 DIAGNOSIS — M79604 Pain in right leg: Secondary | ICD-10-CM | POA: Diagnosis not present

## 2017-01-02 MED ORDER — TRAMADOL HCL 50 MG PO TABS
50.0000 mg | ORAL_TABLET | Freq: Three times a day (TID) | ORAL | 0 refills | Status: DC | PRN
Start: 2017-01-02 — End: 2017-01-26

## 2017-01-02 NOTE — Progress Notes (Addendum)
Patient: Keith Woods Male    DOB: 1925-10-20   83 y.o.   MRN: [id number] Visit Date: 01/02/2017  Today's Provider: [name], MD   Chief Complaint  Patient presents with  . Leg Pain   Subjective:    HPI Patient presents today for intermittent right leg pain for several years. Pain comes and goes, but when it flares up he gets weak in his right leg and sometimes falls. He cannot take NSAIDs due to CKD. He Has had occasional ketorolac injection which usually helps for a few days. He  States pain is no worse or more frequent now than usual, but is concerned that he thinks it contributed to difficulty with gait and balance and contributing to his falls. He has had several rounds of home PY for balance and gait training, but doesn't feel it helps much with his back. He had thigh and hip xrays in October 2016 showing moderate arthritis of left hip, but normal thigh.     Allergies  Allergen Reactions  . Lipitor [Atorvastatin Calcium]   . Nsaids   . Statins      Current Outpatient Prescriptions:  .  allopurinol (ZYLOPRIM) 100 MG tablet, take 2 tablets by mouth once daily, Disp: 60 tablet, Rfl: 5 .  aspirin EC 81 MG EC tablet, Take 1 tablet (81 mg total) by mouth daily., Disp: 30 tablet, Rfl: 0 .  b complex vitamins tablet, Take 1 tablet by mouth daily., Disp: , Rfl:  .  bisoprolol (ZEBETA) 5 MG tablet, Take 0.5 tablets (2.5 mg total) by mouth daily., Disp: 45 tablet, Rfl: 3 .  Carbidopa-Levodopa ER (SINEMET CR) 25-100 MG tablet controlled release, Take 1 tablet by mouth 2 (two) times daily., Disp: 60 tablet, Rfl: 0 .  citalopram (CELEXA) 10 MG tablet, Take 1 tablet (10 mg total) by mouth 2 (two) times daily., Disp: 180 tablet, Rfl: 4 .  Colchicine (MITIGARE) 0.6 MG CAPS, Two tablets today, then 1 tablet daily, Disp: 30 capsule, Rfl: 3 .  midodrine (PROAMATINE) 5 MG tablet, Take 1 tablet (5 mg total) by mouth 3 (three) times daily with meals., Disp: 270 tablet, Rfl: 2 .   montelukast (SINGULAIR) 10 MG tablet, Take 1 tablet (10 mg total) by mouth daily., Disp: 30 tablet, Rfl: 3 .  Multiple Vitamin (MULTIVITAMIN) tablet, Take 1 tablet by mouth daily.  , Disp: , Rfl:  .  nitroGLYCERIN (NITROSTAT) 0.4 MG SL tablet, Place 1 tablet (0.4 mg total) under the tongue every 5 (five) minutes as needed for chest pain., Disp: 100 tablet, Rfl: 3 .  Probiotic Product (ALIGN) 4 MG CAPS, Take 4 mg by mouth daily., Disp: , Rfl:  .  sodium bicarbonate 650 MG tablet, Take 650 mg by mouth 2 (two) times daily., Disp: , Rfl: 0 .  Vitamin D, Ergocalciferol, (DRISDOL) 50000 units CAPS capsule, TAKE 1 CAPSULE ONCE A WEEK, Disp: 12 capsule, Rfl: 4  Patient Active Problem List   Diagnosis Date Noted  . Chronic systolic heart failure ([place]) 01/01/2017  . Depression, recurrent ([place]) 09/23/2016  . Blood in urine 06/30/2016  . Orthostatic hypotension 06/24/2016  . Cardiomyopathy, ischemic 05/08/2016  . Severe left ventricular systolic dysfunction 51/09/5850  . Weakness of both legs 03/30/2016  . History of CVA (cerebrovascular accident) 03/08/2016  . Frequent falls 02/15/2016  . Parkinson's disease ([place]) 02/15/2016  . PMR (polymyalgia rheumatica) ([facility]) 12/13/2015  . Cerebral microvascular disease 12/13/2015  . Vitamin D deficiency 10/31/2015  .  Anemia 10/31/2015  . Syncope 10/15/2015  . Gout 08/15/2015  . Fatigue 07/08/2015  . Right leg pain 05/18/2015  . Acute gout 05/18/2015  . Wrist pain 03/15/2015  . Chronic kidney disease 02/02/2015  . Vertigo 02/02/2015  . Hearing loss 02/02/2015  . H/O malignant neoplasm of prostate 12/06/2012  . LBBB (left bundle branch block)   . Labile hypertension   . Coronary artery disease   . Hyperlipidemia    Patient Care Team    Relationship Specialty Notifications Start End  [name], MD PCP - General Family Medicine  04/23/11    Comment: Merged (Merged)  Martinique, Peter M, MD Consulting Physician Cardiology  02/02/15   Lavonia Dana,  MD Consulting Physician Nephrology  02/02/15   Charlotte Crumb, MD Consulting Physician Orthopedic Surgery  04/06/15     Review of Systems  Constitutional: Positive for fatigue. Negative for appetite change, chills and fever.  Musculoskeletal: Positive for arthralgias and gait problem. Negative for back pain.  Neurological: Positive for dizziness and weakness. Negative for tremors, syncope, numbness and headaches.    Social History  Substance Use Topics  . Smoking status: Never Smoker  . Smokeless tobacco: Never Used  . Alcohol use No   Objective:   BP 138/72 (BP Location: Right Arm, Patient Position: Sitting, Cuff Size: Normal)   Pulse 72   Temp 97.3 F (36.3 C)   Resp 16   Wt 177 lb (80.3 kg)   SpO2 99%   BMI 23.35 kg/m  There were no vitals filed for this visit.   Physical Exam  General appearance: alert, well developed, well nourished, cooperative and in no distress Head: Normocephalic, without obvious abnormality, atraumatic Respiratory: Respirations even and unlabored, normal respiratory rate Extremities: No gross deformities Skin: Skin color, texture, turgor normal. No rashes seen  Psych: Appropriate mood and affect. Neurologic: Mental status: Alert, oriented to person, place, and time, thought content appropriate.     Assessment & Plan:     1. Right leg pain Chronic and intermittent, I suspect this is pain radiating from OA of his hip Not candidate for NSAIDs. Will try prn tramadol.  - Ambulatory referral to Orthopedic Surgery  2. Frequent falls Multifactorial. May be partially due to leg pain and OA of kip.   3. Primary osteoarthritis of right hip  - Ambulatory referral to Orthopedic Surgery  4. Parkinson's disease ([place]) Continue routine follow up Dr. Melrose Nakayama.    He has significant mobility limitations and requires home health services. Needs home therapy for balance and gait training. Unable to perform ADLs without assistance which his wife is  physically unable to provide.       [name], MD  [facility]   Addendum 03/25/2017 Received call by patient's daughter on 03-30-2017 reports that he is having episodes of weakness and having falls even when using walker. He is unable to safely perform ADLs without assistance and requires home health services.

## 2017-01-02 NOTE — Progress Notes (Signed)
Patient: Keith Woods Male    DOB: 02-Apr-1926   83 y.o.   MRN: [id number] Visit Date: 01/02/2017  Today's Provider: [name], MD   Chief Complaint  Patient presents with  . Leg Pain   Subjective:    Leg Pain   There was no injury mechanism. The pain is present in the right leg. The quality of the pain is described as aching and shooting. The pain is moderate (can be severe). The pain has been intermittent since onset. Associated symptoms include an inability to bear weight and muscle weakness. Pertinent negatives include no numbness or tingling. The symptoms are aggravated by movement and weight bearing. He has tried nothing for the symptoms.       Allergies  Allergen Reactions  . Lipitor [Atorvastatin Calcium]   . Nsaids   . Statins      Current Outpatient Prescriptions:  .  allopurinol (ZYLOPRIM) 100 MG tablet, take 2 tablets by mouth once daily, Disp: 60 tablet, Rfl: 5 .  aspirin EC 81 MG EC tablet, Take 1 tablet (81 mg total) by mouth daily., Disp: 30 tablet, Rfl: 0 .  b complex vitamins tablet, Take 1 tablet by mouth daily., Disp: , Rfl:  .  bisoprolol (ZEBETA) 5 MG tablet, Take 0.5 tablets (2.5 mg total) by mouth daily., Disp: 45 tablet, Rfl: 3 .  Carbidopa-Levodopa ER (SINEMET CR) 25-100 MG tablet controlled release, Take 1 tablet by mouth 2 (two) times daily., Disp: 60 tablet, Rfl: 0 .  citalopram (CELEXA) 10 MG tablet, Take 1 tablet (10 mg total) by mouth 2 (two) times daily., Disp: 180 tablet, Rfl: 4 .  Colchicine (MITIGARE) 0.6 MG CAPS, Two tablets today, then 1 tablet daily, Disp: 30 capsule, Rfl: 3 .  midodrine (PROAMATINE) 5 MG tablet, Take 1 tablet (5 mg total) by mouth 3 (three) times daily with meals., Disp: 270 tablet, Rfl: 2 .  montelukast (SINGULAIR) 10 MG tablet, Take 1 tablet (10 mg total) by mouth daily., Disp: 30 tablet, Rfl: 3 .  Multiple Vitamin (MULTIVITAMIN) tablet, Take 1 tablet by mouth daily.  , Disp: , Rfl:  .  nitroGLYCERIN  (NITROSTAT) 0.4 MG SL tablet, Place 1 tablet (0.4 mg total) under the tongue every 5 (five) minutes as needed for chest pain., Disp: 100 tablet, Rfl: 3 .  Probiotic Product (ALIGN) 4 MG CAPS, Take 4 mg by mouth daily., Disp: , Rfl:  .  sodium bicarbonate 650 MG tablet, Take 650 mg by mouth 2 (two) times daily., Disp: , Rfl: 0 .  Vitamin D, Ergocalciferol, (DRISDOL) 50000 units CAPS capsule, TAKE 1 CAPSULE ONCE A WEEK, Disp: 12 capsule, Rfl: 4  Review of Systems  Constitutional: Negative.   Respiratory: Negative.   Neurological: Negative for tingling and numbness.    Social History  Substance Use Topics  . Smoking status: Never Smoker  . Smokeless tobacco: Never Used  . Alcohol use No   Objective:   BP 138/72 (BP Location: Right Arm, Patient Position: Sitting, Cuff Size: Normal)   Pulse 72   Temp 97.3 F (36.3 C)   Resp 16   Wt 177 lb (80.3 kg)   SpO2 99%   BMI 23.35 kg/m  Vitals:   01/02/17 1649  BP: 138/72  Pulse: 72  Resp: 16  Temp: 97.3 F (36.3 C)  SpO2: 99%  Weight: 177 lb (80.3 kg)     Physical Exam      Assessment & Plan:  [name], MD  [place]

## 2017-01-20 ENCOUNTER — Other Ambulatory Visit: Payer: Self-pay

## 2017-01-20 DIAGNOSIS — M5416 Radiculopathy, lumbar region: Secondary | ICD-10-CM | POA: Diagnosis not present

## 2017-01-20 DIAGNOSIS — M1611 Unilateral primary osteoarthritis, right hip: Secondary | ICD-10-CM | POA: Diagnosis not present

## 2017-01-20 NOTE — Patient Outreach (Signed)
[facility]) Care Management  01/20/2017  Keith Woods 26-Jan-1926 [id number]   Telephone Screen  Referral Date: 01/19/17 Referral Source: EMMI Prevent Referral Reason: "HTN, heart disease" Insurance: Medicare   Outreach attempt # 1  to patient. No answer. RN CM left HIPAA compliant message along with contact info.        Plan: RN CM will make outreach attempt to patient within three business days if no return call.   [name], RN,BSN,CCM [facility] [facility] Telephonic Care Management Coordinator Direct Phone: [phone] Toll Free: [phone] Fax: [phone]

## 2017-01-22 ENCOUNTER — Other Ambulatory Visit: Payer: Self-pay

## 2017-01-22 NOTE — Patient Outreach (Signed)
[facility]) Care Management  01/22/2017  Keith Woods Feb 05, 1926 [id number]   Telephone Screen  Referral Date: 01/19/17 Referral Source: EMMI Prevent Referral Reason: "HTN, heart disease" Insurance: Medicare  Outreach attempt #2 to patient. Spoke with patient. Screening completed.  Social: patient resides in his home along with his spouse. He states that he requires assistance with ADLs/IADLs and spouse helps him out. Patient reports that he is having ongoing issues with difficulty walking along with right thigh pain. He states MDs have advised him not to drive which has been difficult for him to get used to not doing. He relies on his children to take him to medical appts as spouse does not drive as well. He voices that he has had several falls within the past year. Most recent fall was two days go. He denies any injury. DME in the home include cane, walker, scale and BP monitor.   Conditions: Patient has PMH of CKD, CHD, cardiomyopathy. CVA, heart attack, Parkinson's disease, polymyalgia rheumatica, gout. Hearing loss, prostate CA, CAD and HLD. Patient states he hasn't been told he has heart failure but just that "his heart isn't doing what it's supposed to be doing." He is weighing often at home. He reports an unintentional wgt lost from 182 lbs to 176 lbs. Appetite is fair. Patient states he is checking his BP "occasionally" in the home. Although, he voices that BP "runs high often."  Patient states he saw ortho MD on yesterday related to his difficulty walking and had x-rays done. He also states that he has had several HH PT sessions in the past.. He voices having "bad balance issues."  Patient verbalizes that he could uses support and education on managing his conditions.   Medications: Patient reported he takes too many meds to count. He states that he "wants to get rid of some of them."  He voices that it does become difficult at times for him to afford all  his meds. He also verbalizes that he recently lost his bottle of Nitro tabs. Patient reports he has had bottle for several years but states he has not had to take Nitro in the home before. Patient advised to alert MD of this issue.  Appointments: He has appt with PCP on 01/27/17. He goes to see cardiologist(Dr. End) 04/01/17. Patient is also followed by neurologist and nephrologist.   Advance Directives: Patient states he has living will. He is unsure if he provided medical team with copy and advised to f/u regarding the matter.  Consent: [facility] services reviewed and discussed. Patient gave verbal consent for [facility] services.   Plan: RN CM will notify [facility] administrative assistant of case status. RN CM will send referral to [facility] RN for further in home eval/assessment of care needs and management of chronic conditions. RN CM will send [place] referral for polypharmacy med review and possible med assistance.    [name], RN,BSN,CCM [facility] Telephonic Care Management Coordinator Direct Phone: [phone] Toll Free: [phone] Fax: [phone]

## 2017-01-26 ENCOUNTER — Other Ambulatory Visit: Payer: Self-pay | Admitting: Pharmacist

## 2017-01-26 NOTE — Patient Outreach (Signed)
[facility]) Care Management  [place]   01/26/2017  Keith Woods 1926/02/21 [id number]  Subjective: 83 year old male referred to [place] for medication reconciliation and medication assistance by [place].  PMHx includes but is not limited to: Parkinson's, falls, CAD, CVA, chronic kidney disease, polymalgia rheumatica, HTN, HLD, depression, gout, heart failure, anemia and history of prostate cancer.    Successful outreach call to patient this afternoon. HIPAA identifiers verified. Patient states his wife fills up a weekly pill box for him and they prefer to continue this rather than blister-packing services.  Patient states he would like to "get rid of some medications" if possible.  He uses [facility] pharmacy "down the street" rather than mail order.  His insurance is through [employer].  He states he doesn't have any issues affording medications right now.    Objective:   Encounter Medications: Outpatient Encounter Prescriptions as of 01/26/2017  Medication Sig  . allopurinol (ZYLOPRIM) 100 MG tablet take 2 tablets by mouth once daily  . aspirin EC 81 MG EC tablet Take 1 tablet (81 mg total) by mouth daily.  Marland Kitchen b complex vitamins tablet Take 1 tablet by mouth daily.  . bisoprolol (ZEBETA) 5 MG tablet Take 0.5 tablets (2.5 mg total) by mouth daily.  . Carbidopa-Levodopa ER (SINEMET CR) 25-100 MG tablet controlled release Take 1 tablet by mouth 2 (two) times daily.  . citalopram (CELEXA) 10 MG tablet Take 1 tablet (10 mg total) by mouth 2 (two) times daily.  . Colchicine (MITIGARE) 0.6 MG CAPS Two tablets today, then 1 tablet daily  . midodrine (PROAMATINE) 5 MG tablet Take 1 tablet (5 mg total) by mouth 3 (three) times daily with meals.  . Multiple Vitamin (MULTIVITAMIN) tablet Take 1 tablet by mouth daily.    . nitroGLYCERIN (NITROSTAT) 0.4 MG SL tablet Place 1 tablet (0.4 mg total) under the tongue every 5 (five) minutes as  needed for chest pain.  . Probiotic Product (ALIGN) 4 MG CAPS Take 4 mg by mouth daily.  . sodium bicarbonate 650 MG tablet Take 650 mg by mouth 2 (two) times daily.  . Vitamin D, Ergocalciferol, (DRISDOL) 50000 units CAPS capsule TAKE 1 CAPSULE ONCE A WEEK  . montelukast (SINGULAIR) 10 MG tablet Take 1 tablet (10 mg total) by mouth daily. (Patient not taking: Reported on 01/26/2017)  . [DISCONTINUED] traMADol (ULTRAM) 50 MG tablet Take 1 tablet (50 mg total) by mouth every 8 (eight) hours as needed. (Patient not taking: Reported on 01/26/2017)   No facility-administered encounter medications on file as of 01/26/2017.     Functional Status: In your present state of health, do you have any difficulty performing the following activities: 06/24/2016 03/08/2016  Hearing? N Y  Vision? N N  Difficulty concentrating or making decisions? N N  Walking or climbing stairs? N Y  Dressing or bathing? N N  Doing errands, shopping? N Y  Some recent data might be hidden    Fall/Depression Screening: Fall Risk  01/22/2017 05/18/2015  Falls in the past year? Yes Yes  Number falls in past yr: 2 or more 2 or more  Injury with Fall? No Yes  Risk Factor Category  High Fall Risk -  Risk for fall due to : History of fall(s);Impaired balance/gait;Impaired mobility;Medication side effect History of fall(s)  Follow up Falls evaluation completed;Education provided;Follow up appointment -   PHQ 2/9 Scores 01/22/2017 05/18/2015  PHQ - 2 Score 0 0  PHQ- 9 Score - 3      Assessment:  Performed telephonic medication reconciliation with patient.  Counseled patient that all medications appear to have appropriate diagnoses and there are not any duplicate medications.    Drugs sorted by system:  Neurologic/Psychologic: Carbidopa-Levodopa, citalopram  Cardiovascular: Aspirin 81mg , bisoprolol, midodrine, PRN NTG  Gastrointestinal: Probiotic  Endocrine: Allopurinol, colchicine, prednisone taper  Renal: Sodium  bicarbonate  Vitamins/Minerals: Ergocalciferol, vitamin B complex, MVI  Other issues noted:   Patient taking citalopram 10mg  twice daily rather than 20mg  once daily.  Counseled patient to bring this up at office visit next week with Dr. Manuella Ghazi.    Patient no longer taking montelukast or tramadol.     Plan: 1.  Route note to providers regarding citalopram dosing.  2.  Pharmacy will close case as patient and wife do not have any medication cost issues or further questions at this time.   Ralene Bathe, PharmD, [place] [phone]

## 2017-01-27 ENCOUNTER — Encounter: Payer: Self-pay | Admitting: Family Medicine

## 2017-01-27 ENCOUNTER — Ambulatory Visit (INDEPENDENT_AMBULATORY_CARE_PROVIDER_SITE_OTHER): Payer: Medicare Other | Admitting: Family Medicine

## 2017-01-27 ENCOUNTER — Other Ambulatory Visit: Payer: Self-pay | Admitting: *Deleted

## 2017-01-27 ENCOUNTER — Ambulatory Visit: Payer: Medicare Other

## 2017-01-27 VITALS — BP 134/90 | HR 71 | Temp 97.7°F | Resp 16 | Ht 73.0 in | Wt 177.0 lb

## 2017-01-27 DIAGNOSIS — E790 Hyperuricemia without signs of inflammatory arthritis and tophaceous disease: Secondary | ICD-10-CM

## 2017-01-27 DIAGNOSIS — I255 Ischemic cardiomyopathy: Secondary | ICD-10-CM | POA: Diagnosis not present

## 2017-01-27 DIAGNOSIS — Z0000 Encounter for general adult medical examination without abnormal findings: Secondary | ICD-10-CM

## 2017-01-27 DIAGNOSIS — I2510 Atherosclerotic heart disease of native coronary artery without angina pectoris: Secondary | ICD-10-CM

## 2017-01-27 DIAGNOSIS — R0989 Other specified symptoms and signs involving the circulatory and respiratory systems: Secondary | ICD-10-CM | POA: Diagnosis not present

## 2017-01-27 NOTE — Progress Notes (Signed)
Patient: Keith Woods Male    DOB: [birth year]   83 y.o.   MRN: [id number] Visit Date: 01/27/2017  Today's Provider: [name], MD   Chief Complaint  Patient presents with  . Medicare Wellness  . Hypertension  . Hyperlipidemia  . Gout  . Coronary Artery Disease  . Depression   Subjective:    HPI   Hypertension, follow-up:  BP Readings from Last 3 Encounters:  01/27/17 134/90  01/02/17 138/72  12/31/16 138/70    He was last seen for hypertension 3 months ago.   Management since that visit includes; stable. Reinforced importance of drinking fluids.He reports good compliance with treatment. He is not having side effects. none He is not exercising. He is adherent to low salt diet.   Outside blood pressures are not checking. He is experiencing none.  Patient denies none.   Cardiovascular risk factors include none.  Use of agents associated with hypertension: none.   ----------------------------------------------------------------     Lipid/Cholesterol, Follow-up:   Last seen for this 8 months ago.  Management since that visit includes; labs checked. Stopped wellchol and fish oil for the time being.  Since then Dr. Saunders Revel had him stop Rosuvastatin in April due to persistent muscle pain of right thigh. He states pain improved but did not resolved when statin was discontinued. He was also started on prednisone by Dr. Lisette Grinder last week for radicular hip pain and all of his pains of since improved. He states he did have a fall about a week before starting prednisone. He thinks this was related to pain's affect on his walking. He had been getting home PT but this was stopped several months ago.   Last Lipid Panel:    Component Value Date/Time   CHOL 142 06/05/2016 1137   TRIG 118 06/05/2016 1137   HDL 46 06/05/2016 1137   CHOLHDL 3.1 06/05/2016 1137   CHOLHDL 5.2 03/09/2016 0530   VLDL 27 03/09/2016 0530   LDLCALC 72 06/05/2016 1137    He  reports good compliance with treatment. He is not having side effects. none  Wt Readings from Last 3 Encounters:  01/27/17 177 lb (80.3 kg)  01/02/17 177 lb (80.3 kg)  12/31/16 174 lb 12 oz (79.3 kg)    ----------------------------------------------------------------     Allergies  Allergen Reactions  . Lipitor [Atorvastatin Calcium]     Leg pain  . Nsaids   . Statins     Leg pain     Current Outpatient Prescriptions:  .  allopurinol (ZYLOPRIM) 100 MG tablet, take 2 tablets by mouth once daily, Disp: 60 tablet, Rfl: 5 .  aspirin EC 81 MG EC tablet, Take 1 tablet (81 mg total) by mouth daily., Disp: 30 tablet, Rfl: 0 .  b complex vitamins tablet, Take 1 tablet by mouth daily., Disp: , Rfl:  .  bisoprolol (ZEBETA) 5 MG tablet, Take 0.5 tablets (2.5 mg total) by mouth daily., Disp: 45 tablet, Rfl: 3 .  Carbidopa-Levodopa ER (SINEMET CR) 25-100 MG tablet controlled release, Take 1 tablet by mouth 2 (two) times daily., Disp: 60 tablet, Rfl: 0 .  citalopram (CELEXA) 10 MG tablet, Take 1 tablet (10 mg total) by mouth 2 (two) times daily., Disp: 180 tablet, Rfl: 4 .  Colchicine (MITIGARE) 0.6 MG CAPS, Two tablets today, then 1 tablet daily, Disp: 30 capsule, Rfl: 3 .  midodrine (PROAMATINE) 5 MG tablet, Take 1 tablet (5 mg total) by mouth 3 (three) times daily with  meals., Disp: 270 tablet, Rfl: 2 .  montelukast (SINGULAIR) 10 MG tablet, Take 1 tablet (10 mg total) by mouth daily., Disp: 30 tablet, Rfl: 3 .  Multiple Vitamin (MULTIVITAMIN) tablet, Take 1 tablet by mouth daily.  , Disp: , Rfl:  .  nitroGLYCERIN (NITROSTAT) 0.4 MG SL tablet, Place 1 tablet (0.4 mg total) under the tongue every 5 (five) minutes as needed for chest pain., Disp: 100 tablet, Rfl: 3 .  Probiotic Product (ALIGN) 4 MG CAPS, Take 4 mg by mouth daily., Disp: , Rfl:  .  sodium bicarbonate 650 MG tablet, Take 650 mg by mouth 2 (two) times daily., Disp: , Rfl: 0 .  Vitamin D, Ergocalciferol, (DRISDOL) 50000 units  CAPS capsule, TAKE 1 CAPSULE ONCE A WEEK, Disp: 12 capsule, Rfl: 4  Review of Systems  Constitutional: Positive for fatigue. Negative for appetite change, chills and fever.  Respiratory: Negative for chest tightness, shortness of breath and wheezing.   Cardiovascular: Negative for chest pain and palpitations.  Gastrointestinal: Negative for abdominal pain, nausea and vomiting.  Musculoskeletal: Positive for myalgias.  Neurological: Positive for weakness.    Social History  Substance Use Topics  . Smoking status: Never Smoker  . Smokeless tobacco: Never Used  . Alcohol use No   Objective:   BP 134/90 (BP Location: Right Arm, Patient Position: Sitting, Cuff Size: Normal)   Pulse 71   Temp 97.7 F (36.5 C) (Oral)   Resp 16   Ht 6\' 1"  (1.854 m)   Wt 177 lb (80.3 kg)   SpO2 96%   BMI 23.35 kg/m  Vitals:   01/27/17 1417  BP: 134/90  Pulse: 71  Resp: 16  Temp: 97.7 F (36.5 C)  TempSrc: Oral  SpO2: 96%  Weight: 177 lb (80.3 kg)  Height: 6\' 1"  (1.854 m)   Functional Status Survey: Is the patient deaf or have difficulty hearing?: No Does the patient have difficulty seeing, even when wearing glasses/contacts?: No Does the patient have difficulty concentrating, remembering, or making decisions?: No Does the patient have difficulty walking or climbing stairs?: Yes Does the patient have difficulty dressing or bathing?: No Does the patient have difficulty doing errands alone such as visiting a doctor's office or shopping?: Yes  Depression screen [facility] 2/9 01/27/2017 01/22/2017 05/18/2015  Decreased Interest 0 0 0  Down, Depressed, Hopeless 0 0 0  PHQ - 2 Score 0 0 0  Altered sleeping 0 - 1  Tired, decreased energy 3 - 2  Change in appetite 0 - 0  Feeling bad or failure about yourself  0 - 0  Trouble concentrating 0 - 0  Moving slowly or fidgety/restless 0 - 0  Suicidal thoughts 0 - 0  PHQ-9 Score 3 - 3  Difficult doing work/chores Not difficult at all - Not difficult at all      Fall Risk  01/27/2017 01/22/2017 05/18/2015  Falls in the past year? Yes Yes Yes  Number falls in past yr: 2 or more 2 or more 2 or more  Injury with Fall? No No Yes  Risk Factor Category  High Fall Risk High Fall Risk -  Risk for fall due to : - History of fall(s);Impaired balance/gait;Impaired mobility;Medication side effect History of fall(s)  Follow up - Falls evaluation completed;Education provided;Follow up appointment -   Audit-C Alcohol Use Screening  Question Answer Points  How often do you have alcoholic drink? never 0  On days you do drink alcohol, how many drinks do you typically consume?  0 0  How oftey will you drink 6 or more in a total? never 0  Total Score:  0   A score of 3 or more in women, and 4 or more in men indicates increased risk for alcohol abuse, EXCEPT if all of the points are from question 1.    Physical Exam   General Appearance:    Alert, cooperative, no distress  Eyes:    PERRL, conjunctiva/corneas clear, EOM's intact       Lungs:     Clear to auscultation bilaterally, respirations unlabored  Heart:    Regular rate and rhythm  Neurologic:   Awake, alert, oriented x 3. Shuffled gait with generalized LE weakness, but able to ambulate slowly without assistance. .           Assessment & Plan:     1. Medicare annual wellness visit, subsequent Stable.   2. Labile hypertension Stable today.   3. Coronary artery disease involving native coronary artery of native heart without angina pectoris Asymptomatic. Compliant with medication.  Continue aggressive risk factor modification.  Off of rosuvastatin since April to left thigh pain which has improved.  - Lipid panel  4. Hyperuricemia No recent gout symptoms. Tolerating allopurinol well.  - Uric acid       [name], MD  [facility]

## 2017-01-27 NOTE — Patient Outreach (Signed)
Phone call successful, follow up on referral received 01/22/17  from [facility] telephonic RN CM to follow pt with community nurse case management services.   Pt's history includes but not limited to chronic kidney disease, CHF, Hypertension,CVA, Parkinson's, prostate cancer, CAD, gout, multiple falls.  Spoke with pt, HIPAA/identity verified, discussed purpose of call- follow up on referral from [place] CM.  Discussed with pt THN services, benefit of his insurance, home visit no cost  to which pt agreed.    Plan:  As discussed with pt, plan to follow up next week- initial home visit.   Zara Chess.   [place] [facility]  [phone]

## 2017-01-27 NOTE — Progress Notes (Signed)
Patient: Keith Woods, Male    DOB: 06-Aug-1926, 83 y.o.   MRN: [id number] Visit Date: 01/27/2017  Today's Provider: [name], MD   Chief Complaint  Patient presents with  . Medicare Wellness  . Hypertension  . Hyperlipidemia  . Gout  . Coronary Artery Disease  . Depression   Subjective:    Annual wellness visit Keith Woods is a 83 y.o. male. He feels fairly well. He reports exercising none. He reports he is sleeping fairly well.  -----------------------------------------------------------   ROS:  Social History   Social History  . Marital status: Married    Spouse name: N/A  . Number of children: 3  . Years of education: N/A   Occupational History  . minister-retired    Social History Main Topics  . Smoking status: Never Smoker  . Smokeless tobacco: Never Used  . Alcohol use No  . Drug use: No  . Sexual activity: Not on file   Other Topics Concern  . Not on file   Social History Narrative  . No narrative on file    Past Medical History:  Diagnosis Date  . Coronary artery disease    NONOBSTRUCTIVE by cath 2008  . GERD (gastroesophageal reflux disease)   . History of prostate cancer   . History of transient ischemic attack (TIA)   . Hyperlipidemia   . Hypertension   . LBBB (left bundle branch block)   . Stroke ([place]) 02/2016   Hemorrhagic  . Syncope and collapse     Patient Active Problem List   Diagnosis Date Noted  . Chronic systolic heart failure ([place]) 01/01/2017  . Depression, recurrent ([place]) 09/23/2016  . Blood in urine 06/30/2016  . Orthostatic hypotension 06/24/2016  . Cardiomyopathy, ischemic 05/08/2016  . Severe left ventricular systolic dysfunction 17/51/0258  . Weakness of both legs 03/30/2016  . History of CVA (cerebrovascular accident) 03/08/2016  . Frequent falls 02/15/2016  . Parkinson's disease ([place]) 02/15/2016  . PMR (polymyalgia rheumatica) (H[facility]) 12/13/2015  . Cerebral microvascular disease  12/13/2015  . Vitamin D deficiency 10/31/2015  . Anemia 10/31/2015  . Syncope 10/15/2015  . Gout 08/15/2015  . Fatigue 07/08/2015  . Right leg pain 05/18/2015  . Acute gout 05/18/2015  . Wrist pain 03/15/2015  . Chronic kidney disease 02/02/2015  . Vertigo 02/02/2015  . Hearing loss 02/02/2015  . H/O malignant neoplasm of prostate 12/06/2012  . LBBB (left bundle branch block)   . Labile hypertension   . Coronary artery disease   . Hyperlipidemia     Past Surgical History:  Procedure Laterality Date  . CARDIAC CATHETERIZATION  08/28/2006   EF 60%  . TONSILLECTOMY    . TRANSTHORACIC ECHOCARDIOGRAM  10/10/2008   EF 55%    His family history includes Ovarian cancer in his mother; Prostate cancer in his father.    Family Status  Relation Status  . Mother Deceased at age 68  . Father Deceased at age 67  . MGM Deceased  . MGF Deceased  . PGM Deceased  . PGF Deceased     Current Outpatient Prescriptions:  .  allopurinol (ZYLOPRIM) 100 MG tablet, take 2 tablets by mouth once daily, Disp: 60 tablet, Rfl: 5 .  aspirin EC 81 MG EC tablet, Take 1 tablet (81 mg total) by mouth daily., Disp: 30 tablet, Rfl: 0 .  b complex vitamins tablet, Take 1 tablet by mouth daily., Disp: , Rfl:  .  bisoprolol (ZEBETA) 5 MG tablet,  Take 0.5 tablets (2.5 mg total) by mouth daily., Disp: 45 tablet, Rfl: 3 .  Carbidopa-Levodopa ER (SINEMET CR) 25-100 MG tablet controlled release, Take 1 tablet by mouth 2 (two) times daily., Disp: 60 tablet, Rfl: 0 .  citalopram (CELEXA) 10 MG tablet, Take 1 tablet (10 mg total) by mouth 2 (two) times daily., Disp: 180 tablet, Rfl: 4 .  Colchicine (MITIGARE) 0.6 MG CAPS, Two tablets today, then 1 tablet daily, Disp: 30 capsule, Rfl: 3 .  midodrine (PROAMATINE) 5 MG tablet, Take 1 tablet (5 mg total) by mouth 3 (three) times daily with meals., Disp: 270 tablet, Rfl: 2 .  montelukast (SINGULAIR) 10 MG tablet, Take 1 tablet (10 mg total) by mouth daily., Disp: 30 tablet,  Rfl: 3 .  Multiple Vitamin (MULTIVITAMIN) tablet, Take 1 tablet by mouth daily.  , Disp: , Rfl:  .  nitroGLYCERIN (NITROSTAT) 0.4 MG SL tablet, Place 1 tablet (0.4 mg total) under the tongue every 5 (five) minutes as needed for chest pain., Disp: 100 tablet, Rfl: 3 .  Probiotic Product (ALIGN) 4 MG CAPS, Take 4 mg by mouth daily., Disp: , Rfl:  .  sodium bicarbonate 650 MG tablet, Take 650 mg by mouth 2 (two) times daily., Disp: , Rfl: 0 .  Vitamin D, Ergocalciferol, (DRISDOL) 50000 units CAPS capsule, TAKE 1 CAPSULE ONCE A WEEK, Disp: 12 capsule, Rfl: 4   Patient Care Team: [name], MD as PCP - General (Family Medicine) Martinique, Peter M, MD as Consulting Physician (Cardiology) Lavonia Dana, MD as Consulting Physician (Nephrology) Charlotte Crumb, MD as Consulting Physician (Orthopedic Surgery) Lyman Speller, RN as [place] Management      Objective:   Vitals: BP 134/90 (BP Location: Right Arm, Patient Position: Sitting, Cuff Size: Normal)   Pulse 71   Temp 97.7 F (36.5 C) (Oral)   Resp 16   Ht 6\' 1"  (1.854 m)   Wt 177 lb (80.3 kg)   SpO2 96%   BMI 23.35 kg/m   Exam:  Activities of Daily Living In your present state of health, do you have any difficulty performing the following activities: 01/27/2017 06/24/2016  Hearing? N N  Vision? N N  Difficulty concentrating or making decisions? N N  Walking or climbing stairs? Y N  Dressing or bathing? N N  Doing errands, shopping? Y N  Some recent data might be hidden    Fall Risk Assessment Fall Risk  01/27/2017 01/22/2017 05/18/2015  Falls in the past year? Yes Yes Yes  Number falls in past yr: 2 or more 2 or more 2 or more  Injury with Fall? No No Yes  Risk Factor Category  High Fall Risk High Fall Risk -  Risk for fall due to : - History of fall(s);Impaired balance/gait;Impaired mobility;Medication side effect History of fall(s)  Follow up - Falls evaluation completed;Education  provided;Follow up appointment -     Depression Screen PHQ 2/9 Scores 01/27/2017 01/22/2017 05/18/2015  PHQ - 2 Score 0 0 0  PHQ- 9 Score 3 - 3      Assessment & Plan:     Annual Wellness Visit  Reviewed patient's Family Medical History Reviewed and updated list of patient's medical providers Assessment of cognitive impairment was done Assessed patient's functional ability Established a written schedule for health screening [place] Completed and Reviewed  Exercise Activities and Dietary recommendations Goals    None      Immunization History  Administered Date(s) Administered  .  Influenza, High Dose Seasonal PF 05/18/2015, 04/23/2016  . Pneumococcal Conjugate-13 05/18/2015  . Pneumococcal Polysaccharide-23 06/19/2004  . Tdap 05/18/2015    Health Maintenance  Topic Date Due  . INFLUENZA VACCINE  03/11/2017  . TETANUS/TDAP  05/17/2025  . PNA vac Low Risk Adult  Completed     Discussed health benefits of physical activity, and encouraged him to engage in regular exercise appropriate for his age and condition.    ------------------------------------------------------------------------------------------------------------

## 2017-01-28 ENCOUNTER — Ambulatory Visit: Payer: Medicare Other | Admitting: Family Medicine

## 2017-01-28 DIAGNOSIS — I2510 Atherosclerotic heart disease of native coronary artery without angina pectoris: Secondary | ICD-10-CM | POA: Diagnosis not present

## 2017-01-28 DIAGNOSIS — E790 Hyperuricemia without signs of inflammatory arthritis and tophaceous disease: Secondary | ICD-10-CM | POA: Diagnosis not present

## 2017-01-29 ENCOUNTER — Telehealth: Payer: Self-pay | Admitting: *Deleted

## 2017-01-29 DIAGNOSIS — E785 Hyperlipidemia, unspecified: Secondary | ICD-10-CM

## 2017-01-29 LAB — LIPID PANEL
CHOL/HDL RATIO: 6.7 ratio — AB (ref 0.0–5.0)
Cholesterol, Total: 235 mg/dL — ABNORMAL HIGH (ref 100–199)
HDL: 35 mg/dL — ABNORMAL LOW (ref >39)
LDL CALC: 139 mg/dL — AB (ref 0–99)
Triglycerides: 306 mg/dL — ABNORMAL HIGH (ref 0–149)
VLDL Cholesterol Cal: 61 mg/dL — ABNORMAL HIGH (ref 5–40)

## 2017-01-29 LAB — URIC ACID: Uric Acid: 5.2 mg/dL (ref 3.7–8.6)

## 2017-01-30 NOTE — Telephone Encounter (Signed)
Labs ordered.

## 2017-02-03 ENCOUNTER — Other Ambulatory Visit: Payer: Self-pay | Admitting: *Deleted

## 2017-02-03 ENCOUNTER — Encounter: Payer: Self-pay | Admitting: *Deleted

## 2017-02-03 NOTE — Patient Outreach (Signed)
[facility]) Care Management   02/03/2017  Billy Turvey Gorrell 11/27/1925 [id number]  Brody Bonneau Labrada is an 83 y.o. male  Subjective:  Pt reports on recent wellness visit at MD office, aware cholesterol is elevated,  Does not want to think about the numbers but then inquired of RN CM what they were. Pt  Reports does have My chart, was alerted something there, has not looked yet.  Pt  Reports samples of new cholesterol medication are waiting at MD office, has not picked it up  Yet.  RN CM discussed with pt elevated cholesterol and triglycerides put him at higher risk  For stroke.  Pt reports both MI and stroke occurred when asleep.  Spouse reports pt has been  having difficulty walking past few days, to follow up with Orthopedic MD tomorrow/second office visit.  Pt reports initial visit with MD was given a Prednisone taper which helped/completed 6/24, since  being off pain started back.     Objectiive:   Vitals:   02/03/17 1322  BP: 112/70  Pulse: (!) 57  Resp: 12    ROS  Physical Exam  Constitutional: He is oriented to person, place, and time. He appears well-developed and well-nourished.  Cardiovascular: Regular rhythm.   HR 57 today  Respiratory: Effort normal and breath sounds normal.  Musculoskeletal: He exhibits no edema.  Abnormal gait, risk for falls.   Neurological: He is alert and oriented to person, place, and time.  Skin: Skin is warm and dry.  Psychiatric: He has a normal mood and affect. His behavior is normal. Judgment and thought content normal.    Encounter Medications:   Outpatient Encounter Prescriptions as of 02/03/2017  Medication Sig Note  . allopurinol (ZYLOPRIM) 100 MG tablet take 2 tablets by mouth once daily   . aspirin EC 81 MG EC tablet Take 1 tablet (81 mg total) by mouth daily.   Marland Kitchen b complex vitamins tablet Take 1 tablet by mouth daily.   . bisoprolol (ZEBETA) 5 MG tablet Take 0.5 tablets (2.5 mg total) by mouth daily.    . Carbidopa-Levodopa ER (SINEMET CR) 25-100 MG tablet controlled release Take 1 tablet by mouth 2 (two) times daily.   . citalopram (CELEXA) 10 MG tablet Take 1 tablet (10 mg total) by mouth 2 (two) times daily.   . Colchicine (MITIGARE) 0.6 MG CAPS Two tablets today, then 1 tablet daily   . midodrine (PROAMATINE) 5 MG tablet Take 1 tablet (5 mg total) by mouth 3 (three) times daily with meals.   . Multiple Vitamin (MULTIVITAMIN) tablet Take 1 tablet by mouth daily.     . Probiotic Product (ALIGN) 4 MG CAPS Take 4 mg by mouth daily.   . sodium bicarbonate 650 MG tablet Take 650 mg by mouth 2 (two) times daily.   . Vitamin D, Ergocalciferol, (DRISDOL) 50000 units CAPS capsule TAKE 1 CAPSULE ONCE A WEEK   . midodrine (PROAMATINE) 5 MG tablet Take 5 mg by mouth 3 (three) times daily with meals. 02/03/2017: Repeated medication   . montelukast (SINGULAIR) 10 MG tablet Take 1 tablet (10 mg total) by mouth daily. (Patient not taking: Reported on 02/03/2017)   . nitroGLYCERIN (NITROSTAT) 0.4 MG SL tablet Place 1 tablet (0.4 mg total) under the tongue every 5 (five) minutes as needed for chest pain. (Patient not taking: Reported on 02/03/2017) 02/03/2017: Available as needed.    No facility-administered encounter medications on file as of 02/03/2017.     Functional Status:  In your present state of health, do you have any difficulty performing the following activities: 02/03/2017 01/27/2017  Hearing? Y N  Vision? N N  Difficulty concentrating or making decisions? N N  Walking or climbing stairs? Y Y  Dressing or bathing? Y N  Doing errands, shopping? [name]  Preparing Food and eating ? Y -  Using the Toilet? N -  In the past six months, have you accidently leaked urine? N -  Do you have problems with loss of bowel control? N -  Managing your Medications? Y -  Managing your Finances? Y -  Housekeeping or managing your Housekeeping? N -  Some recent data might be hidden    Fall/Depression Screening:     Fall Risk  02/03/2017 01/27/2017 01/22/2017  Falls in the past year? Yes Yes Yes  Number falls in past yr: 2 or more 2 or more 2 or more  Injury with Fall? No No No  Risk Factor Category  - High Fall Risk High Fall Risk  Risk for fall due to : Impaired mobility;Impaired balance/gait - History of fall(s);Impaired balance/gait;Impaired mobility;Medication side effect  Follow up Falls prevention discussed - Falls evaluation completed;Education provided;Follow up appointment   PHQ 2/9 Scores 02/03/2017 01/27/2017 01/22/2017 05/18/2015  PHQ - 2 Score 0 0 0 0  PHQ- 9 Score - 3 - 3    Assessment:  Pleasant 83 year old gentleman, resides with spouse. Daughter Ivin Booty present during  Most of home visit.  Lungs clear, no edema, reported sob/chest pain.  Hyperlipidemia:  Discussed with pt recent cholesterol and triglyceride number as pt requested,     Both elevated from 8 months ago- per pt stopped statin more than a month ago, does not tolerate    Statins well. Emmi information on total cholesterol and triglycerides provided and reviewed.  Safety:  Pt's gait unsteady when ambulating, using a single cane.  Discussed with pt use of walker     For better balance.     Plan:  As discussed with pt, plan to continue to provide community nurse case management services,  Follow up again next month- home visit.           Barrier letter sent by in basket to Dr. Caryn Section, informing of [facility] involvement.            Plan to send today's home visit encounter to Dr. Caryn Section via in basket.               THN CM Care Plan Problem One     Most Recent Value  Care Plan Problem One  Hyperlipidemia   Role Documenting the Problem One  Care Management Coordinator  Care Plan for Problem One  Active  THN Long Term Goal   Pt will see improvement in total cholesterol and triglycerides within the next 60 days   THN Long Term Goal Start Date  02/03/17  Interventions for Problem One Long Term Goal  Provided pt with Emmi  information/reviewed- Total cholesterol, Triglycerides.   THN CM Short Term Goal #1   Pt would start new cholesterol medication, take as ordered for the next 30 days   THN CM Short Term Goal #1 Start Date  02/03/17  Interventions for Short Term Goal #1  Reinforced with pt importance of picking up new cholesterol medications (samples) from MD office, take as ordered- pt voiced understanding.   THN CM Short Term Goal #2   Pt would have better understanding of Low cholesterol diet  in the next 30 days   THN CM Short Term Goal #2 Start Date  02/03/17  Interventions for Short Term Goal #2  Utilizing teach back- reviewed foods high and low in cholesterol, benefits of low cholesterol diet, risks of high cholesterol diet (pt has hx of stroke)      Zara Chess.   [place] [facility]  [phone]

## 2017-02-04 DIAGNOSIS — M545 Low back pain: Secondary | ICD-10-CM | POA: Diagnosis not present

## 2017-02-18 DIAGNOSIS — M5440 Lumbago with sciatica, unspecified side: Secondary | ICD-10-CM | POA: Diagnosis not present

## 2017-02-18 DIAGNOSIS — M47816 Spondylosis without myelopathy or radiculopathy, lumbar region: Secondary | ICD-10-CM | POA: Diagnosis not present

## 2017-02-18 DIAGNOSIS — M545 Low back pain: Secondary | ICD-10-CM | POA: Diagnosis not present

## 2017-02-23 ENCOUNTER — Other Ambulatory Visit: Payer: Self-pay | Admitting: Family Medicine

## 2017-02-23 DIAGNOSIS — F339 Major depressive disorder, recurrent, unspecified: Secondary | ICD-10-CM

## 2017-02-23 DIAGNOSIS — M4726 Other spondylosis with radiculopathy, lumbar region: Secondary | ICD-10-CM | POA: Diagnosis not present

## 2017-02-23 DIAGNOSIS — M5416 Radiculopathy, lumbar region: Secondary | ICD-10-CM | POA: Diagnosis not present

## 2017-02-23 DIAGNOSIS — M5126 Other intervertebral disc displacement, lumbar region: Secondary | ICD-10-CM | POA: Diagnosis not present

## 2017-03-06 ENCOUNTER — Other Ambulatory Visit: Payer: Self-pay | Admitting: *Deleted

## 2017-03-06 MED ORDER — PITAVASTATIN CALCIUM 2 MG PO TABS: 2.0000 mg | ORAL_TABLET | Freq: Every day | ORAL | 0 refills | Status: DC

## 2017-03-06 NOTE — Patient Outreach (Signed)
[facility]) Care Management   03/06/2017  Jaymar Loeber [place] 11-Oct-1925 [id number]  Curby Carswell Khader is an 83 y.o. male  Subjective: Pt reports on recent visit with Orthopedic MD, MRI done, MD sending me to a  Specialist to get a shot (steroid) in the back.  Pt reports has pain in right thigh coming from  Back when walking, no pain when sitting.  Pt reports currently not taking any pain medication, Avoid doing things that cause pain.  Pt reports did not pick up samples of cholesterol  Medication from MD office, forgot to tell daughter, he only drives short distance.  After review  Of recent lipid panel (per spouse request), pt states will  go to Dr. Maralyn Sago office today which  Is a short distance away and pick up cholesterol samples, take as directed.   Objective:  Vitals:   03/06/17 1346  BP: 128/74  Pulse: (!) 59  Resp: 12    ROS  Physical Exam  Constitutional: He is oriented to person, place, and time. He appears well-developed and well-nourished.  Respiratory: Effort normal and breath sounds normal.  GI: Soft. Bowel sounds are normal.  Musculoskeletal: Normal range of motion. He exhibits no edema.  Uses cane when ambulating.   Neurological: He is alert and oriented to person, place, and time.  Skin: Skin is warm and dry.  Psychiatric: He has a normal mood and affect. His behavior is normal. Judgment and thought content normal.    Encounter Medications:   Outpatient Encounter Prescriptions as of 03/06/2017  Medication Sig Note  . allopurinol (ZYLOPRIM) 100 MG tablet take 2 tablets by mouth once daily   . aspirin EC 81 MG EC tablet Take 1 tablet (81 mg total) by mouth daily.   Marland Kitchen b complex vitamins tablet Take 1 tablet by mouth daily.   . bisoprolol (ZEBETA) 5 MG tablet Take 0.5 tablets (2.5 mg total) by mouth daily.   . Carbidopa-Levodopa ER (SINEMET CR) 25-100 MG tablet controlled release Take 1 tablet by mouth 2 (two) times daily.   .  citalopram (CELEXA) 10 MG tablet take 1 tablet by mouth twice a day   . Colchicine (MITIGARE) 0.6 MG CAPS Two tablets today, then 1 tablet daily   . midodrine (PROAMATINE) 5 MG tablet Take 1 tablet (5 mg total) by mouth 3 (three) times daily with meals.   . midodrine (PROAMATINE) 5 MG tablet Take 5 mg by mouth 3 (three) times daily with meals. 02/03/2017: Repeated medication   . montelukast (SINGULAIR) 10 MG tablet Take 1 tablet (10 mg total) by mouth daily. (Patient not taking: Reported on 02/03/2017)   . Multiple Vitamin (MULTIVITAMIN) tablet Take 1 tablet by mouth daily.     . nitroGLYCERIN (NITROSTAT) 0.4 MG SL tablet Place 1 tablet (0.4 mg total) under the tongue every 5 (five) minutes as needed for chest pain. (Patient not taking: Reported on 02/03/2017) 02/03/2017: Available as needed.   . Probiotic Product (ALIGN) 4 MG CAPS Take 4 mg by mouth daily.   . sodium bicarbonate 650 MG tablet Take 650 mg by mouth 2 (two) times daily.   . Vitamin D, Ergocalciferol, (DRISDOL) 50000 units CAPS capsule TAKE 1 CAPSULE ONCE A WEEK    No facility-administered encounter medications on file as of 03/06/2017.     Functional Status:   In your present state of health, do you have any difficulty performing the following activities: 02/03/2017 01/27/2017  Hearing? Y N  Vision? N N  Difficulty  concentrating or making decisions? N N  Walking or climbing stairs? Y Y  Dressing or bathing? Y N  Doing errands, shopping? [name]  Preparing Food and eating ? Y -  Using the Toilet? N -  In the past six months, have you accidently leaked urine? N -  Do you have problems with loss of bowel control? N -  Managing your Medications? Y -  Managing your Finances? Y -  Housekeeping or managing your Housekeeping? N -  Some recent data might be hidden    Fall/Depression Screening:    Fall Risk  02/03/2017 01/27/2017 01/22/2017  Falls in the past year? Yes Yes Yes  Number falls in past yr: 2 or more 2 or more 2 or more  Injury  with Fall? No No No  Risk Factor Category  - High Fall Risk High Fall Risk  Risk for fall due to : Impaired mobility;Impaired balance/gait - History of fall(s);Impaired balance/gait;Impaired mobility;Medication side effect  Follow up Falls prevention discussed - Falls evaluation completed;Education provided;Follow up appointment   PHQ 2/9 Scores 02/03/2017 01/27/2017 01/22/2017 05/18/2015  PHQ - 2 Score 0 0 0 0  PHQ- 9 Score - 3 - 3    Assessment:  Pleasant 83 year old gentleman, lives with spouse/very supportive/manages his   medications (weekly pill planner).   Lungs clear, no c/o sob, chest pain.     Hyperlipidemia:  Per pt, has not picked up samples of cholesterol medication (view in Epic Livalo),       Stopped eating ice cream/switched to popsicles - better compliance with low cholesterol diet.   Plan:  Pt  to go to MD office today, pick up samples of cholesterol medications, take               As ordered, inquire when follow up lab needed.            As discussed with pt, plan to continue to follow with [facility], follow up again               Next month- home visit.   [facility] CM Care Plan Problem One     Most Recent Value  Care Plan Problem One  Hyperlipidemia   Role Documenting the Problem One  Care Management Coordinator  Care Plan for Problem One  Active  [facility] Long Term Goal   re established = Pt will see improvement in total cholesterol and triglycerides within the next 50 days   [facility] Long Term Goal Start Date  03/06/17  Interventions for Problem One Long Term Goal  As requested by spouse, reviewed with pt and spouse recent lipid panel lab results, importance of taking cholesterol medication as ordered.   [facility] CM Short Term Goal #1   Re established- pt would start taking cholesterol medication, take as ordered for the next 30 days   [facility] CM Short Term Goal #1 Start Date  02/03/17  Interventions for Short Term Goal #1  Discussed with pt importance of picking up cholesterol  medication (samples at MD office),take as ordered, with recent lipid panel results at risk for another stroke.   [facility] CM Short Term Goal #2   Pt would have better understanding of Low cholesterol diet in the next 30 days   [facility] CM Short Term Goal #2 Start Date  02/03/17  [facility] CM Short Term Goal #2 Met Date  03/06/17      Zara Chess.   [facility]  [phone]

## 2017-03-16 ENCOUNTER — Telehealth: Payer: Self-pay | Admitting: Family Medicine

## 2017-03-16 NOTE — Telephone Encounter (Signed)
Patient stated that he has not had any side effects. Patient notified he can pick-up more samples.

## 2017-03-16 NOTE — Telephone Encounter (Signed)
-----   Message from [name], MD sent at 01/29/2017  9:46 AM EDT ----- Regarding: Check lipids, ck before 02-28-17 Started livalo samples in june

## 2017-03-16 NOTE — Telephone Encounter (Signed)
Please check with patient to see if he took all of the Livalo samples we gave hime for cholesterol in June, and if he had any side effects. If he tolerated it we can het him more samples.

## 2017-03-19 DIAGNOSIS — R6889 Other general symptoms and signs: Secondary | ICD-10-CM | POA: Diagnosis not present

## 2017-03-19 DIAGNOSIS — W19XXXA Unspecified fall, initial encounter: Secondary | ICD-10-CM | POA: Diagnosis not present

## 2017-03-20 ENCOUNTER — Telehealth: Payer: Self-pay

## 2017-03-20 DIAGNOSIS — R269 Unspecified abnormalities of gait and mobility: Secondary | ICD-10-CM

## 2017-03-20 DIAGNOSIS — R296 Repeated falls: Secondary | ICD-10-CM

## 2017-03-20 DIAGNOSIS — R531 Weakness: Secondary | ICD-10-CM

## 2017-03-20 DIAGNOSIS — G20 Parkinson's disease: Secondary | ICD-10-CM

## 2017-03-20 NOTE — Telephone Encounter (Signed)
Daughter, Ivin Booty, called and states patient fell yesterday and the day before. Patient was using his walker and went to the bathroom and his knees went out/felt weak last night. EMS came out and they checked his vitals and said he was ok but to follow up with cardiologist and he has appointment with cardiologist on 04/01/17. Also daughter Ivin Booty states she wants to have patient to be referred to  Home health care for at least 3 visits a week at home. Due to Recurrent falls, for hygiene, physical weakness-patient cant wash himself and hard for wife to handle this. Please call daughter back at. [phone]

## 2017-03-24 ENCOUNTER — Telehealth: Payer: Self-pay | Admitting: Family Medicine

## 2017-03-24 NOTE — Telephone Encounter (Signed)
We did discuss this at 01-02-2017 office visit. Can you just send that note?

## 2017-03-24 NOTE — Telephone Encounter (Signed)
Per Keith Woods from [place] pt will need to come in for face to face in order for home health services to be covered.There is no documentation of falls from note from June

## 2017-03-25 NOTE — Telephone Encounter (Signed)
Christie from [place] called back stating that if you are willing you could add an addendum to note from May stating  referral is being placed at this time because of repeated calls from family stating that pt has fallen

## 2017-03-25 NOTE — Telephone Encounter (Signed)
Have made addendum to 01-02-2017 office note

## 2017-03-25 NOTE — Telephone Encounter (Signed)
Please advise 

## 2017-03-25 NOTE — Telephone Encounter (Signed)
She can see all notes in system.The face to face must be within a 3 month period for insurance to cover

## 2017-03-26 NOTE — Telephone Encounter (Signed)
Carlean Purl from [place] advised of addendum

## 2017-03-27 ENCOUNTER — Telehealth: Payer: Self-pay | Admitting: Family Medicine

## 2017-03-27 DIAGNOSIS — G20 Parkinson's disease: Secondary | ICD-10-CM | POA: Diagnosis not present

## 2017-03-27 DIAGNOSIS — M1611 Unilateral primary osteoarthritis, right hip: Secondary | ICD-10-CM | POA: Diagnosis not present

## 2017-03-27 NOTE — Telephone Encounter (Signed)
Error/MW °

## 2017-03-28 DIAGNOSIS — M1611 Unilateral primary osteoarthritis, right hip: Secondary | ICD-10-CM | POA: Diagnosis not present

## 2017-03-28 DIAGNOSIS — G20 Parkinson's disease: Secondary | ICD-10-CM | POA: Diagnosis not present

## 2017-04-01 ENCOUNTER — Encounter: Payer: Self-pay | Admitting: Internal Medicine

## 2017-04-01 ENCOUNTER — Ambulatory Visit (INDEPENDENT_AMBULATORY_CARE_PROVIDER_SITE_OTHER): Payer: Medicare Other | Admitting: Internal Medicine

## 2017-04-01 VITALS — BP 110/82 | HR 59 | Ht 74.0 in | Wt 172.5 lb

## 2017-04-01 DIAGNOSIS — I2510 Atherosclerotic heart disease of native coronary artery without angina pectoris: Secondary | ICD-10-CM | POA: Diagnosis not present

## 2017-04-01 DIAGNOSIS — M79604 Pain in right leg: Secondary | ICD-10-CM | POA: Diagnosis not present

## 2017-04-01 DIAGNOSIS — R42 Dizziness and giddiness: Secondary | ICD-10-CM | POA: Diagnosis not present

## 2017-04-01 DIAGNOSIS — M1611 Unilateral primary osteoarthritis, right hip: Secondary | ICD-10-CM | POA: Diagnosis not present

## 2017-04-01 DIAGNOSIS — G20 Parkinson's disease: Secondary | ICD-10-CM | POA: Diagnosis not present

## 2017-04-01 DIAGNOSIS — I5022 Chronic systolic (congestive) heart failure: Secondary | ICD-10-CM

## 2017-04-01 NOTE — Patient Instructions (Signed)
Medication Instructions:  Your physician has recommended you make the following change in your medication:  1- STOP taking Bisoprolol.   Labwork: none  Testing/Procedures: none  Follow-Up: Your physician recommends that you schedule a follow-up appointment in: 3 MONTHS WITH DR END.   If you need a refill on your cardiac medications before your next appointment, please call your pharmacy.

## 2017-04-01 NOTE — Progress Notes (Signed)
Follow-up Outpatient Visit Date: 04/01/2017  Chief Complaint: Fatigue  HPI:  Keith Woods is a 83 y.o. year-old male with history of chronic systolic heart failure with severely reduced LVEF (presumed ischemic based on abnormal myocardial perfusion stress test in 03/2016), stroke, hypertension, hyperlipidemia, recurrent syncope, chronic kidney disease stage III, and Parkinson's symptoms, who presents for follow-up of fatigue in the setting of chronic systolic heart failure, orthostatic hypotension, and Parkinson's disease. I last saw Keith Woods in late May, which time he was doing well with the exception of chronic fatigue. Today, he reports feeling about the same. He has limited energy, though he would like to do some activities. He is hoping to get back to driving; it sounds like he has already been doing some driving despite being encouraged by multiple physicians to avoid this. He has not had any chest pain, shortness of breath, palpitations, orthopnea, PND, or edema. He has occasional orthostatic lightheadedness, which is stable. He also has an intermittent nonproductive cough that has been present for several months.  Keith Woods has chronic pain in the right mid thigh with ambulation. He has been working with physical therapy again but feels like this has not improved his discomfort. He has also had considerable balance problems, causing him to fall 4 times in the last week. He uses a cane for balance.  --------------------------------------------------------------------------------------------------  Cardiovascular History & Procedures: Cardiovascular Problems:  Severe systolic heart failure (presumed ischemic)  Stroke  Orthostatic hypotension  Risk Factors:  Known CAD, hypertension, hyperlipidemia, age greater than 6, and male gender  Cath/PCI:  LHC (08/28/06): LMCA normal. LAD with mild luminal irregularities. Moderate sized ramus with mild luminal irregularities. Codominant LCx  with mild luminal irregularities. Small moderate sized codominant RCA with mild luminal irregularities. LVEF 60%.  CV Surgery:  None  EP Procedures and Devices:  None  Non-Invasive Evaluation(s):  Transthoracic echocardiogram (03/08/16): Normal LV size with severely reduced contraction (EF 25-30%) with diffuse hypokinesis. Grade 1 diastolic dysfunction. Mild MR. Normal RV size and function. Trivial TR.  Pharmacologic myocardial perfusion stress test (04/10/16): Large, severe inferior and apical defect consistent with prior infarction. No evidence of ischemia. LVEF 26%.  Limited echo (06/24/16): Normal LV size and wall thickness with LVEF of 25-30%. Mild aortic regurgitation. Normal RV size and function.  Recent CV Pertinent Labs: Lab Results  Component Value Date   CHOL 235 (H) 01/28/2017   HDL 35 (L) 01/28/2017   LDLCALC 139 (H) 01/28/2017   TRIG 306 (H) 01/28/2017   CHOLHDL 6.7 (H) 01/28/2017   CHOLHDL 5.2 03/09/2016   INR 1.02 03/08/2016   K 3.7 06/27/2016   MG 1.8 06/25/2016   BUN 37 (H) 06/27/2016   BUN 26 02/05/2016   CREATININE 1.74 (H) 06/27/2016    Past medical and surgical history were reviewed and updated in EPIC.   Current Meds  Medication Sig  . allopurinol (ZYLOPRIM) 100 MG tablet take 2 tablets by mouth once daily  . aspirin EC 81 MG EC tablet Take 1 tablet (81 mg total) by mouth daily.  Marland Kitchen b complex vitamins tablet Take 1 tablet by mouth daily.  . bisoprolol (ZEBETA) 5 MG tablet Take 0.5 tablets (2.5 mg total) by mouth daily.  . Carbidopa-Levodopa ER (SINEMET CR) 25-100 MG tablet controlled release Take 1 tablet by mouth 2 (two) times daily.  . citalopram (CELEXA) 10 MG tablet take 1 tablet by mouth twice a day  . Colchicine (MITIGARE) 0.6 MG CAPS Two tablets today, then 1 tablet daily  .  midodrine (PROAMATINE) 5 MG tablet Take 5 mg by mouth 3 (three) times daily with meals.  . Multiple Vitamin (MULTIVITAMIN) tablet Take 1 tablet by mouth daily.    .  nitroGLYCERIN (NITROSTAT) 0.4 MG SL tablet Place 1 tablet (0.4 mg total) under the tongue every 5 (five) minutes as needed for chest pain.  . Probiotic Product (ALIGN) 4 MG CAPS Take 4 mg by mouth daily.  . sodium bicarbonate 650 MG tablet Take 650 mg by mouth 2 (two) times daily.  . Vitamin D, Ergocalciferol, (DRISDOL) 50000 units CAPS capsule TAKE 1 CAPSULE ONCE A WEEK    Allergies: Lipitor [atorvastatin calcium]; Nsaids; and Statins  Social History   Social History  . Marital status: Married    Spouse name: N/A  . Number of children: 3  . Years of education: N/A   Occupational History  . minister-retired    Social History Main Topics  . Smoking status: Former Research scientist (life sciences)  . Smokeless tobacco: Never Used  . Alcohol use No  . Drug use: No  . Sexual activity: Not on file   Other Topics Concern  . Not on file   Social History Narrative  . No narrative on file    Family History  Problem Relation Age of Onset  . Ovarian cancer Mother   . Tuberculosis Mother   . Prostate cancer Father     Review of Systems: A 12-system review of systems was performed and was negative except as noted in the HPI.  --------------------------------------------------------------------------------------------------  Physical Exam: BP 110/82 (BP Location: Left Arm, Patient Position: Sitting, Cuff Size: Normal)   Pulse (!) 59   Ht 6\' 2"  (1.88 m)   Wt 172 lb 8 oz (78.2 kg)   BMI 22.15 kg/m    General:  Frail, elderly man seated in a wheelchair. He is accompanied by his daughter. HEENT: No conjunctival pallor or scleral icterus.  Moist mucous membranes.  OP clear. Neck: Supple without lymphadenopathy, thyromegaly, JVD, or HJR. Lungs: Normal work of breathing.  Clear to auscultation bilaterally without wheezes or crackles. Heart: Distant heart sounds. Bradycardic but regular without murmurs, rubs, or gallops. Nondisplaced PMI. Abd: Bowel sounds present.  Soft, NT/ND without  hepatosplenomegaly Ext: No lower extremity edema.  Compression stockings in place. 2+ radial pulses bilaterally.  Skin: Warm and dry.  EKG: Sinus bradycardia (heart rate 59 bpm), left axis deviation, and left bundle branch block.  Lab Results  Component Value Date   WBC 6.5 06/27/2016   HGB 9.3 (L) 06/27/2016   HCT 27.2 (L) 06/27/2016   MCV 97.0 06/27/2016   PLT 129 (L) 06/27/2016    Lab Results  Component Value Date   NA 138 06/27/2016   K 3.7 06/27/2016   CL 106 06/27/2016   CO2 24 06/27/2016   BUN 37 (H) 06/27/2016   CREATININE 1.74 (H) 06/27/2016   GLUCOSE 101 (H) 06/27/2016   ALT 18 06/05/2016    Lab Results  Component Value Date   CHOL 235 (H) 01/28/2017   HDL 35 (L) 01/28/2017   LDLCALC 139 (H) 01/28/2017   TRIG 306 (H) 01/28/2017   CHOLHDL 6.7 (H) 01/28/2017    --------------------------------------------------------------------------------------------------  ASSESSMENT AND PLAN: Chronic systolic heart failure due to presumed ischemic cardiomyopathy: Mr. Wence appears euvolemic. It is difficult to assess his functional status due to his other comorbidities, though it is likely NYHA class III. His only evidence based heart failure therapy at this time is low-dose bisoprolol. Given continued fatigue and lightheadedness as well  as sinus bradycardia, we have decided to discontinue this as well.  Coronary artery disease: No recurrent chest pain. Continue with low-dose aspirin for secondary prevention. Mr. Ackert is not currently on a statin due to intolerance of several agents.  Orthostatic lightheadedness/hypotension: Orthostatic lightheadedness seems stable, though Mr. Heider has fallen 4 times in the last week. He reports that this is due to balance problems. I encouraged him to work with physical therapy and to consider using a walker rather than a cane to provide more support. We will continue midodrine and discontinue sotalol, as above.  Right leg  pain Unchanged since prior visit and likely musculoskeletal in nature. Defer further evaluation and treatment to Dr. Caryn Section..  Follow-up: Return to clinic in 3 months.  Nelva Bush, MD 04/01/2017 11:58 AM

## 2017-04-02 DIAGNOSIS — M1611 Unilateral primary osteoarthritis, right hip: Secondary | ICD-10-CM | POA: Diagnosis not present

## 2017-04-02 DIAGNOSIS — G20 Parkinson's disease: Secondary | ICD-10-CM | POA: Diagnosis not present

## 2017-04-02 NOTE — Addendum Note (Signed)
Addended by: Britt Bottom on: 04/02/2017 03:51 PM   Modules accepted: Orders

## 2017-04-03 DIAGNOSIS — G20 Parkinson's disease: Secondary | ICD-10-CM | POA: Diagnosis not present

## 2017-04-03 DIAGNOSIS — M1611 Unilateral primary osteoarthritis, right hip: Secondary | ICD-10-CM | POA: Diagnosis not present

## 2017-04-06 ENCOUNTER — Ambulatory Visit (INDEPENDENT_AMBULATORY_CARE_PROVIDER_SITE_OTHER): Payer: Medicare Other | Admitting: Family Medicine

## 2017-04-06 ENCOUNTER — Other Ambulatory Visit: Payer: Self-pay | Admitting: *Deleted

## 2017-04-06 ENCOUNTER — Encounter: Payer: Self-pay | Admitting: Family Medicine

## 2017-04-06 VITALS — BP 100/70 | HR 56 | Temp 98.0°F | Resp 16

## 2017-04-06 DIAGNOSIS — I2510 Atherosclerotic heart disease of native coronary artery without angina pectoris: Secondary | ICD-10-CM | POA: Diagnosis not present

## 2017-04-06 DIAGNOSIS — I255 Ischemic cardiomyopathy: Secondary | ICD-10-CM

## 2017-04-06 DIAGNOSIS — L03116 Cellulitis of left lower limb: Secondary | ICD-10-CM | POA: Diagnosis not present

## 2017-04-06 MED ORDER — CEPHALEXIN 500 MG PO CAPS: 500.0000 mg | ORAL_CAPSULE | Freq: Four times a day (QID) | ORAL | 0 refills | Status: DC

## 2017-04-06 MED ORDER — EZETIMIBE 10 MG PO TABS: 10.0000 mg | ORAL_TABLET | Freq: Every day | ORAL | 3 refills | Status: DC

## 2017-04-06 MED ORDER — CEPHALEXIN 500 MG PO CAPS: 500.0000 mg | ORAL_CAPSULE | Freq: Four times a day (QID) | ORAL | 0 refills | Status: AC

## 2017-04-06 NOTE — Progress Notes (Signed)
Patient: Keith Woods Male    DOB: 08-10-1926   83 y.o.   MRN: [id number] Visit Date: 04/06/2017  Today's Provider: [name], MD   Chief Complaint  Patient presents with  . Knee Pain   Subjective:    Patient's left started hurting 2 days ago. Some swelling and unable to bear weight. Patient stated that he did PT on the day before.   Knee Pain   The incident occurred 2 days ago. The incident occurred at home. There was no injury mechanism. The pain is present in the left knee. The quality of the pain is described as aching, burning, cramping, shooting and stabbing. The pain is at a severity of 8/10. The pain is severe. The pain has been intermittent since onset. Associated symptoms include an inability to bear weight. He reports no foreign bodies present. The symptoms are aggravated by movement and weight bearing. He has tried nothing for the symptoms.   He states he had a fall three evenings ago and pain started next day. Slow onset and getting steadily worse.     Allergies  Allergen Reactions  . Lipitor [Atorvastatin Calcium]     Leg pain  . Nsaids   . Statins     Leg pain     Current Outpatient Prescriptions:  .  allopurinol (ZYLOPRIM) 100 MG tablet, take 2 tablets by mouth once daily, Disp: 60 tablet, Rfl: 5 .  aspirin EC 81 MG EC tablet, Take 1 tablet (81 mg total) by mouth daily., Disp: 30 tablet, Rfl: 0 .  b complex vitamins tablet, Take 1 tablet by mouth daily., Disp: , Rfl:  .  Carbidopa-Levodopa ER (SINEMET CR) 25-100 MG tablet controlled release, Take 1 tablet by mouth 2 (two) times daily., Disp: 60 tablet, Rfl: 0 .  citalopram (CELEXA) 10 MG tablet, take 1 tablet by mouth twice a day, Disp: 60 tablet, Rfl: 12 .  Colchicine (MITIGARE) 0.6 MG CAPS, Two tablets today, then 1 tablet daily, Disp: 30 capsule, Rfl: 3 .  midodrine (PROAMATINE) 5 MG tablet, Take 5 mg by mouth 3 (three) times daily with meals., Disp: , Rfl:  .  Multiple Vitamin  (MULTIVITAMIN) tablet, Take 1 tablet by mouth daily.  , Disp: , Rfl:  .  nitroGLYCERIN (NITROSTAT) 0.4 MG SL tablet, Place 1 tablet (0.4 mg total) under the tongue every 5 (five) minutes as needed for chest pain., Disp: 100 tablet, Rfl: 3 .  Probiotic Product (ALIGN) 4 MG CAPS, Take 4 mg by mouth daily., Disp: , Rfl:  .  sodium bicarbonate 650 MG tablet, Take 650 mg by mouth 2 (two) times daily., Disp: , Rfl: 0 .  Vitamin D, Ergocalciferol, (DRISDOL) 50000 units CAPS capsule, TAKE 1 CAPSULE ONCE A WEEK, Disp: 12 capsule, Rfl: 4  Review of Systems  Constitutional: Negative for appetite change, chills and fever.  Respiratory: Negative for chest tightness, shortness of breath and wheezing.   Cardiovascular: Negative for chest pain and palpitations.  Gastrointestinal: Negative for abdominal pain, nausea and vomiting.    Social History  Substance Use Topics  . Smoking status: Former Research scientist (life sciences)  . Smokeless tobacco: Never Used  . Alcohol use No   Objective:   BP 100/70 (BP Location: Right Arm, Patient Position: Sitting, Cuff Size: Normal)   Pulse (!) 56   Temp 98 F (36.7 C) (Oral)   Resp 16   SpO2 98%  Vitals:   04/06/17 1613  BP: 100/70  Pulse: (!) 56  Resp: 16  Temp: 98 F (36.7 C)  TempSrc: Oral  SpO2: 98%     Physical Exam  General appearance: alert, well developed, well nourished, cooperative and in no distress Head: Normocephalic, without obvious abnormality, atraumatic Respiratory: Respirations even and unlabored, normal respiratory rate Extremities: Left lateral knee with about 3cm oval abrasion and 3-4 cm surrounding erythema. No discharge. Swollen and tender around abrasion which is area patient states is source of pain. No tenderness or swelling over rest of knee.      Assessment & Plan:     1. Cellulitis of left lower extremity  - cephALEXin (KEFLEX) 500 MG capsule; Take 1 capsule (500 mg total) by mouth 4 (four) times daily.  Dispense: 28 capsule; Refill: 0  2.  Coronary artery disease involving native coronary artery of native heart without angina pectoris He also states he stopped rosuvastatin since it made his thighs hurt. Will try Zetia.  - ezetimibe (ZETIA) 10 MG tablet; Take 1 tablet (10 mg total) by mouth daily.  Dispense: 30 tablet; Refill: 3       [name], MD  [facility]

## 2017-04-06 NOTE — Patient Outreach (Signed)
[facility]) Care Management   04/06/2017  Keith Woods 1926/06/08 [id number]  Keith Woods is an 83 y.o. male  Subjective:  Pt reports on recent falls, knees gave out/currently using walker although would  Prefer cane.  Daughter Keith Woods, present during visit reports pt had 4 falls in one week, most recent 2 nights ago, arranged to have private paid aid to come three days a week/3 hours each day to  Assist pt with ADLs/monitor/assist spouse, today is aide's first day.  Pt reports HH PT from [facility] Is coming twice a week to help with balance.  Daughter reports pt is currently not walking/doing  Exercises with PT sitting, wanted RN CM to look at pt's left knee/red/swelling to which pt cannot recall  How it happened.    Pt reports did pick up samples of cholesterol medication Livalo from  MD office, took it for one week then stopped/per daughter spouse noticed pt having increased  Weakness.   Pt reports he did not let PCP know he stopped the medication.     Objective:   Vitals:   04/06/17 1141  BP: 116/66  Pulse: 89  Resp: 16  SpO2: 98%    ROS  Physical Exam  Constitutional: He is oriented to person, place, and time. He appears well-developed and well-nourished.  Cardiovascular: Normal rate, regular rhythm and normal heart sounds.   Respiratory: Effort normal and breath sounds normal.  Diminished sounds- posterior right  lower lobe.   GI: Soft. Bowel sounds are normal.  Musculoskeletal: He exhibits edema and tenderness.  Edema noted left knee.   Neurological: He is alert and oriented to person, place, and time.  Skin:  Left knee/left  lateral side- appears as an abrasion/red/edema (+1), warm/tender to touch   Psychiatric: He has a normal mood and affect. His behavior is normal. Judgment and thought content normal.    Encounter Medications:   Outpatient Encounter Prescriptions as of 04/06/2017  Medication Sig Note  . allopurinol (ZYLOPRIM) 100  MG tablet take 2 tablets by mouth once daily   . aspirin EC 81 MG EC tablet Take 1 tablet (81 mg total) by mouth daily.   Marland Kitchen b complex vitamins tablet Take 1 tablet by mouth daily.   . Carbidopa-Levodopa ER (SINEMET CR) 25-100 MG tablet controlled release Take 1 tablet by mouth 2 (two) times daily.   . citalopram (CELEXA) 10 MG tablet take 1 tablet by mouth twice a day   . Colchicine (MITIGARE) 0.6 MG CAPS Two tablets today, then 1 tablet daily 04/06/2017: Pt to take daily for 3 days, then 2 weeks off   . midodrine (PROAMATINE) 5 MG tablet Take 5 mg by mouth 3 (three) times daily with meals. 03/06/2017: Repeated medication   . Multiple Vitamin (MULTIVITAMIN) tablet Take 1 tablet by mouth daily.     . Probiotic Product (ALIGN) 4 MG CAPS Take 4 mg by mouth daily.   . sodium bicarbonate 650 MG tablet Take 650 mg by mouth 2 (two) times daily.   . Vitamin D, Ergocalciferol, (DRISDOL) 50000 units CAPS capsule TAKE 1 CAPSULE ONCE A WEEK 03/06/2017: Different dosage 1.25 mg tablet   . nitroGLYCERIN (NITROSTAT) 0.4 MG SL tablet Place 1 tablet (0.4 mg total) under the tongue every 5 (five) minutes as needed for chest pain. (Patient not taking: Reported on 04/06/2017) 04/06/2017: Available if needed.    No facility-administered encounter medications on file as of 04/06/2017.     Functional Status:   In your  present state of health, do you have any difficulty performing the following activities: 02/03/2017 01/27/2017  Hearing? Y N  Vision? N N  Difficulty concentrating or making decisions? N N  Walking or climbing stairs? Y Y  Dressing or bathing? Y N  Comment spouse assists  -  Doing errands, shopping? [name]  Preparing Food and eating ? Y -  Using the Toilet? N -  In the past six months, have you accidently leaked urine? N -  Do you have problems with loss of bowel control? N -  Managing your Medications? Y -  Comment spouse does pill planner  -  Managing your Finances? Y -  Housekeeping or managing your  Housekeeping? N -  Some recent data might be hidden    Fall/Depression Screening:    Fall Risk  02/03/2017 01/27/2017 01/22/2017  Falls in the past year? Yes Yes Yes  Number falls in past yr: 2 or more 2 or more 2 or more  Comment four  4 -  Injury with Fall? No No No  Risk Factor Category  - High Fall Risk High Fall Risk  Risk for fall due to : Impaired mobility;Impaired balance/gait - History of fall(s);Impaired balance/gait;Impaired mobility;Medication side effect  Follow up Falls prevention discussed - Falls evaluation completed;Education provided;Follow up appointment   [facility] 2/9 Scores 02/03/2017 01/27/2017 01/22/2017 05/18/2015  PHQ - 2 Score 0 0 0 0  PHQ- 9 Score - 3 - 3    Assessment:  Pleasant 83 year old male, resides with spouse, daughter from out of town present during  Home visit as well as  Aide(private pay)  Keith Woods from [place] agency present.   Lungs clear except  Posterior right lower lobe (diminished sounds).    Skin integrity:  Left knee/left lateral side- +1 edema/redness/tender to touch/warm.  Had a fall 2 nights         Ago/does not recall hitting anything.  RN CM discussed with pt and daughter need for pt to follow up         With PCP to assess left knee to which daughter called MD's office during visit/unable to reach/to call         Back [facility] consent form updated to include pt's daughter Keith Woods).  Recent falls:  Pt at risk for recurrent falls  due to right leg weakness, left knee redness/edema.  Reinforced         Use of walker.  Hyperlipidemia:  Per pt stopped taking Livalo, did not inform PCP who prescribed medication.  Discussed         With pt notifying PCP to which pt said he would do.     Plan:   As discussed, daughter Keith Woods to call PCP office back/request pt be seen-assess left knee (redness,                 Swelling,tender to touch), call RN CM back to let know if appointment was made.              As discussed, pt to let PCP know Livalo was stopped.               As discussed, RN CM to continue to provide Community CM services, follow up again next month-                Home visit.    [facility] CM Care Plan Problem One     Most Recent Value  Care Plan Problem One  Hyperlipidemia   Role Documenting the Problem One  Care Management [place] for Problem One  Active  [facility] Long Term Goal   Re established pt would see improvement in total cholesterol and triglycerides in the next 60 days   [facility]N Long Term Goal Start Date  04/06/17  Interventions for Problem One Long Term Goal  Reviewed with pt again recent Lipid panel- total cholesterol /triglerides up from last lab results, need to let PCP know stopped Livalo   [facility] CM Short Term Goal #1   re established- pt would start taking cholesterol medication, take as ordered for the next 30 days   [facility] CM Short Term Goal #1 Start Date  03/06/17 Donivan Scull established 03/06/17 ]  [facility] CM Short Term Goal #2   Pt would continue with low cholesterol diet for the next 30 days   [facility] CM Short Term Goal #2 Start Date  04/06/17  Interventions for Short Term Goal #2  Reinforced with pt ongoing adherence with low cholesterol diet     [facility] CM Care Plan Problem Two     Most Recent Value  Care Plan Problem Two  skin integrity- left knee  Role Documenting the Problem Two  Care Management Coordinator  Care Plan for Problem Two  Active  [facility] CM Short Term Goal #1   Pt would see improvement in left knee in the next 30 days   [facility] CM Short Term Goal #1 Start Date  04/06/17  Interventions for Short Term Goal #2   Discussed with pt/daughter need for pt to f/u with PCP to assess left knee- redness, edema, tender to touch, limiting mobility.      Zara Chess.   [place] Care Management  [phone]

## 2017-04-07 ENCOUNTER — Telehealth: Payer: Self-pay | Admitting: Family Medicine

## 2017-04-07 ENCOUNTER — Other Ambulatory Visit: Payer: Self-pay | Admitting: *Deleted

## 2017-04-07 ENCOUNTER — Encounter (INDEPENDENT_AMBULATORY_CARE_PROVIDER_SITE_OTHER): Payer: Medicare Other | Admitting: Family Medicine

## 2017-04-07 DIAGNOSIS — N184 Chronic kidney disease, stage 4 (severe): Secondary | ICD-10-CM | POA: Diagnosis not present

## 2017-04-07 DIAGNOSIS — I2510 Atherosclerotic heart disease of native coronary artery without angina pectoris: Secondary | ICD-10-CM | POA: Diagnosis not present

## 2017-04-07 DIAGNOSIS — I951 Orthostatic hypotension: Secondary | ICD-10-CM | POA: Diagnosis not present

## 2017-04-07 DIAGNOSIS — M353 Polymyalgia rheumatica: Secondary | ICD-10-CM | POA: Diagnosis not present

## 2017-04-07 DIAGNOSIS — M109 Gout, unspecified: Secondary | ICD-10-CM | POA: Diagnosis not present

## 2017-04-07 DIAGNOSIS — N189 Chronic kidney disease, unspecified: Secondary | ICD-10-CM | POA: Diagnosis not present

## 2017-04-07 DIAGNOSIS — M1611 Unilateral primary osteoarthritis, right hip: Secondary | ICD-10-CM

## 2017-04-07 DIAGNOSIS — I129 Hypertensive chronic kidney disease with stage 1 through stage 4 chronic kidney disease, or unspecified chronic kidney disease: Secondary | ICD-10-CM | POA: Diagnosis not present

## 2017-04-07 DIAGNOSIS — R809 Proteinuria, unspecified: Secondary | ICD-10-CM | POA: Diagnosis not present

## 2017-04-07 DIAGNOSIS — I130 Hypertensive heart and chronic kidney disease with heart failure and stage 1 through stage 4 chronic kidney disease, or unspecified chronic kidney disease: Secondary | ICD-10-CM

## 2017-04-07 DIAGNOSIS — I5022 Chronic systolic (congestive) heart failure: Secondary | ICD-10-CM

## 2017-04-07 DIAGNOSIS — E872 Acidosis: Secondary | ICD-10-CM | POA: Diagnosis not present

## 2017-04-07 DIAGNOSIS — D631 Anemia in chronic kidney disease: Secondary | ICD-10-CM

## 2017-04-07 DIAGNOSIS — G20 Parkinson's disease: Secondary | ICD-10-CM

## 2017-04-07 DIAGNOSIS — F339 Major depressive disorder, recurrent, unspecified: Secondary | ICD-10-CM

## 2017-04-07 NOTE — Patient Outreach (Signed)
Late entry: This RN CM received an email from daughter Javarious Elsayed (on [facility] consent form) 04/06/17 at 2:25 pm that pt has an appointment with PCP 04/06/17 at 3:45pm.   This RN CM to follow up with pt within next 2 days telephonically- check on status of left knee.    Zara Chess.   [place] Care Management  [phone]

## 2017-04-07 NOTE — Telephone Encounter (Signed)
Please review-aa 

## 2017-04-07 NOTE — Patient Outreach (Signed)
Successful telephone encounter to Geanie Berlin Roach, 83 year old male/ follow up on pt's  PCP visit yesterday-  Left knee redness/edema.   Pt reports today is limpy, started antibiotic yesterday.   RN CM inquired about new cholesterol medication (view in Epic, PCP visit -to start on  Zetia) if medication  started to which pt as relayed by spouse has the medication, has not started taking yet.      Plan:  As discussed with pt, plan to follow up again next week telephonically, check on left knee status.    Zara Chess.   [place] Care Management  [phone]

## 2017-04-07 NOTE — Telephone Encounter (Signed)
Keith Woods with [name] is requesting a verbal order for nursing visits 1 time a week for 2 weeks for left knee wound.  CB#[phone]/MW

## 2017-04-08 ENCOUNTER — Telehealth: Payer: Self-pay | Admitting: Family Medicine

## 2017-04-08 DIAGNOSIS — G20 Parkinson's disease: Secondary | ICD-10-CM | POA: Diagnosis not present

## 2017-04-08 DIAGNOSIS — M1611 Unilateral primary osteoarthritis, right hip: Secondary | ICD-10-CM | POA: Diagnosis not present

## 2017-04-08 NOTE — Telephone Encounter (Signed)
Keith Woods was given the ok.

## 2017-04-08 NOTE — Telephone Encounter (Addendum)
Keith Woods with [name] called to advise pt and pt family have decided he will not be taking the Rx ezetimibe (ZETIA) 10 MG tablet due to the side effects.  Pt and family are concerned due to pt already has kidney issues.  CB#[phone]/MW

## 2017-04-08 NOTE — Telephone Encounter (Signed)
OK 

## 2017-04-09 ENCOUNTER — Telehealth: Payer: Self-pay | Admitting: Family Medicine

## 2017-04-09 NOTE — Telephone Encounter (Signed)
Please check with patient to see how his knee is doing. It should be improving since we started antibiotic earlier this week.

## 2017-04-09 NOTE — Telephone Encounter (Signed)
LMTCB

## 2017-04-10 DIAGNOSIS — G20 Parkinson's disease: Secondary | ICD-10-CM | POA: Diagnosis not present

## 2017-04-10 DIAGNOSIS — M1611 Unilateral primary osteoarthritis, right hip: Secondary | ICD-10-CM | POA: Diagnosis not present

## 2017-04-10 NOTE — Telephone Encounter (Signed)
Spoke with pt, he reports his knee is much better now.  "It is almost completely cleared up."  Thanks,   -Keith Woods

## 2017-04-14 DIAGNOSIS — M1611 Unilateral primary osteoarthritis, right hip: Secondary | ICD-10-CM | POA: Diagnosis not present

## 2017-04-14 DIAGNOSIS — G20 Parkinson's disease: Secondary | ICD-10-CM | POA: Diagnosis not present

## 2017-04-15 ENCOUNTER — Ambulatory Visit: Payer: Self-pay | Admitting: *Deleted

## 2017-04-16 DIAGNOSIS — M1611 Unilateral primary osteoarthritis, right hip: Secondary | ICD-10-CM | POA: Diagnosis not present

## 2017-04-16 DIAGNOSIS — G20 Parkinson's disease: Secondary | ICD-10-CM | POA: Diagnosis not present

## 2017-04-17 ENCOUNTER — Other Ambulatory Visit: Payer: Self-pay | Admitting: *Deleted

## 2017-04-17 NOTE — Patient Outreach (Signed)
Successful telephone encounter to Keith Woods, 83 year old male for follow up on status of left knee/falls.  Spoke with pt, HIPAA identifiers verified.  Pt reports left knee is healed, ambulating with a walker/cane, no recent falls.  Pt reports stopped cholesterol medication,continues with low cholesterol diet.    Plan:  As discussed with pt, plan to follow up again later this month- home visit.    Zara Chess.   [place] Care Management  [phone]

## 2017-04-20 DIAGNOSIS — M1611 Unilateral primary osteoarthritis, right hip: Secondary | ICD-10-CM | POA: Diagnosis not present

## 2017-04-20 DIAGNOSIS — G20 Parkinson's disease: Secondary | ICD-10-CM | POA: Diagnosis not present

## 2017-04-21 ENCOUNTER — Telehealth: Payer: Self-pay | Admitting: Family Medicine

## 2017-04-21 NOTE — Telephone Encounter (Signed)
Please advise 

## 2017-04-21 NOTE — Telephone Encounter (Signed)
Ok

## 2017-04-21 NOTE — Telephone Encounter (Signed)
Keith Woods with Keith Woods is requesting a verbal order to continue home health Physical Therapy for 1 time a week for 2 weeks.  CB#[phone]/MW

## 2017-04-21 NOTE — Telephone Encounter (Signed)
Mel was given okay.

## 2017-04-22 DIAGNOSIS — M48062 Spinal stenosis, lumbar region with neurogenic claudication: Secondary | ICD-10-CM | POA: Diagnosis not present

## 2017-04-22 DIAGNOSIS — M5416 Radiculopathy, lumbar region: Secondary | ICD-10-CM | POA: Diagnosis not present

## 2017-04-22 DIAGNOSIS — M5126 Other intervertebral disc displacement, lumbar region: Secondary | ICD-10-CM | POA: Diagnosis not present

## 2017-04-24 DIAGNOSIS — G20 Parkinson's disease: Secondary | ICD-10-CM | POA: Diagnosis not present

## 2017-04-24 DIAGNOSIS — M1611 Unilateral primary osteoarthritis, right hip: Secondary | ICD-10-CM | POA: Diagnosis not present

## 2017-04-28 ENCOUNTER — Telehealth: Payer: Self-pay | Admitting: Family Medicine

## 2017-04-28 NOTE — Telephone Encounter (Signed)
Denyse Amass with [name] called to advise pt was discharged services last Friday with goals met.  CB#[phone]/MW

## 2017-05-07 ENCOUNTER — Other Ambulatory Visit: Payer: Self-pay | Admitting: *Deleted

## 2017-05-07 ENCOUNTER — Encounter: Payer: Self-pay | Admitting: *Deleted

## 2017-05-07 NOTE — Patient Outreach (Signed)
[facility]) Care Management   05/07/2017  Keith Woods March 08, 1926 [id number]  Keith Woods is an 83 y.o. male  Subjective: Pt reports on recent visit with Dr. Sharlet Woods- consult  about getting spinal injection, to  Have 2 injections  (first 10/15, second 11/12) to which MD feels will help with right upper leg pain.  Pt reports pain in right leg occurs when up walking, no pain when sitting.  Spouse reports pt  Had a fall 2 days ago, tripped over landing going into kitchen, no injury. Spouse reports  Discharged from [facility] RN and PT services.   Pt reports currently not  taking any cholesterol medication, unable to tolerate statins, working on low Cholesterol diet.    Objective:   Vitals:   05/07/17 1026  BP: 122/72  Pulse: 75  Resp: 20  SpO2: 97%    ROS  Physical Exam  Constitutional: He is oriented to person, place, and time. He appears well-developed and well-nourished.  Cardiovascular: Normal rate, regular rhythm and normal heart sounds.   Respiratory: Effort normal and breath sounds normal.  GI: Soft. Bowel sounds are normal.  Musculoskeletal: He exhibits no edema.  Neurological: He is alert and oriented to person, place, and time.  Skin: Skin is warm and dry.  Psychiatric: He has a normal mood and affect. His behavior is normal. Judgment and thought content normal.    Encounter Medications:   Outpatient Encounter Prescriptions as of 05/07/2017  Medication Sig Note  . allopurinol (ZYLOPRIM) 100 MG tablet take 2 tablets by mouth once daily   . aspirin EC 81 MG EC tablet Take 1 tablet (81 mg total) by mouth daily.   Marland Kitchen b complex vitamins tablet Take 1 tablet by mouth daily.   . Carbidopa-Levodopa ER (SINEMET CR) 25-100 MG tablet controlled release Take 1 tablet by mouth 2 (two) times daily.   . citalopram (CELEXA) 10 MG tablet take 1 tablet by mouth twice a day   . Colchicine (MITIGARE) 0.6 MG CAPS Two tablets today, then 1 tablet daily  04/06/2017: Pt to take daily for 3 days, then 2 weeks off   . midodrine (PROAMATINE) 5 MG tablet Take 5 mg by mouth 3 (three) times daily with meals. 03/06/2017: Repeated medication   . Multiple Vitamin (MULTIVITAMIN) tablet Take 1 tablet by mouth daily.     . Probiotic Product (ALIGN) 4 MG CAPS Take 4 mg by mouth daily.   . sodium bicarbonate 650 MG tablet Take 650 mg by mouth 2 (two) times daily.   . Vitamin D, Ergocalciferol, (DRISDOL) 50000 units CAPS capsule TAKE 1 CAPSULE ONCE A WEEK 03/06/2017: Different dosage 1.25 mg tablet   . ezetimibe (ZETIA) 10 MG tablet Take 1 tablet (10 mg total) by mouth daily. (Patient not taking: Reported on 05/07/2017)   . nitroGLYCERIN (NITROSTAT) 0.4 MG SL tablet Place 1 tablet (0.4 mg total) under the tongue every 5 (five) minutes as needed for chest pain. (Patient not taking: Reported on 05/07/2017) 05/07/2017: Review of medication- expired.  Pt to call MD    No facility-administered encounter medications on file as of 05/07/2017.     Functional Status:   In your present state of health, do you have any difficulty performing the following activities: 02/03/2017 01/27/2017  Hearing? Y N  Vision? N N  Difficulty concentrating or making decisions? N N  Walking or climbing stairs? Y Y  Dressing or bathing? Y N  Comment spouse assists  -  Doing errands, shopping?  [name]  Preparing Food and eating ? Y -  Using the Toilet? N -  In the past six months, have you accidently leaked urine? N -  Do you have problems with loss of bowel control? N -  Managing your Medications? Y -  Comment spouse does pill planner  -  Managing your Finances? Y -  Housekeeping or managing your Housekeeping? N -  Some recent data might be hidden    Fall/Depression Screening:    Fall Risk  02/03/2017 01/27/2017 01/22/2017  Falls in the past year? Yes Yes Yes  Number falls in past yr: 2 or more 2 or more 2 or more  Comment four  4 -  Injury with Fall? No No No  Risk Factor Category  - High  Fall Risk High Fall Risk  Risk for fall due to : Impaired mobility;Impaired balance/gait - History of fall(s);Impaired balance/gait;Impaired mobility;Medication side effect  Follow up Falls prevention discussed - Falls evaluation completed;Education provided;Follow up appointment   PHQ 2/9 Scores 02/03/2017 01/27/2017 01/22/2017 05/18/2015  PHQ - 2 Score 0 0 0 0  PHQ- 9 Score - 3 - 3    Assessment:  Pleasant 83 year old male, resides with spouse (manages pt's medications).   Lungs  Clear, no complaints of pain, sob, no edema noted.   Fall risk:  Per spouse pt had a recent fall, no injury.   RN CM discussed with pt ongoing use of       Walker.     Hyperlipidemia:  Per pt working on low cholesterol diet, unable to tolerate statins.    Medication issue: review of pt's Nitroglycerin SL shows medication expired (2015).  As discussed,         Pt to call local pharmacy to see if have refills, if not to follow up with PCP - request refill.     Plan: As discussed with pt, plan to discharge from Community CM services- goals met.            Plan to inform Dr. Caryn Woods of discharge, send by in basket case closure letter.            Plan to inform [facility] CMA to close case.   [facility] CM Care Plan Problem One     Most Recent Value  Care Plan Problem One  Hyperlipidemia   Role Documenting the Problem One  Care Management Coordinator  Care Plan for Problem One  Active  [facility]N Long Term Goal   Pt would see improvment in total cholesterol and trigylcerides in the next 60 days   THN Long Term Goal Start Date  04/06/17  [facility] CM Short Term Goal #2   Pt would continue with low cholesterol diet for the next 30 days   [facility]N CM Short Term Goal #2 Start Date  04/06/17  [facility] CM Short Term Goal #2 Met Date  05/07/17     Keith Woods.   [place] Care Management  [phone]

## 2017-05-25 DIAGNOSIS — M48062 Spinal stenosis, lumbar region with neurogenic claudication: Secondary | ICD-10-CM | POA: Diagnosis not present

## 2017-05-25 DIAGNOSIS — M5416 Radiculopathy, lumbar region: Secondary | ICD-10-CM | POA: Diagnosis not present

## 2017-05-29 ENCOUNTER — Inpatient Hospital Stay: Payer: Medicare Other

## 2017-05-29 ENCOUNTER — Emergency Department: Payer: Medicare Other

## 2017-05-29 ENCOUNTER — Inpatient Hospital Stay
Admission: EM | Admit: 2017-05-29 | Discharge: 2017-05-31 | DRG: 312 | Disposition: A | Discharge status: Home / Self Care | Payer: Medicare Other | Attending: Internal Medicine | Admitting: Internal Medicine

## 2017-05-29 DIAGNOSIS — I214 Non-ST elevation (NSTEMI) myocardial infarction: Secondary | ICD-10-CM | POA: Diagnosis not present

## 2017-05-29 DIAGNOSIS — Z87891 Personal history of nicotine dependence: Secondary | ICD-10-CM

## 2017-05-29 DIAGNOSIS — Z79899 Other long term (current) drug therapy: Secondary | ICD-10-CM

## 2017-05-29 DIAGNOSIS — I447 Left bundle-branch block, unspecified: Secondary | ICD-10-CM | POA: Diagnosis present

## 2017-05-29 DIAGNOSIS — E785 Hyperlipidemia, unspecified: Secondary | ICD-10-CM | POA: Diagnosis present

## 2017-05-29 DIAGNOSIS — F05 Delirium due to known physiological condition: Secondary | ICD-10-CM | POA: Diagnosis not present

## 2017-05-29 DIAGNOSIS — K219 Gastro-esophageal reflux disease without esophagitis: Secondary | ICD-10-CM | POA: Diagnosis present

## 2017-05-29 DIAGNOSIS — Z8673 Personal history of transient ischemic attack (TIA), and cerebral infarction without residual deficits: Secondary | ICD-10-CM

## 2017-05-29 DIAGNOSIS — I248 Other forms of acute ischemic heart disease: Secondary | ICD-10-CM | POA: Diagnosis present

## 2017-05-29 DIAGNOSIS — I351 Nonrheumatic aortic (valve) insufficiency: Secondary | ICD-10-CM | POA: Diagnosis not present

## 2017-05-29 DIAGNOSIS — I951 Orthostatic hypotension: Secondary | ICD-10-CM | POA: Diagnosis not present

## 2017-05-29 DIAGNOSIS — S0012XA Contusion of left eyelid and periocular area, initial encounter: Secondary | ICD-10-CM | POA: Diagnosis present

## 2017-05-29 DIAGNOSIS — G20 Parkinson's disease: Secondary | ICD-10-CM | POA: Diagnosis present

## 2017-05-29 DIAGNOSIS — F329 Major depressive disorder, single episode, unspecified: Secondary | ICD-10-CM | POA: Diagnosis present

## 2017-05-29 DIAGNOSIS — I2510 Atherosclerotic heart disease of native coronary artery without angina pectoris: Secondary | ICD-10-CM | POA: Diagnosis present

## 2017-05-29 DIAGNOSIS — Z7982 Long term (current) use of aspirin: Secondary | ICD-10-CM

## 2017-05-29 DIAGNOSIS — I130 Hypertensive heart and chronic kidney disease with heart failure and stage 1 through stage 4 chronic kidney disease, or unspecified chronic kidney disease: Secondary | ICD-10-CM | POA: Diagnosis present

## 2017-05-29 DIAGNOSIS — Z886 Allergy status to analgesic agent status: Secondary | ICD-10-CM

## 2017-05-29 DIAGNOSIS — R410 Disorientation, unspecified: Secondary | ICD-10-CM | POA: Diagnosis not present

## 2017-05-29 DIAGNOSIS — R0989 Other specified symptoms and signs involving the circulatory and respiratory systems: Secondary | ICD-10-CM | POA: Diagnosis not present

## 2017-05-29 DIAGNOSIS — W1830XA Fall on same level, unspecified, initial encounter: Secondary | ICD-10-CM | POA: Diagnosis not present

## 2017-05-29 DIAGNOSIS — Z23 Encounter for immunization: Secondary | ICD-10-CM

## 2017-05-29 DIAGNOSIS — I255 Ischemic cardiomyopathy: Secondary | ICD-10-CM | POA: Diagnosis present

## 2017-05-29 DIAGNOSIS — Z66 Do not resuscitate: Secondary | ICD-10-CM | POA: Diagnosis present

## 2017-05-29 DIAGNOSIS — I340 Nonrheumatic mitral (valve) insufficiency: Secondary | ICD-10-CM | POA: Diagnosis not present

## 2017-05-29 DIAGNOSIS — F419 Anxiety disorder, unspecified: Secondary | ICD-10-CM | POA: Diagnosis present

## 2017-05-29 DIAGNOSIS — R4020 Unspecified coma: Secondary | ICD-10-CM | POA: Diagnosis not present

## 2017-05-29 DIAGNOSIS — I5042 Chronic combined systolic (congestive) and diastolic (congestive) heart failure: Secondary | ICD-10-CM | POA: Diagnosis present

## 2017-05-29 DIAGNOSIS — Z8546 Personal history of malignant neoplasm of prostate: Secondary | ICD-10-CM

## 2017-05-29 DIAGNOSIS — I6523 Occlusion and stenosis of bilateral carotid arteries: Secondary | ICD-10-CM | POA: Diagnosis not present

## 2017-05-29 DIAGNOSIS — R55 Syncope and collapse: Secondary | ICD-10-CM | POA: Diagnosis not present

## 2017-05-29 DIAGNOSIS — R778 Other specified abnormalities of plasma proteins: Secondary | ICD-10-CM

## 2017-05-29 DIAGNOSIS — S0993XA Unspecified injury of face, initial encounter: Secondary | ICD-10-CM | POA: Diagnosis not present

## 2017-05-29 DIAGNOSIS — R7989 Other specified abnormal findings of blood chemistry: Secondary | ICD-10-CM | POA: Diagnosis not present

## 2017-05-29 DIAGNOSIS — I420 Dilated cardiomyopathy: Secondary | ICD-10-CM | POA: Diagnosis not present

## 2017-05-29 DIAGNOSIS — G9341 Metabolic encephalopathy: Secondary | ICD-10-CM | POA: Diagnosis not present

## 2017-05-29 DIAGNOSIS — S0003XA Contusion of scalp, initial encounter: Secondary | ICD-10-CM | POA: Diagnosis present

## 2017-05-29 DIAGNOSIS — N183 Chronic kidney disease, stage 3 (moderate): Secondary | ICD-10-CM | POA: Diagnosis present

## 2017-05-29 DIAGNOSIS — N179 Acute kidney failure, unspecified: Secondary | ICD-10-CM | POA: Diagnosis present

## 2017-05-29 DIAGNOSIS — Z888 Allergy status to other drugs, medicaments and biological substances status: Secondary | ICD-10-CM

## 2017-05-29 DIAGNOSIS — W19XXXA Unspecified fall, initial encounter: Secondary | ICD-10-CM | POA: Diagnosis not present

## 2017-05-29 DIAGNOSIS — S0990XA Unspecified injury of head, initial encounter: Secondary | ICD-10-CM | POA: Diagnosis not present

## 2017-05-29 LAB — COMPREHENSIVE METABOLIC PANEL
ALBUMIN: 3.7 g/dL (ref 3.5–5.0)
ALT: <5 U/L — ABNORMAL LOW (ref 17–63)
AST: 31 U/L (ref 15–41)
Alkaline Phosphatase: 97 U/L (ref 38–126)
Anion gap: 13 (ref 5–15)
BUN: 40 mg/dL — AB (ref 6–20)
CHLORIDE: 103 mmol/L (ref 101–111)
CO2: 24 mmol/L (ref 22–32)
Calcium: 9.7 mg/dL (ref 8.9–10.3)
Creatinine, Ser: 1.95 mg/dL — ABNORMAL HIGH (ref 0.61–1.24)
GFR calc Af Amer: 33 mL/min — ABNORMAL LOW (ref >60)
GFR calc non Af Amer: 29 mL/min — ABNORMAL LOW (ref >60)
GLUCOSE: 156 mg/dL — AB (ref 65–99)
POTASSIUM: 4.1 mmol/L (ref 3.5–5.1)
Sodium: 140 mmol/L (ref 135–145)
Total Bilirubin: 0.6 mg/dL (ref 0.3–1.2)
Total Protein: 7.8 g/dL (ref 6.5–8.1)

## 2017-05-29 LAB — CBC WITH DIFFERENTIAL/PLATELET
Basophils Absolute: 0 10*3/uL (ref 0–0.1)
Basophils Relative: 0 %
EOS ABS: 0.1 10*3/uL (ref 0–0.7)
Eosinophils Relative: 1 %
HEMATOCRIT: 40.7 % (ref 40.0–52.0)
Hemoglobin: 13.5 g/dL (ref 13.0–18.0)
LYMPHS ABS: 2.5 10*3/uL (ref 1.0–3.6)
LYMPHS PCT: 30 %
MCH: 31.5 pg (ref 26.0–34.0)
MCHC: 33.2 g/dL (ref 32.0–36.0)
MCV: 94.9 fL (ref 80.0–100.0)
Monocytes Absolute: 1.1 10*3/uL — ABNORMAL HIGH (ref 0.2–1.0)
Monocytes Relative: 14 %
NEUTROS PCT: 55 %
Neutro Abs: 4.7 10*3/uL (ref 1.4–6.5)
PLATELETS: 246 10*3/uL (ref 150–440)
RBC: 4.28 MIL/uL — ABNORMAL LOW (ref 4.40–5.90)
RDW: 15.3 % — AB (ref 11.5–14.5)
WBC: 8.5 10*3/uL (ref 3.8–10.6)

## 2017-05-29 LAB — PROTIME-INR
INR: 0.94
Prothrombin Time: 12.5 seconds (ref 11.4–15.2)

## 2017-05-29 LAB — TROPONIN I
TROPONIN I: 0.08 ng/mL — AB (ref <0.03)
Troponin I: 0.1 ng/mL (ref <0.03)
Troponin I: 0.08 ng/mL (ref <0.03)

## 2017-05-29 LAB — TSH: TSH: 5.765 u[IU]/mL — AB (ref 0.350–4.500)

## 2017-05-29 LAB — MAGNESIUM: Magnesium: 1.8 mg/dL (ref 1.7–2.4)

## 2017-05-29 LAB — APTT

## 2017-05-29 MED ORDER — HEPARIN (PORCINE) IN NACL 100-0.45 UNIT/ML-% IJ SOLN
950.0000 [IU]/h | INTRAMUSCULAR | Status: DC
  Administered 2017-05-29: 950 [IU]/h via INTRAVENOUS
  Filled 2017-05-29: qty 250

## 2017-05-29 MED ORDER — ONDANSETRON HCL 4 MG/2ML IJ SOLN: 4.0000 mg | Freq: Four times a day (QID) | INTRAMUSCULAR | Status: DC | PRN

## 2017-05-29 MED ORDER — MAGNESIUM SULFATE 2 GM/50ML IV SOLN
2.0000 g | Freq: Once | INTRAVENOUS | Status: AC
  Administered 2017-05-29: 2 g via INTRAVENOUS
  Filled 2017-05-29: qty 50

## 2017-05-29 MED ORDER — ASPIRIN 81 MG PO CHEW
324.0000 mg | CHEWABLE_TABLET | ORAL | Status: AC
  Filled 2017-05-29: qty 4

## 2017-05-29 MED ORDER — ASPIRIN 300 MG RE SUPP: 300.0000 mg | RECTAL | Status: AC

## 2017-05-29 MED ORDER — NITROGLYCERIN 0.4 MG SL SUBL: 0.4000 mg | SUBLINGUAL_TABLET | SUBLINGUAL | Status: DC | PRN

## 2017-05-29 MED ORDER — SODIUM BICARBONATE 650 MG PO TABS
650.0000 mg | ORAL_TABLET | Freq: Two times a day (BID) | ORAL | Status: DC
Start: 2017-05-29 — End: 2017-05-31
  Administered 2017-05-29 – 2017-05-31 (×4): 650 mg via ORAL
  Filled 2017-05-29 (×5): qty 1

## 2017-05-29 MED ORDER — INFLUENZA VAC SPLIT HIGH-DOSE 0.5 ML IM SUSY
0.5000 mL | PREFILLED_SYRINGE | INTRAMUSCULAR | Status: AC
  Administered 2017-05-30: 0.5 mL via INTRAMUSCULAR
  Filled 2017-05-29: qty 0.5

## 2017-05-29 MED ORDER — CARBIDOPA-LEVODOPA 25-100 MG PO TABS
1.0000 | ORAL_TABLET | Freq: Three times a day (TID) | ORAL | Status: DC
  Administered 2017-05-29 – 2017-05-31 (×7): 1 via ORAL
  Filled 2017-05-29 (×8): qty 1

## 2017-05-29 MED ORDER — MIDODRINE HCL 5 MG PO TABS
5.0000 mg | ORAL_TABLET | Freq: Three times a day (TID) | ORAL | Status: DC
  Administered 2017-05-29 – 2017-05-30 (×3): 5 mg via ORAL
  Filled 2017-05-29 (×3): qty 1

## 2017-05-29 MED ORDER — CITALOPRAM HYDROBROMIDE 20 MG PO TABS
10.0000 mg | ORAL_TABLET | Freq: Two times a day (BID) | ORAL | Status: DC
  Administered 2017-05-29 – 2017-05-31 (×5): 10 mg via ORAL
  Filled 2017-05-29 (×5): qty 1

## 2017-05-29 MED ORDER — HEPARIN BOLUS VIA INFUSION
4000.0000 [IU] | Freq: Once | INTRAVENOUS | Status: AC
  Administered 2017-05-29: 4000 [IU] via INTRAVENOUS
  Filled 2017-05-29: qty 4000

## 2017-05-29 MED ORDER — METOPROLOL TARTRATE 25 MG PO TABS
12.5000 mg | ORAL_TABLET | Freq: Two times a day (BID) | ORAL | Status: DC
  Administered 2017-05-29 – 2017-05-30 (×2): 12.5 mg via ORAL
  Filled 2017-05-29 (×3): qty 1

## 2017-05-29 MED ORDER — NITROGLYCERIN 2 % TD OINT
0.5000 [in_us] | TOPICAL_OINTMENT | Freq: Once | TRANSDERMAL | Status: AC
  Administered 2017-05-29: 0.5 [in_us] via TOPICAL
  Filled 2017-05-29: qty 1

## 2017-05-29 MED ORDER — ALIGN 4 MG PO CAPS: 4.0000 mg | ORAL_CAPSULE | Freq: Every day | ORAL | Status: DC

## 2017-05-29 MED ORDER — ACETAMINOPHEN 325 MG PO TABS: 650.0000 mg | ORAL_TABLET | ORAL | Status: DC | PRN

## 2017-05-29 MED ORDER — ASPIRIN 81 MG PO CHEW
324.0000 mg | CHEWABLE_TABLET | Freq: Once | ORAL | Status: AC
  Administered 2017-05-29: 324 mg via ORAL
  Filled 2017-05-29: qty 4

## 2017-05-29 MED ORDER — ALLOPURINOL 100 MG PO TABS
200.0000 mg | ORAL_TABLET | Freq: Every day | ORAL | Status: DC
  Administered 2017-05-29 – 2017-05-31 (×3): 200 mg via ORAL
  Filled 2017-05-29 (×3): qty 2

## 2017-05-29 MED ORDER — RISAQUAD PO CAPS
1.0000 | ORAL_CAPSULE | Freq: Every day | ORAL | Status: DC
  Administered 2017-05-29 – 2017-05-31 (×3): 1 via ORAL
  Filled 2017-05-29 (×3): qty 1

## 2017-05-29 NOTE — ED Notes (Signed)
Pt taken to ct 

## 2017-05-29 NOTE — ED Triage Notes (Signed)
Pt states that he was in the bathroom attempting to shower and fell, pt denies passing out but states that he doesn't really remember what happened as to why he fell, pt has bruising at the rt eyebrow and small bruising noted to his abd. Pt is axox4

## 2017-05-29 NOTE — Progress Notes (Signed)
ANTICOAGULATION CONSULT NOTE - Initial Consult  Pharmacy Consult for Heparin Drip Indication: chest pain/ACS  Allergies  Allergen Reactions  . Lipitor [Atorvastatin Calcium]     Leg pain  . Nsaids   . Statins     Leg pain    Patient Measurements: Weight: 171 lb (77.6 kg) Heparin Dosing Weight: 77.6 kg  Vital Signs: Temp: 97.6 F (36.4 C) (10/19 1031) Temp Source: Oral (10/19 1031) BP: 161/89 (10/19 1330) Pulse Rate: 80 (10/19 1345)  Labs:  Recent Labs  05/29/17 1024 05/29/17 1225  HGB 13.5  --   HCT 40.7  --   PLT 246  --   APTT  --  <24*  LABPROT  --  12.5  INR  --  0.94  CREATININE 1.95*  --   TROPONINI 0.10*  --     Estimated Creatinine Clearance: 28.2 mL/min (A) (by C-G formula based on SCr of 1.95 mg/dL (H)).   Medical History: Past Medical History:  Diagnosis Date  . Coronary artery disease    NONOBSTRUCTIVE by cath 2008  . GERD (gastroesophageal reflux disease)   . History of prostate cancer   . History of transient ischemic attack (TIA)   . Hyperlipidemia   . Hypertension   . LBBB (left bundle branch block)   . Stroke ([place]) 02/2016   Hemorrhagic  . Syncope and collapse     Medications:  Scheduled:   Infusions:  . heparin 950 Units/hr (05/29/17 1311)    Assessment: 83 yo M to start Heparin drip for ACS/STEMI. Patient on ASA PTA. Hgb 13.5,  Plt 246   aPTT <24  INR 0.94  Goal of Therapy:  Heparin level 0.3-0.7 units/ml Monitor platelets by anticoagulation protocol: Yes   Plan:  Give 4000 units bolus x 1 Start heparin infusion at 950 units/hr Check anti-Xa level in 8 hours and daily while on heparin Continue to monitor H&H and platelets  Kelby Lotspeich A 05/29/2017,1:57 PM

## 2017-05-29 NOTE — ED Notes (Signed)
Pt transported to CT ?

## 2017-05-29 NOTE — H&P (Signed)
[facility] NAME: Keith Woods    MR#:  [id number]  DATE OF BIRTH:  [birth year]  DATE OF ADMISSION:  05/29/2017  PRIMARY CARE PHYSICIAN: [name], MD   REQUESTING/REFERRING PHYSICIAN: Merlyn Lot, MD  CHIEF COMPLAINT:   Chief Complaint  Patient presents with  . Fall   Syncope and fall today. HISTORY OF PRESENT ILLNESS:  Urban Naval  is a 83 y.o. male with a known history of CAD, hypertension, hyperlipidemia, CVA and LBBB.the patient passed out and fell while he was going to take shower today. He denies any symptoms before and after syncope. He was found diaphoretic pale and ill-appearing sitting on the commode. by EMS. He has hematoma on left eye brow. He denies any other injury. He denies any chest pain, palpitation or leg swelling. History of pneumonia level is elevated at 0.1. Dr. Quentin Cornwall suspected cardiac syncope and suggested starting heparin drip and admit patient.  PAST MEDICAL HISTORY:   Past Medical History:  Diagnosis Date  . Coronary artery disease    NONOBSTRUCTIVE by cath 2008  . GERD (gastroesophageal reflux disease)   . History of prostate cancer   . History of transient ischemic attack (TIA)   . Hyperlipidemia   . Hypertension   . LBBB (left bundle branch block)   . Stroke ([place]) 02/2016   Hemorrhagic  . Syncope and collapse     PAST SURGICAL HISTORY:   Past Surgical History:  Procedure Laterality Date  . CARDIAC CATHETERIZATION  08/28/2006   EF 60%  . TONSILLECTOMY    . TRANSTHORACIC ECHOCARDIOGRAM  10/10/2008   EF 55%    SOCIAL HISTORY:   Social History  Substance Use Topics  . Smoking status: Former Research scientist (life sciences)  . Smokeless tobacco: Never Used  . Alcohol use No    FAMILY HISTORY:   Family History  Problem Relation Age of Onset  . Ovarian cancer Mother   . Tuberculosis Mother   . Prostate cancer Father     DRUG ALLERGIES:   Allergies  Allergen Reactions  . Lipitor  [Atorvastatin Calcium]     Leg pain  . Nsaids   . Statins     Leg pain    REVIEW OF SYSTEMS:   Review of Systems  Constitutional: Negative for chills, fever and malaise/fatigue.  HENT: Negative for sore throat.   Eyes: Negative for blurred vision and double vision.  Respiratory: Negative for cough, hemoptysis, shortness of breath, wheezing and stridor.   Cardiovascular: Negative for chest pain, palpitations, orthopnea and leg swelling.  Gastrointestinal: Negative for abdominal pain, blood in stool, diarrhea, melena, nausea and vomiting.  Genitourinary: Negative for dysuria, flank pain and hematuria.  Musculoskeletal: Negative for back pain and joint pain.  Skin: Negative for rash.  Neurological: Positive for loss of consciousness. Negative for dizziness, sensory change, focal weakness, seizures, weakness and headaches.  Endo/Heme/Allergies: Negative for polydipsia.  Psychiatric/Behavioral: Negative for depression. The patient is not nervous/anxious.     MEDICATIONS AT HOME:   Prior to Admission medications   Medication Sig Start Date End Date Taking? Authorizing Provider  allopurinol (ZYLOPRIM) 100 MG tablet take 2 tablets by mouth once daily 07/05/16  Yes Fisher, Kirstie Peri, MD  aspirin EC 81 MG EC tablet Take 1 tablet (81 mg total) by mouth daily. 06/29/16  Yes Hugelmeyer, Alexis, DO  b complex vitamins tablet Take 1 tablet by mouth daily.   Yes [provider]  carbidopa-levodopa (SINEMET IR) 25-100  MG tablet Take 1 tablet by mouth 3 (three) times daily. 12/23/16  Yes [provider]  citalopram (CELEXA) 10 MG tablet take 1 tablet by mouth twice a day 02/23/17  Yes Fisher, Kirstie Peri, MD  midodrine (PROAMATINE) 5 MG tablet Take 5 mg by mouth 3 (three) times daily with meals.   Yes [provider]  Multiple Vitamin (MULTIVITAMIN) tablet Take 1 tablet by mouth daily.     Yes [provider]  Probiotic Product (ALIGN) 4 MG CAPS Take 4 mg by mouth  daily.   Yes [provider]  sodium bicarbonate 650 MG tablet Take 650 mg by mouth 2 (two) times daily. 01/05/15  Yes [provider]  Vitamin D, Ergocalciferol, (DRISDOL) 50000 units CAPS capsule TAKE 1 CAPSULE ONCE A WEEK 12/18/16  Yes [name], MD  Colchicine (MITIGARE) 0.6 MG CAPS Two tablets today, then 1 tablet daily 07/20/15   [name], MD  ezetimibe (ZETIA) 10 MG tablet Take 1 tablet (10 mg total) by mouth daily. Patient not taking: Reported on 05/07/2017 04/06/17   [name], MD  nitroGLYCERIN (NITROSTAT) 0.4 MG SL tablet Place 1 tablet (0.4 mg total) under the tongue every 5 (five) minutes as needed for chest pain. 04/23/11   Martinique, Peter M, MD      VITAL SIGNS:  Blood pressure (!) 150/90, pulse 73, temperature 97.6 F (36.4 C), temperature source Oral, resp. rate 20, weight 171 lb (77.6 kg), SpO2 97 %.  PHYSICAL EXAMINATION:  Physical Exam  GENERAL:  83 y.o.-year-old patient lying in the bed with no acute distress.  EYES: Pupils equal, round, reactive to light and accommodation. No scleral icterus. Extraocular muscles intact. Hematoma on left eye bow. HEENT: Head atraumatic, normocephalic. Oropharynx and nasopharynx clear.  NECK:  Supple, no jugular venous distention. No thyroid enlargement, no tenderness.  LUNGS: Normal breath sounds bilaterally, no wheezing, rales,rhonchi or crepitation. No use of accessory muscles of respiration.  CARDIOVASCULAR: S1, S2 normal. No murmurs, rubs, or gallops.  ABDOMEN: Soft, nontender, nondistended. Bowel sounds present. No organomegaly or mass.  EXTREMITIES: No pedal edema, cyanosis, or clubbing.  NEUROLOGIC: Cranial nerves II through XII are intact. Muscle strength 4/5 in all extremities. Sensation intact. Gait not checked.  PSYCHIATRIC: The patient is alert and oriented x 3.  SKIN: No obvious rash, lesion, or ulcer.   LABORATORY PANEL:   CBC  Recent Labs Lab 05/29/17 1024  WBC 8.5  HGB 13.5    HCT 40.7  PLT 246   ------------------------------------------------------------------------------------------------------------------  Chemistries   Recent Labs Lab 05/29/17 1024  NA 140  K 4.1  CL 103  CO2 24  GLUCOSE 156*  BUN 40*  CREATININE 1.95*  CALCIUM 9.7  AST 31  ALT <5*  ALKPHOS 97  BILITOT 0.6   ------------------------------------------------------------------------------------------------------------------  Cardiac Enzymes  Recent Labs Lab 05/29/17 1024  TROPONINI 0.10*   ------------------------------------------------------------------------------------------------------------------  RADIOLOGY:  Ct Head Wo Contrast  Result Date: 05/29/2017 CLINICAL DATA:  Recent fall in bathroom EXAM: CT HEAD WITHOUT CONTRAST TECHNIQUE: Contiguous axial images were obtained from the base of the skull through the vertex without intravenous contrast. COMPARISON:  08/24/15 FINDINGS: Brain: Diffuse atrophic changes are noted. Scattered chronic white matter ischemic changes are again seen. No findings to suggest acute hemorrhage, acute infarction or space-occupying mass lesion are noted. Prior lacunar infarcts are noted in the thalamus bilaterally Vascular: No hyperdense vessel or unexpected calcification. Skull: Normal. Negative for fracture or focal lesion. Sinuses/Orbits: No acute finding. Other:  Mild left forehead hematoma is seen. IMPRESSION: Chronic atrophic and ischemic changes without acute intracranial abnormality. Electronically Signed   By: Inez Catalina M.D.   On: 05/29/2017 10:45   Dg Chest Portable 1 View  Result Date: 05/29/2017 CLINICAL DATA:  Syncope. EXAM: PORTABLE CHEST 1 VIEW COMPARISON:  11/28/2016 . FINDINGS: Mediastinum and hilar structures normal. Questionable nodular density left upper lobe. This made the relate to the overlying EKG leads. Repeat chest x-ray without EKG leads suggested. No focal infiltrate. No pleural effusion or pneumothorax. Heart size  normal. No acute bony abnormality . IMPRESSION: Nodular densities projected over the left upper lung. These may be related to EKG leads . Repeat chest x-ray following removal of EKG leads suggested . Electronically Signed   By: Marcello Moores  Register   On: 05/29/2017 10:55      IMPRESSION AND PLAN:   Syncope, possible due to NSTEMI or cardiac arrhythmia. The patient will be admitted to telemetry floor. Start heparin drip, continue aspirin, start lopressor, follow-up troponin level and cardiology consult. Per Dr. Fletcher Anon, no cardiac cath this time.  CAD. continue heparin drip and aspirin. Severe LV systolic dysfunction EF 67% and LBBB. Follow-up cardiology consult.  HTN. Start lopressor.  ARF on CKD stage 3. Give gentle rehydration, follow-up BMP.  I discussed with Dr. Fletcher Anon.   All the records are reviewed and case discussed with ED provider. Management plans discussed with the patient, his daughter and they are in agreement.  CODE STATUS: full code  TOTAL TIME TAKING CARE OF THIS PATIENT: 53 minutes.    Demetrios Loll M.D on 05/29/2017 at 12:26 PM  Between 7am to 6pm - Pager - [phone]  After 6pm go to [email or web address] - Proofreader  [facility]  Office  [phone]  CC: Primary care physician; [name], MD   Note: This dictation was prepared with Dragon dictation along with smaller phrase technology. Any transcriptional errors that result from this process are unin

## 2017-05-29 NOTE — Consult Note (Signed)
Cardiology Consultation:   Patient ID: Keith Woods; [id number]; Dec 12, 1925   Admit date: 05/29/2017 Date of Consult: 05/29/2017  Primary Care Provider: [name], MD Primary Cardiologist: End   Patient Profile:   Keith Woods is a 83 y.o. male with a hx of chronic systolic CHF/presumed ischemic cardiomyopathy with EF of 25-30% by echocardiogram in 06/2016 (based on abnormal myocardial perfusion stress test in 03/2016), a prior remote cardiac catheterization in 2008 showing nonobstructive CAD, prior stroke, Parkinson's disease, hypertension, hyperlipidemia, recurrent syncope with known orthostatic hypotension, CKD stage III and multiple falls who is being seen today for the evaluation of elevated troponin and syncope at the request of Dr. Bridgett Larsson.  History of Present Illness:   Keith Woods was in his usual state of health when he was being assisted into the shower this morning and he suffered a syncopal episode.  Patient was accompanied by home health aide at the time of the syncopal episode who attempted to break the patient's fault with her knee but the patient suffered a left-sided head contusion.  Per prior notes caregiver reported when the patient came to he was incoherent with drooling noted.  Upon EMS arrival patient's blood pressure was noted to be low.  It is uncertain how long the patient was without consciousness or what his initial blood pressure was.  It is also uncertain what the patient's initial rhythm strip showed in the field.  Upon the patient's [facility] to [facility] they were found to have BP 150/90, HR 73 bpm, temp 97.6 F, oxygen saturation 97 % on room air, weight 171 pounds.  EKG showed sinus rhythm with known left bundle branch block, CXR showed no acute cardiopulmonary process.  CT head showed chronic atrophy and ischemic changes without acute intracranial abnormality.  Labs showed troponin 0 0.1 cm creatinine 1.95 (baseline 1.6-1.8), potassium 4.1,  WBC 8.5, hemoglobin 13.5, platelet count 246.  Patient has received IV magnesium sulfate 2 g in the ED.  He has been monitored on telemetry without evidence of arrhythmia.  Upon cardiology questioning the patient he reports he was in his usual state of health this morning and was preparing to take a shower with the assistance of his home health aide when he suffered a syncopal episode and the bathroom.  The patient had yet to start his shower though he does believe he was able to turn on the water.  Patient reports feeling diaphoretic preceding the syncopal episode though denies any chest pain, palpitations, shortness of breath, nausea, or vomiting.  He is uncertain how long he was out.  Following the syncopal episode he denied any chest pain, diaphoresis, shortness of breath, palpitations, nausea, vomiting, or loss of bowel/bladder function.  He presently feels at his baseline.  ______________ Prior remote left heart catheterization on 08/28/2006 showed left main normal, LAD with mild luminal irregularities, moderate sized ramus with mild luminal irregularities, codominant left circumflex with mild luminal irregularities, small to moderate sized codominant RCA with mild luminal irregularities.  LVEF 60%.  Transthoracic echocardiogram from 03/08/2016 showed normal LV size with severely reduced EF of 25-30% with diffuse hypokinesis, grade 1 diastolic dysfunction, mild mitral regurgitation, normal RV size and systolic function, trivial tricuspid regurgitation.  Lexiscan Myoview on 04/10/2016 revealed large, severe inferior and apical defect consistent with prior infarction.  No evidence of ischemia.  LVEF 26%.  Limited echocardiogram on 06/24/2016 showed normal LV size and wall thickness with LVEF of 25-30%, mild aortic regurgitation, normal RV size and  function.   Past Medical History:  Diagnosis Date  . Coronary artery disease    NONOBSTRUCTIVE by cath 2008  . GERD (gastroesophageal reflux disease)   .  History of prostate cancer   . History of transient ischemic attack (TIA)   . Hyperlipidemia   . Hypertension   . LBBB (left bundle branch block)   . Stroke ([place]) 02/2016   Hemorrhagic  . Syncope and collapse     Past Surgical History:  Procedure Laterality Date  . CARDIAC CATHETERIZATION  08/28/2006   EF 60%  . TONSILLECTOMY    . TRANSTHORACIC ECHOCARDIOGRAM  10/10/2008   EF 55%     Home Meds: Prior to Admission medications   Medication Sig Start Date End Date Taking? Authorizing Provider  allopurinol (ZYLOPRIM) 100 MG tablet take 2 tablets by mouth once daily 07/05/16  Yes Fisher, Kirstie Peri, MD  aspirin EC 81 MG EC tablet Take 1 tablet (81 mg total) by mouth daily. 06/29/16  Yes Hugelmeyer, Alexis, DO  b complex vitamins tablet Take 1 tablet by mouth daily.   Yes [provider]  carbidopa-levodopa (SINEMET IR) 25-100 MG tablet Take 1 tablet by mouth 3 (three) times daily. 12/23/16  Yes [provider]  citalopram (CELEXA) 10 MG tablet take 1 tablet by mouth twice a day 02/23/17  Yes Fisher, Kirstie Peri, MD  midodrine (PROAMATINE) 5 MG tablet Take 5 mg by mouth 3 (three) times daily with meals.   Yes [provider]  Multiple Vitamin (MULTIVITAMIN) tablet Take 1 tablet by mouth daily.     Yes [provider]  Probiotic Product (ALIGN) 4 MG CAPS Take 4 mg by mouth daily.   Yes [provider]  sodium bicarbonate 650 MG tablet Take 650 mg by mouth 2 (two) times daily. 01/05/15  Yes [provider]  Vitamin D, Ergocalciferol, (DRISDOL) 50000 units CAPS capsule TAKE 1 CAPSULE ONCE A WEEK 12/18/16  Yes [name], MD  Colchicine (MITIGARE) 0.6 MG CAPS Two tablets today, then 1 tablet daily 07/20/15   [name], MD  ezetimibe (ZETIA) 10 MG tablet Take 1 tablet (10 mg total) by mouth daily. Patient not taking: Reported on 05/07/2017 04/06/17   [name], MD  nitroGLYCERIN (NITROSTAT) 0.4 MG SL tablet Place 1 tablet (0.4 mg  total) under the tongue every 5 (five) minutes as needed for chest pain. 04/23/11   Martinique, Peter M, MD    Inpatient Medications: Scheduled Meds: . heparin  4,000 Units Intravenous Once   Continuous Infusions: . heparin    . magnesium sulfate 1 - 4 g bolus IVPB 2 g (05/29/17 1241)   PRN Meds:   Allergies:   Allergies  Allergen Reactions  . Lipitor [Atorvastatin Calcium]     Leg pain  . Nsaids   . Statins     Leg pain    Social History:   Social History   Social History  . Marital status: Married    Spouse name: N/A  . Number of children: 3  . Years of education: N/A   Occupational History  . minister-retired    Social History Main Topics  . Smoking status: Former Research scientist (life sciences)  . Smokeless tobacco: Never Used  . Alcohol use No  . Drug use: No  . Sexual activity: Not on file   Other Topics Concern  . Not on file   Social History Narrative  . No narrative on file     Family History:  Family History  Problem Relation Age of Onset  . Ovarian cancer Mother   . Tuberculosis Mother   . Prostate cancer Father     ROS:  Review of Systems  Constitutional: Positive for diaphoresis and malaise/fatigue. Negative for chills, fever and weight loss.  HENT: Negative for congestion.   Eyes: Negative for discharge and redness.  Respiratory: Negative for cough, hemoptysis, sputum production, shortness of breath and wheezing.   Cardiovascular: Negative for chest pain, palpitations, orthopnea, claudication, leg swelling and PND.  Gastrointestinal: Negative for abdominal pain, blood in stool, heartburn, melena, nausea and vomiting.  Genitourinary: Negative for hematuria.  Musculoskeletal: Positive for falls. Negative for myalgias.  Skin: Negative for rash.  Neurological: Positive for loss of consciousness, weakness and headaches. Negative for dizziness, tingling, tremors, sensory change, speech change, focal weakness and seizures.  Endo/Heme/Allergies: Does not bruise/bleed  easily.  Psychiatric/Behavioral: Negative for substance abuse. The patient is not nervous/anxious.   All other systems reviewed and are negative.     Physical Exam/Data:   Vitals:   05/29/17 1031 05/29/17 1220  BP: (!) 150/90   Pulse: 73   Resp: 20   Temp: 97.6 F (36.4 C)   TempSrc: Oral   SpO2: 97%   Weight:  171 lb (77.6 kg)   No intake or output data in the 24 hours ending 05/29/17 1312 Filed Weights   05/29/17 1220  Weight: 171 lb (77.6 kg)   Body mass index is 21.96 kg/m.   Physical Exam: General: Well developed, well nourished, in no acute distress. Head: Normocephalic, contusion above left eye, sclera non-icteric, no xanthomas, nares are without discharge. Neck: Negative for carotid bruits. JVD not elevated. Lungs: Clear bilaterally to auscultation without wheezes, rales, or rhonchi. Breathing is unlabored. Heart: RRR with S1 S2. No murmurs, rubs, or gallops appreciated. Abdomen: Soft, non-tender, non-distended with normoactive bowel sounds. No hepatomegaly. No rebound/guarding. No obvious abdominal masses. Msk:  Strength and tone appear normal for age. Extremities: No clubbing or cyanosis. No edema. Distal pedal pulses are 2+ and equal bilaterally. Neuro: Alert and oriented X 3. No facial asymmetry. No focal deficit. Moves all extremities spontaneously. Psych:  Responds to questions appropriately with a normal affect.   EKG:  The EKG was personally reviewed and demonstrates:  NSR< 75 bpm LBBB Telemetry:  Telemetry was personally reviewed and demonstrates:  NSR, LBBB  Weights: Filed Weights   05/29/17 1220  Weight: 171 lb (77.6 kg)    Relevant CV Studies: Non-Invasive Evaluation(s):  Transthoracic echocardiogram (03/08/16): Normal LV size with severely reduced contraction (EF 25-30%) with diffuse hypokinesis. Grade 1 diastolic dysfunction. Mild MR. Normal RV size and function. Trivial TR.  Pharmacologic myocardial perfusion stress test (04/10/16): Large,  severe inferior and apical defect consistent with prior infarction. No evidence of ischemia. LVEF 26%.  Limited echo (06/24/16): Normal LV size and wall thickness with LVEF of 25-30%. Mild aortic regurgitation. Normal RV size and function.  Cath/PCI:  LHC (08/28/06): LMCA normal. LAD with mild luminal irregularities. Moderate sized ramus with mild luminal irregularities. Codominant LCx with mild luminal irregularities. Small moderate sized codominant RCA with mild luminal irregularities. LVEF 60%.   Laboratory Data:  Chemistry Recent Labs Lab 05/29/17 1024  NA 140  K 4.1  CL 103  CO2 24  GLUCOSE 156*  BUN 40*  CREATININE 1.95*  CALCIUM 9.7  GFRNONAA 29*  GFRAA 33*  ANIONGAP 13     Recent Labs Lab 05/29/17 1024  PROT 7.8  ALBUMIN 3.7  AST 31  ALT <5*  ALKPHOS 97  BILITOT 0.6   Hematology Recent Labs Lab 05/29/17 1024  WBC 8.5  RBC 4.28*  HGB 13.5  HCT 40.7  MCV 94.9  MCH 31.5  MCHC 33.2  RDW 15.3*  PLT 246   Cardiac Enzymes Recent Labs Lab 05/29/17 1024  TROPONINI 0.10*    BNPNo results for input(s): BNP, PROBNP in the last 168 hours.  DDimer No results for input(s): DDIMER in the last 168 hours.  Radiology/Studies:  Ct Head Wo Contrast  Result Date: 05/29/2017 IMPRESSION: Chronic atrophic and ischemic changes without acute intracranial abnormality. Electronically Signed   By: Inez Catalina M.D.   On: 05/29/2017 10:45   Dg Chest Portable 1 View  Result Date: 05/29/2017 IMPRESSION: Nodular densities projected over the left upper lung. These may be related to EKG leads . Repeat chest x-ray following removal of EKG leads suggested . Electronically Signed   By: Marcello Moores  Register   On: 05/29/2017 10:55    Assessment and Plan:   1. Syncope: Of uncertain etiology at this time.  Given the patient's past medical history and surrounding events of today's syncopal episode this is certainly concerning for cardiac in etiology.  Unable to rule out possible VT  associated syncope.  Patient also has history of orthostatic hypotension and Parkinson's disease which may have also contributed to today's events.  Given initial troponin being mildly elevated as below I cannot rule out an ischemic event at this time either.  Continue to monitor on telemetry.  Patient has received IV magnesium sulfate in the ED.  Recommend checking magnesium level along with TSH.  Recommend echocardiogram and carotid Doppler.  If no significant arrhythmias are seen on telemetry, recommend outpatient cardiac monitoring.  No driving for 6 months.  2. Elevated troponin: Initial troponin mildly elevated at 0.1.  Continue to cycle until peak.  Started on heparin infusion by internal medicine.  Received full dose aspirin in the ED.  Check transthoracic echocardiogram.  Will likely need ischemic evaluation prior to discharge given troponin elevation and syncope, concerning for cardiac etiology.  3. ICM: He does not appear grossly volume up at this time. Orthostatic hypotension and sinus bradycardia have previously led to tapering/discontinuation of evidence-based heart failure medications. Continue prior to admission midodrine. He has otherwise not been able to consistently maintain evidence-based heart failure regimen.  Check transthoracic echocardiogram to evaluate for further reduction in LV systolic function.  4. CAD: As above.  Has been started on beta-blockade per internal medicine.  Monitor for hypotension/bradycardia given the patient's history.  5. Acute on CKD stage III: Gentle IV hydration recommended. Follow SCr/BUN.   6. HTN: BP well controlled currently.   7. Parkinson's disease: Per IM.   8. Orthostatic hypotension: Check orthostatic vitals as above prior to IV fluids.    For questions or updates, please contact [place] Please consult [email or web address] for contact info under Cardiology/STEMI.   Signed, Christell Faith, PA-C [place] Pager: [phone] 05/29/2017, 1:12 PM

## 2017-05-29 NOTE — ED Notes (Signed)
Pt family at bedside. Family member reports that pts caregiver was helping into the shower when the patient passed out. Caregiver broke pts fall with her knee but his head fell to the side and he hit his head on something. Caregiver stated that when pt came to he was incoherent initially and was drooling slightly. Family member also states that when EMS arrived pts blood pressure was low.

## 2017-05-29 NOTE — ED Provider Notes (Signed)
[facility] Emergency Department Provider Note    First MD Initiated Contact with Patient 05/29/17 1024     (approximate)  I have reviewed the triage vital signs and the nursing notes.   HISTORY  Chief Complaint Fall    HPI Keith Woods is a 83 y.o. male the history of stroke CAD and TIA presents via EMS after a syncopal event while the patient was on the commode today.  Patient was found by EMS diaphoretic pale and ill-appearing sitting on the commode.  Try to stand up but passed out.  At some point the patient had fallen as he does have swelling and a goose egg to his left eye.  Denies any pain or shortness of breath right now.  No diarrhea.  No abdominal pain.  Past Medical History:  Diagnosis Date  . Coronary artery disease    NONOBSTRUCTIVE by cath 2008  . GERD (gastroesophageal reflux disease)   . History of prostate cancer   . History of transient ischemic attack (TIA)   . Hyperlipidemia   . Hypertension   . LBBB (left bundle branch block)   . Stroke ([place]) 02/2016   Hemorrhagic  . Syncope and collapse    Family History  Problem Relation Age of Onset  . Ovarian cancer Mother   . Tuberculosis Mother   . Prostate cancer Father    Past Surgical History:  Procedure Laterality Date  . CARDIAC CATHETERIZATION  08/28/2006   EF 60%  . TONSILLECTOMY    . TRANSTHORACIC ECHOCARDIOGRAM  10/10/2008   EF 55%   Patient Active Problem List   Diagnosis Date Noted  . Chronic systolic heart failure ([place]) 01/01/2017  . Depression, recurrent ([place]) 09/23/2016  . Blood in urine 06/30/2016  . Orthostatic hypotension 06/24/2016  . Cardiomyopathy, ischemic 05/08/2016  . Severe left ventricular systolic dysfunction 37/85/8850  . Weakness of both legs 03/30/2016  . History of CVA (cerebrovascular accident) 03/08/2016  . Frequent falls 02/15/2016  . Parkinson's disease ([place]) 02/15/2016  . PMR (polymyalgia rheumatica) (HCC) 12/13/2015  . Cerebral  microvascular disease 12/13/2015  . Vitamin D deficiency 10/31/2015  . Anemia 10/31/2015  . Syncope 10/15/2015  . Gout 08/15/2015  . Fatigue 07/08/2015  . Right leg pain 05/18/2015  . Acute gout 05/18/2015  . Wrist pain 03/15/2015  . Chronic kidney disease 02/02/2015  . Vertigo 02/02/2015  . Hearing loss 02/02/2015  . H/O malignant neoplasm of prostate 12/06/2012  . LBBB (left bundle branch block)   . Labile hypertension   . Coronary artery disease   . Hyperlipidemia       Prior to Admission medications   Medication Sig Start Date End Date Taking? Authorizing Provider  allopurinol (ZYLOPRIM) 100 MG tablet take 2 tablets by mouth once daily 07/05/16   [name], MD  aspirin EC 81 MG EC tablet Take 1 tablet (81 mg total) by mouth daily. 06/29/16   Hugelmeyer, Alexis, DO  b complex vitamins tablet Take 1 tablet by mouth daily.    [provider]  Carbidopa-Levodopa ER (SINEMET CR) 25-100 MG tablet controlled release Take 1 tablet by mouth 2 (two) times daily. 03/10/16   [name], MD  citalopram (CELEXA) 10 MG tablet take 1 tablet by mouth twice a day 02/23/17   [name], MD  Colch[facility]) 0.6 MG CAPS Two tablets today, then 1 tablet daily 07/20/15   [name], MD  ezetimibe (ZETIA) 10 MG tablet Take 1 tablet (10 mg  total) by mouth daily. Patient not taking: Reported on 05/07/2017 04/06/17   [name], MD  midodrine (PROAMATINE) 5 MG tablet Take 5 mg by mouth 3 (three) times daily with meals.    [provider]  Multiple Vitamin (MULTIVITAMIN) tablet Take 1 tablet by mouth daily.      [provider]  nitroGLYCERIN (NITROSTAT) 0.4 MG SL tablet Place 1 tablet (0.4 mg total) under the tongue every 5 (five) minutes as needed for chest pain. Patient not taking: Reported on 05/07/2017 04/23/11   Martinique, Peter M, MD  Probiotic Product (ALIGN) 4 MG CAPS Take 4 mg by mouth daily.    [provider]  sodium bicarbonate 650 MG  tablet Take 650 mg by mouth 2 (two) times daily. 01/05/15   [provider]  Vitamin D, Ergocalciferol, (DRISDOL) 50000 units CAPS capsule TAKE 1 CAPSULE ONCE A WEEK 12/18/16   [name], MD    Allergies Lipitor [atorvastatin calcium]; Nsaids; and Statins    Social History Social History  Substance Use Topics  . Smoking status: Former Research scientist (life sciences)  . Smokeless tobacco: Never Used  . Alcohol use No    Review of Systems Patient denies headaches, rhinorrhea, blurry vision, numbness, shortness of breath, chest pain, edema, cough, abdominal pain, nausea, vomiting, diarrhea, dysuria, fevers, rashes or hallucinations unless otherwise stated above in HPI. ____________________________________________   PHYSICAL EXAM:  VITAL SIGNS: Vitals:   05/29/17 1031  BP: (!) 150/90  Pulse: 73  Resp: 20  Temp: 97.6 F (36.4 C)  SpO2: 97%    Constitutional: Alert and oriented. ill appearing and in no acute distress. Eyes: Conjunctivae are normal.  Head: ecchomysis and swelling to left eyebrow, no proptosis  Nose: No congestion/rhinnorhea. Mouth/Throat: Mucous membranes are moist.   Neck: No stridor. Painless ROM.  Cardiovascular: Normal rate, regular rhythm. Grossly normal heart sounds.  Good peripheral circulation. Respiratory: Normal respiratory effort.  No retractions. Lungs CTAB. Gastrointestinal: Soft and nontender. No distention. No abdominal bruits. No CVA tenderness. Musculoskeletal: No lower extremity tenderness nor edema.  No joint effusions. Neurologic:  Normal speech and language. No gross focal neurologic deficits are appreciated. No facial droop Skin:  Skin is warm, dry and intact. No rash noted. Psychiatric: Mood and affect are normal. Speech and behavior are normal.  ____________________________________________   LABS (all labs ordered are listed, but only abnormal results are displayed)  Results for orders placed or performed during the hospital encounter of  05/29/17 (from the past 24 hour(s))  CBC with Differential/Platelet     Status: Abnormal   Collection Time: 05/29/17 10:24 AM  Result Value Ref Range   WBC 8.5 3.8 - 10.6 K/uL   RBC 4.28 (L) 4.40 - 5.90 MIL/uL   Hemoglobin 13.5 13.0 - 18.0 g/dL   HCT 40.7 40.0 - 52.0 %   MCV 94.9 80.0 - 100.0 fL   MCH 31.5 26.0 - 34.0 pg   MCHC 33.2 32.0 - 36.0 g/dL   RDW 15.3 (H) 11.5 - 14.5 %   Platelets 246 150 - 440 K/uL   Neutrophils Relative % 55 %   Neutro Abs 4.7 1.4 - 6.5 K/uL   Lymphocytes Relative 30 %   Lymphs Abs 2.5 1.0 - 3.6 K/uL   Monocytes Relative 14 %   Monocytes Absolute 1.1 (H) 0.2 - 1.0 K/uL   Eosinophils Relative 1 %   Eosinophils Absolute 0.1 0 - 0.7 K/uL   Basophils Relative 0 %   Basophils Absolute 0.0 0 -  0.1 K/uL  Comprehensive metabolic panel     Status: Abnormal   Collection Time: 05/29/17 10:24 AM  Result Value Ref Range   Sodium 140 135 - 145 mmol/L   Potassium 4.1 3.5 - 5.1 mmol/L   Chloride 103 101 - 111 mmol/L   CO2 24 22 - 32 mmol/L   Glucose, Bld 156 (H) 65 - 99 mg/dL   BUN 40 (H) 6 - 20 mg/dL   Creatinine, Ser 1.95 (H) 0.61 - 1.24 mg/dL   Calcium 9.7 8.9 - 10.3 mg/dL   Total Protein 7.8 6.5 - 8.1 g/dL   Albumin 3.7 3.5 - 5.0 g/dL   AST 31 15 - 41 U/L   ALT <5 (L) 17 - 63 U/L   Alkaline Phosphatase 97 38 - 126 U/L   Total Bilirubin 0.6 0.3 - 1.2 mg/dL   GFR calc non Af Amer 29 (L) >60 mL/min   GFR calc Af Amer 33 (L) >60 mL/min   Anion gap 13 5 - 15  Troponin I     Status: Abnormal   Collection Time: 05/29/17 10:24 AM  Result Value Ref Range   Troponin I 0.10 (HH) <0.03 ng/mL  Magnesium     Status: None   Collection Time: 05/29/17 10:24 AM  Result Value Ref Range   Magnesium 1.8 1.7 - 2.4 mg/dL  TSH     Status: Abnormal   Collection Time: 05/29/17 10:24 AM  Result Value Ref Range   TSH 5.765 (H) 0.350 - 4.500 uIU/mL  Protime-INR     Status: None   Collection Time: 05/29/17 12:25 PM  Result Value Ref Range   Prothrombin Time 12.5 11.4 -  15.2 seconds   INR 0.94   APTT     Status: Abnormal   Collection Time: 05/29/17 12:25 PM  Result Value Ref Range   aPTT <24 (L) 24 - 36 seconds  Troponin I     Status: Abnormal   Collection Time: 05/29/17  2:32 PM  Result Value Ref Range   Troponin I 0.08 (HH) <0.03 ng/mL   ____________________________________________  EKG My review and personal interpretation at Time: 10:27   Indication: syncope  Rate: 75  Rhythm: sinus Axis: left Other: borderline prolonged QT, no sgarbossa criteria ____________________________________________  RADIOLOGY  I personally reviewed all radiographic images ordered to evaluate for the above acute complaints and reviewed radiology reports and findings.  These findings were personally discussed with the patient.  Please see medical record for radiology report.  ____________________________________________   PROCEDURES  Procedure(s) performed:  Procedures    Critical Care performed: yes CRITICAL CARE Performed by: Merlyn Lot   Total critical care time: 40 minutes  Critical care time was exclusive of separately billable procedures and treating other patients.  Critical care was necessary to treat or prevent imminent or life-threatening deterioration.  Critical care was time spent personally by me on the following activities: development of treatment plan with patient and/or surrogate as well as nursing, discussions with consultants, evaluation of patient's response to treatment, examination of patient, obtaining history from patient or surrogate, ordering and performing treatments and interventions, ordering and review of laboratory studies, ordering and review of radiographic studies, pulse oximetry and re-evaluation of patient's condition.  ____________________________________________   INITIAL IMPRESSION / ASSESSMENT AND PLAN / ED COURSE  Pertinent labs & imaging results that were available during my care of the patient were  reviewed by me and considered in my medical decision making (see chart for details).  DDX: dysrhytmia,  dehydration, vasovagal, GIB, acs, pna  Durwin Davisson is a 83 y.o. who presents to the ED with symptoms as described above.  Patient is ill-appearing but currently protecting his airway and hemodynamically stable.  Abdominal exam soft and benign.  Do not believe this is clinically consistent with acute blood loss anemia.  Based on his risk factors and history my primary concern is obtaining CT imaging of the head to rule out traumatic brain injury as well as cardiac workup for cardiac syncope as the patient does have a previous echo showing depressed EF of roughly 25%. The patient will be placed on continuous pulse oximetry and telemetry for monitoring.  Laboratory evaluation will be sent to evaluate for the above complaints.     Clinical Course as of May 29 1530  Fri May 29, 2017  1143 Radiographs are reassuring but the patient does have elevated troponin in light of his cardiac history with concerning syncope features I am concern for primary cardiac syncope.  Does have some nonspecific changes on EKG patient was given aspirin, IV magnesium will initiate heparinization.  She will require admission the hospital for further hemodynamic monitoring.  [PR]    Clinical Course User Index [PR] Merlyn Lot, MD     ____________________________________________   FINAL CLINICAL IMPRESSION(S) / ED DIAGNOSES  Final diagnoses:  Syncope and collapse  Elevated troponin I level      NEW MEDICATIONS STARTED DURING THIS VISIT:  New Prescriptions   No medications on file     Note:  This document was prepared using Dragon voice recognition software and may include unintentional dictation errors.    Merlyn Lot, MD 05/29/17 [phone]

## 2017-05-30 ENCOUNTER — Inpatient Hospital Stay: Payer: Medicare Other

## 2017-05-30 ENCOUNTER — Inpatient Hospital Stay (HOSPITAL_COMMUNITY)
Admit: 2017-05-30 | Discharge: 2017-05-30 | Disposition: A | Discharge status: Home / Self Care | Payer: Medicare Other | Attending: Physician Assistant | Admitting: Physician Assistant

## 2017-05-30 DIAGNOSIS — I2510 Atherosclerotic heart disease of native coronary artery without angina pectoris: Secondary | ICD-10-CM

## 2017-05-30 DIAGNOSIS — I340 Nonrheumatic mitral (valve) insufficiency: Secondary | ICD-10-CM

## 2017-05-30 DIAGNOSIS — G20 Parkinson's disease: Secondary | ICD-10-CM

## 2017-05-30 DIAGNOSIS — I420 Dilated cardiomyopathy: Secondary | ICD-10-CM | POA: Diagnosis present

## 2017-05-30 DIAGNOSIS — I951 Orthostatic hypotension: Secondary | ICD-10-CM

## 2017-05-30 DIAGNOSIS — R0989 Other specified symptoms and signs involving the circulatory and respiratory systems: Secondary | ICD-10-CM

## 2017-05-30 DIAGNOSIS — R55 Syncope and collapse: Secondary | ICD-10-CM

## 2017-05-30 DIAGNOSIS — I351 Nonrheumatic aortic (valve) insufficiency: Secondary | ICD-10-CM

## 2017-05-30 DIAGNOSIS — I5042 Chronic combined systolic (congestive) and diastolic (congestive) heart failure: Secondary | ICD-10-CM

## 2017-05-30 DIAGNOSIS — I447 Left bundle-branch block, unspecified: Secondary | ICD-10-CM

## 2017-05-30 LAB — CBC
HEMATOCRIT: 38.7 % — AB (ref 40.0–52.0)
HEMOGLOBIN: 13.1 g/dL (ref 13.0–18.0)
MCH: 32 pg (ref 26.0–34.0)
MCHC: 33.8 g/dL (ref 32.0–36.0)
MCV: 94.5 fL (ref 80.0–100.0)
Platelets: 263 10*3/uL (ref 150–440)
RBC: 4.09 MIL/uL — AB (ref 4.40–5.90)
RDW: 15.4 % — ABNORMAL HIGH (ref 11.5–14.5)
WBC: 8.1 10*3/uL (ref 3.8–10.6)

## 2017-05-30 LAB — BLOOD GAS, ARTERIAL
ALLENS TEST (PASS/FAIL): UNDETERMINED — AB
Acid-Base Excess: 1.1 mmol/L (ref 0.0–2.0)
BICARBONATE: 25.1 mmol/L (ref 20.0–28.0)
FIO2: 0.21
O2 Saturation: 94 %
PCO2 ART: 37 mmHg (ref 32.0–48.0)
PH ART: 7.44 (ref 7.350–7.450)
PO2 ART: 68 mmHg — AB (ref 83.0–108.0)
Patient temperature: 37

## 2017-05-30 LAB — URINALYSIS, COMPLETE (UACMP) WITH MICROSCOPIC
BACTERIA UA: NONE SEEN
BILIRUBIN URINE: NEGATIVE
Glucose, UA: NEGATIVE mg/dL
Hgb urine dipstick: NEGATIVE
Ketones, ur: NEGATIVE mg/dL
LEUKOCYTES UA: NEGATIVE
NITRITE: NEGATIVE
PH: 7 (ref 5.0–8.0)
Protein, ur: NEGATIVE mg/dL
SQUAMOUS EPITHELIAL / LPF: NONE SEEN
Specific Gravity, Urine: 1.01 (ref 1.005–1.030)
WBC UA: NONE SEEN WBC/hpf (ref 0–5)

## 2017-05-30 LAB — ECHOCARDIOGRAM COMPLETE
Height: 74 in
Weight: 2736 oz

## 2017-05-30 MED ORDER — PERFLUTREN LIPID MICROSPHERE
1.0000 mL | INTRAVENOUS | Status: AC | PRN
  Administered 2017-05-30: 10 mL via INTRAVENOUS
  Filled 2017-05-30: qty 10

## 2017-05-30 MED ORDER — MIDODRINE HCL 5 MG PO TABS: 10.0000 mg | ORAL_TABLET | Freq: Three times a day (TID) | ORAL | Status: DC

## 2017-05-30 MED ORDER — MIDODRINE HCL 5 MG PO TABS
5.0000 mg | ORAL_TABLET | Freq: Three times a day (TID) | ORAL | Status: DC
  Administered 2017-05-31: 5 mg via ORAL
  Filled 2017-05-30: qty 1

## 2017-05-30 NOTE — Progress Notes (Signed)
PT Cancellation Note  Patient Details Name: Keith Woods MRN: [id number] DOB: [birth year]   Cancelled Treatment:    Reason Eval/Treat Not Completed: Other (comment).  Pt is in bed with family in asking PT to wait due to his BP dropping and talked with PT about coming back tomorrow.  Will try again in the AM.    Ramond Dial 05/30/2017, 4:01 PM   Mee Hives, PT MS Acute Rehab Dept. Number: [place] and [place]

## 2017-05-30 NOTE — Progress Notes (Signed)
[place] [facility] [place] NAME: Keith Woods    MR#:  [id number]  DATE OF BIRTH:  1926-03-12  SUBJECTIVE:  CHIEF COMPLAINT:   Chief Complaint  Patient presents with  . Fall   - Admitted with fall and positive orthostatics. -Blood pressure is improved today. Known history of orthostatic hypotension. Physical therapy consult is pending.  REVIEW OF SYSTEMS:  Review of Systems  Constitutional: Negative for chills and fever.  HENT: Negative for congestion, ear discharge, hearing loss and nosebleeds.   Eyes: Negative for blurred vision, double vision and photophobia.  Respiratory: Negative for cough, shortness of breath and wheezing.   Cardiovascular: Negative for chest pain, palpitations and leg swelling.  Gastrointestinal: Negative for abdominal pain, constipation, diarrhea, nausea and vomiting.  Genitourinary: Negative for dysuria.  Musculoskeletal: Negative for myalgias.  Neurological: Positive for dizziness. Negative for speech change, focal weakness, seizures and headaches.  Psychiatric/Behavioral: Negative for depression.    DRUG ALLERGIES:   Allergies  Allergen Reactions  . Lipitor [Atorvastatin Calcium]     Leg pain  . Nsaids   . Statins     Leg pain    VITALS:  Blood pressure (!) 152/83, pulse 67, temperature 97.8 F (36.6 C), temperature source Oral, resp. rate 15, height 6\' 2"  (1.88 m), weight 77.6 kg (171 lb), SpO2 98 %.  PHYSICAL EXAMINATION:  Physical Exam  GENERAL:  83 y.o.-year-old patient lying in the bed with no acute distress.  EYES: Pupils equal, round, reactive to light and accommodation. No scleral icterus. Extraocular muscles intact.  HEENT: Head atraumatic, normocephalic. Bruising noted around his left eye orbit. Oropharynx and nasopharynx clear.  NECK:  Supple, no jugular venous distention. No thyroid enlargement, no tenderness.  LUNGS: Normal breath sounds bilaterally, no wheezing, rales,rhonchi or  crepitation. No use of accessory muscles of respiration.  CARDIOVASCULAR: S1, S2 normal. No rubs, or gallops. 2/6 systolic murmur present ABDOMEN: Soft, nontender, nondistended. Bowel sounds present. No organomegaly or mass.  EXTREMITIES: No pedal edema, cyanosis, or clubbing.  NEUROLOGIC: Cranial nerves II through XII are intact. Muscle strength 5/5 in all extremities. Sensation intact. Gait not checked.  PSYCHIATRIC: The patient is alert and oriented x 3.  SKIN: No obvious rash, lesion, or ulcer.    LABORATORY PANEL:   CBC  Recent Labs Lab 05/30/17 0437  WBC 8.1  HGB 13.1  HCT 38.7*  PLT 263   ------------------------------------------------------------------------------------------------------------------  Chemistries   Recent Labs Lab 05/29/17 1024  NA 140  K 4.1  CL 103  CO2 24  GLUCOSE 156*  BUN 40*  CREATININE 1.95*  CALCIUM 9.7  MG 1.8  AST 31  ALT <5*  ALKPHOS 97  BILITOT 0.6   ------------------------------------------------------------------------------------------------------------------  Cardiac Enzymes  Recent Labs Lab 05/29/17 2042  TROPONINI 0.08*   ------------------------------------------------------------------------------------------------------------------  RADIOLOGY:  Ct Head Wo Contrast  Result Date: 05/29/2017 CLINICAL DATA:  Recent fall in bathroom EXAM: CT HEAD WITHOUT CONTRAST TECHNIQUE: Contiguous axial images were obtained from the base of the skull through the vertex without intravenous contrast. COMPARISON:  08/24/15 FINDINGS: Brain: Diffuse atrophic changes are noted. Scattered chronic white matter ischemic changes are again seen. No findings to suggest acute hemorrhage, acute infarction or space-occupying mass lesion are noted. Prior lacunar infarcts are noted in the thalamus bilaterally Vascular: No hyperdense vessel or unexpected calcification. Skull: Normal. Negative for fracture or focal lesion. Sinuses/Orbits: No acute  finding. Other: Mild left forehead hematoma is seen. IMPRESSION: Chronic atrophic and ischemic changes  without acute intracranial abnormality. Electronically Signed   By: Inez Catalina M.D.   On: 05/29/2017 10:45   US Carotid Bilateral  Result Date: 05/30/2017 CLINICAL DATA:  Syncope EXAM: BILATERAL CAROTID DUPLEX ULTRASOUND TECHNIQUE: Pearline Cables scale imaging, color Doppler and duplex ultrasound were performed of bilateral carotid and vertebral arteries in the neck. COMPARISON:  03/08/2016 FINDINGS: Criteria: Quantification of carotid stenosis is based on velocity parameters that correlate the residual internal carotid diameter with NASCET-based stenosis levels, using the diameter of the distal internal carotid lumen as the denominator for stenosis measurement. The following velocity measurements were obtained: RIGHT ICA:  53/19 cm/sec CCA:  85/63 cm/sec SYSTOLIC ICA/CCA RATIO:  0.8 DIASTOLIC ICA/CCA RATIO:  1.9 ECA:  90 cm/sec LEFT ICA:  49/17 cm/sec CCA:  14/97 cm/sec SYSTOLIC ICA/CCA RATIO:  0.9 DIASTOLIC ICA/CCA RATIO:  1.4 ECA:  74 cm/sec RIGHT CAROTID ARTERY: Minor atherosclerotic plaque formation. No hemodynamically significant right ICA stenosis, velocity elevation, or turbulent flow. Degree of narrowing less than 50%. RIGHT VERTEBRAL ARTERY:  Antegrade LEFT CAROTID ARTERY: Similar scattered minor atherosclerotic plaque formation. No hemodynamically significant left ICA stenosis, velocity elevation, or turbulent flow. LEFT VERTEBRAL ARTERY:  Antegrade IMPRESSION: Very minor carotid atherosclerosis. No hemodynamically significant ICA stenosis. Degree of narrowing less than 50% bilaterally. Patent antegrade vertebral flow bilaterally Electronically Signed   By: Jerilynn Mages.  Shick M.D.   On: 05/30/2017 08:47   Dg Chest Portable 1 View  Result Date: 05/29/2017 CLINICAL DATA:  Syncope. EXAM: PORTABLE CHEST 1 VIEW COMPARISON:  11/28/2016 . FINDINGS: Mediastinum and hilar structures normal. Questionable nodular density  left upper lobe. This made the relate to the overlying EKG leads. Repeat chest x-ray without EKG leads suggested. No focal infiltrate. No pleural effusion or pneumothorax. Heart size normal. No acute bony abnormality . IMPRESSION: Nodular densities projected over the left upper lung. These may be related to EKG leads . Repeat chest x-ray following removal of EKG leads suggested . Electronically Signed   By: Marcello Moores  Register   On: 05/29/2017 10:55    EKG:   Orders placed or performed during the hospital encounter of 05/29/17  . ED EKG  . ED EKG  . EKG 12-Lead  . EKG 12-Lead    ASSESSMENT AND PLAN:   83 year old male with past medical history significant for known history of orthostatic hypertension, coronary artery disease, cardiomyopathy with EF 25%, Parkinson's disease, CK D stage III comes to the hospital secondary to a syncopal episode.  #1 syncope-likely vasovagal, secondary to orthostatic hypotension. -Admitted to rule out cardiogenic syncope. No arrhythmias noted on the monitor. -Appreciate cardiology consult. Troponin has plateaued, likely demand ischemia. -Outpatient 30 day monitor recommended -Ischemic workup as outpatient as needed - Echocardiogram is pending. -Carotid Dopplers with no hemodynamically significant stenosis. -CT of the head showing only chronic ischemic changes and atrophy. No acute findings.  #2 orthostatic hypertension-continue midodrine  #3 Parkinson's disease-on Sinemet  #4 depression and anxiety-continue Celexa  #5 DVT prophylaxis-Ted's and SCDs due to recent fall and head injury.  Physical therapy consult is pending.   All the records are reviewed and case discussed with Care Management/Social Workerr. Management plans discussed with the patient, family and they are in agreement.  CODE STATUS: Full Code  TOTAL TIME TAKING CARE OF THIS PATIENT: 37 minutes.   POSSIBLE D/C TODAY, DEPENDING ON CLINICAL CONDITION.   Sonam Wandel M.D on  05/30/2017 at 11:24 AM  Between 7am to 6pm - Pager - [phone]  After 6pm go to [email or web address] - password [email or web address]  [facility]  Office  [phone]  CC: Primary care physician; [name], MD

## 2017-05-30 NOTE — Progress Notes (Signed)
Progress Note  Patient Name: Keith Woods Date of Encounter: 05/30/2017  Primary Cardiologist: Dr. Saunders Revel Pimary Attendng: Gladstone Lighter, MD   Patient Profile     83 y.o. male with a history of known chronic combined systolic and diastolic heart failure (EF ~25-35%: presumably mixed ischemic and nonischemic from possible CAD - no recent ischemic evaluation, but large infarct noted on Myoview 03/2016, known LBBB), long-standing Parkinson's disease with orthostatic hypotension and multiple other comorbid morbidities including CK D-3, prior CVA, HLD .  He has had recurrent syncope and multiple falls. He presented on October 19 with an episode of syncope and collapse resulting in falling and hitting his left eye (large ecchymosis/hematoma left eyebrow from contusion). Apparently this occurred following urination, and prior to getting into his shower.  When found by EMS, he was pale and diaphoretic.He denied any chest tightness pressure or palpitations leading up to this episode. In fact he denied any sensation of rapid irregular heartbeats or palpitations either. He simply "blacked out". This is the first syncopal episode and I will see year, but he has had multiple falls due to dizziness. Dr. Saunders Revel has stopped his beta blocker because of fatigue and dizziness the past.  Because of mild troponin elevation, he was started on IV heparin -- stopped.  Principal Problem:   Syncope Active Problems:   Orthostatic hypotension   Chronic combined systolic and diastolic heart failure (HCC)   Dilated cardiomyopathy (HCC) - Presumed to be Ischemic; EF 25-30%   Labile hypertension   LBBB (left bundle branch block)   Parkinson's disease ([place])   Subjective   Feels great today. Eating full meal.No further dizziness or lightheadedness. However he has not done much walking around since admission.    Inpatient Medications    Scheduled Meds: . acidophilus  1 capsule Oral Daily  . allopurinol   200 mg Oral Daily  . aspirin  324 mg Oral NOW   Or  . aspirin  300 mg Rectal NOW  . carbidopa-levodopa  1 tablet Oral TID  . citalopram  10 mg Oral BID  . midodrine  5 mg Oral TID WC  . sodium bicarbonate  650 mg Oral BID   Continuous Infusions:  PRN Meds: acetaminophen, nitroGLYCERIN, ondansetron (ZOFRAN) IV   Vital Signs    Vitals:   05/29/17 2024 05/30/17 0547 05/30/17 0834 05/30/17 1219  BP: 125/69 134/81 (!) 152/83 124/76  Pulse: 67 63 67 (!) 58  Resp: 18 18 15 18   Temp: 98.6 F (37 C) 98 F (36.7 C) 97.8 F (36.6 C)   TempSrc: Oral  Oral   SpO2: 95% 92% 98% 95%  Weight:      Height:        Intake/Output Summary (Last 24 hours) at 05/30/17 1247 Last data filed at 05/30/17 0900  Gross per 24 hour  Intake              360 ml  Output                0 ml  Net              360 ml   Filed Weights   05/29/17 1220  Weight: 171 lb (77.6 kg)    Telemetry    Unable to interpret because the patient is moving. Too much background noise. For the most part the lateral portions reveals sinus rhythm. - Personally Reviewed  ECG    No new EKG - Personally Reviewed  Physical Exam  GEN: No acute distress.  esting comfortably in bed. Eating his dinner.  HEENT/Neck: normocephalic, traumatic contusion with ecchymosis on the left eye socket --  including the whole socket.no other significant bruising. Extraocular muscles muscles remain intact and clear conjunctiva. Cardiac: RRR,(distant S1 and S2) no murmurs, rubs, or gallops.  Respiratory: Clear to auscultation bilaterally. Nonlabored, good air movement; no W/R/R GI: Soft, nontender, non-distended  MS: No edema; No deformity. Neuro:  Nonfocal. No tremor noted Psych: pleasant mood and affect   Labs    Chemistry Recent Labs Lab 05/29/17 1024  NA 140  K 4.1  CL 103  CO2 24  GLUCOSE 156*  BUN 40*  CREATININE 1.95*  CALCIUM 9.7  PROT 7.8  ALBUMIN 3.7  AST 31  ALT <5*  ALKPHOS 97  BILITOT 0.6  GFRNONAA 29*    GFRAA 33*  ANIONGAP 13     Hematology Recent Labs Lab 05/29/17 1024 05/30/17 0437  WBC 8.5 8.1  RBC 4.28* 4.09*  HGB 13.5 13.1  HCT 40.7 38.7*  MCV 94.9 94.5  MCH 31.5 32.0  MCHC 33.2 33.8  RDW 15.3* 15.4*  PLT 246 263    Cardiac Enzymes Recent Labs Lab 05/29/17 1024 05/29/17 1432 05/29/17 2042  TROPONINI 0.10* 0.08* 0.08*   No results for input(s): TROPIPOC in the last 168 hours.   BNPNo results for input(s): BNP, PROBNP in the last 168 hours.   DDimer No results for input(s): DDIMER in the last 168 hours.   Radiology    Ct Head Wo Contrast  Result Date: 05/29/2017 CLINICAL DATA:  Recent fall in bathroom EXAM: CT HEAD WITHOUT CONTRAST TECHNIQUE: Contiguous axial images were obtained from the base of the skull through the vertex without intravenous contrast. COMPARISON:  08/24/15 FINDINGS: Brain: Diffuse atrophic changes are noted. Scattered chronic white matter ischemic changes are again seen. No findings to suggest acute hemorrhage, acute infarction or space-occupying mass lesion are noted. Prior lacunar infarcts are noted in the thalamus bilaterally Vascular: No hyperdense vessel or unexpected calcification. Skull: Normal. Negative for fracture or focal lesion. Sinuses/Orbits: No acute finding. Other: Mild left forehead hematoma is seen. IMPRESSION: Chronic atrophic and ischemic changes without acute intracranial abnormality. Electronically Signed   By: Inez Catalina M.D.   On: 05/29/2017 10:45   US Carotid Bilateral  Result Date: 05/30/2017 CLINICAL DATA:  Syncope EXAM: BILATERAL CAROTID DUPLEX ULTRASOUND TECHNIQUE: Pearline Cables scale imaging, color Doppler and duplex ultrasound were performed of bilateral carotid and vertebral arteries in the neck. COMPARISON:  03/08/2016 FINDINGS: Criteria: Quantification of carotid stenosis is based on velocity parameters that correlate the residual internal carotid diameter with NASCET-based stenosis levels, using the diameter of the distal  internal carotid lumen as the denominator for stenosis measurement. The following velocity measurements were obtained: RIGHT ICA:  53/19 cm/sec CCA:  19/37 cm/sec SYSTOLIC ICA/CCA RATIO:  0.8 DIASTOLIC ICA/CCA RATIO:  1.9 ECA:  90 cm/sec LEFT ICA:  49/17 cm/sec CCA:  90/24 cm/sec SYSTOLIC ICA/CCA RATIO:  0.9 DIASTOLIC ICA/CCA RATIO:  1.4 ECA:  74 cm/sec RIGHT CAROTID ARTERY: Minor atherosclerotic plaque formation. No hemodynamically significant right ICA stenosis, velocity elevation, or turbulent flow. Degree of narrowing less than 50%. RIGHT VERTEBRAL ARTERY:  Antegrade LEFT CAROTID ARTERY: Similar scattered minor atherosclerotic plaque formation. No hemodynamically significant left ICA stenosis, velocity elevation, or turbulent flow. LEFT VERTEBRAL ARTERY:  Antegrade IMPRESSION: Very minor carotid atherosclerosis. No hemodynamically significant ICA stenosis. Degree of narrowing less than 50% bilaterally. Patent antegrade vertebral flow bilaterally Electronically Signed  By: Eugenie Filler M.D.   On: 05/30/2017 08:47   Dg Chest Portable 1 View  Result Date: 05/29/2017 CLINICAL DATA:  Syncope. EXAM: PORTABLE CHEST 1 VIEW COMPARISON:  11/28/2016 . FINDINGS: Mediastinum and hilar structures normal. Questionable nodular density left upper lobe. This made the relate to the overlying EKG leads. Repeat chest x-ray without EKG leads suggested. No focal infiltrate. No pleural effusion or pneumothorax. Heart size normal. No acute bony abnormality . IMPRESSION: Nodular densities projected over the left upper lung. These may be related to EKG leads . Repeat chest x-ray following removal of EKG leads suggested . Electronically Signed   By: Marcello Moores  Register   On: 05/29/2017 10:55    Cardiac Studies   Echocardiogram has been ordered, but not done.  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    I would not continue beta blocker as this was stopped by his PCP. Would allow for permissive hypertension.  I don't think any further cardiac evaluation is required.  He remains on stable dose of midodrine which could be increased to 10 mg 3 times a day for discharge.  Would consider Northera as OP for nOH.  I do not think that the mild troponin elevation is at all related to an ischemic event. Most likely demand ischemia from underlying cardiomyopathy. Would not further evaluate for ischemic etiology standpoint. I agree with no anticoagulation beyond what he is would have been on as an outpatient.  He will need follow-up with either Dr. Saunders Revel or Laurine Blazer   For questions or updates, please contact [place] Please consult [email or web address] for contact info under Cardiology/STEMI.      Signed, Glenetta Hew, MD  05/30/2017, 12:47 PM

## 2017-05-30 NOTE — Progress Notes (Signed)
Pt. Daughter stated that pt "woke up from nap and didn't know where he was, he thought he was at home" pt. Daughter proceeded to help pt. To bathroom to void. Upon returning to bed pt. Was weak and tired/dizzy per daughters report. Upon assessment pt. A&O x4, VSS, notified MD, Kalisetti, orders placed for increase in Midodrine dosing for orthostatic hypotension control, as pt is very symptomatic.

## 2017-05-30 NOTE — Progress Notes (Signed)
*  PRELIMINARY RESULTS* Echocardiogram 2D Echocardiogram has been performed. Definity IV Contrast used on this study.  Keith Woods Keith Woods 05/30/2017, 5:28 PM

## 2017-05-30 NOTE — Plan of Care (Signed)
Problem: Safety: Goal: Ability to remain free from injury will improve Outcome: Progressing Patient remains free from injury, educated to call for assistance when/if out of bed. Hourly rounding completed on patient. Non skid socks on.   Problem: Physical Regulation: Goal: Ability to maintain clinical measurements within normal limits will improve Outcome: Not Progressing Patient orthostatic positive, confused at times and weak with ambulation.

## 2017-05-31 MED ORDER — MIDODRINE HCL 10 MG PO TABS: 10.0000 mg | ORAL_TABLET | Freq: Three times a day (TID) | ORAL | 1 refills | Status: DC

## 2017-05-31 MED ORDER — MIDODRINE HCL 5 MG PO TABS
10.0000 mg | ORAL_TABLET | Freq: Three times a day (TID) | ORAL | Status: DC
  Administered 2017-05-31: 10 mg via ORAL
  Filled 2017-05-31: qty 2

## 2017-05-31 NOTE — Progress Notes (Signed)
Progress Note  Patient Name: Keith Woods Date of Encounter: 05/31/2017  Primary Cardiologist: Dr. Saunders Revel Pimary Attendng: Gladstone Lighter, MD   Patient Profile     83 y.o. male with a history of known chronic combined systolic and diastolic heart failure (EF ~25-35%: presumably mixed ischemic and nonischemic from possible CAD - no recent ischemic evaluation, but large infarct noted on Myoview 03/2016, known LBBB), long-standing Parkinson's disease with orthostatic hypotension and multiple other comorbid morbidities including CK D-3, prior CVA, HLD .  He has had recurrent syncope and multiple falls. He presented on October 19 with an episode of syncope and collapse resulting in falling and hitting his left eye (large ecchymosis/hematoma left eyebrow from contusion). Apparently this occurred following urination, and prior to getting into his shower.  When found by EMS, he was pale and diaphoretic.He denied any chest tightness pressure or palpitations leading up to this episode. In fact he denied any sensation of rapid irregular heartbeats or palpitations either. He simply "blacked out". This is the first syncopal episode and I will see year, but he has had multiple falls due to dizziness. Dr. Saunders Revel has stopped his beta blocker because of fatigue and dizziness the past.  Because of mild troponin elevation, he was started on IV heparin -- stopped.  Principal Problem:   Syncope Active Problems:   Orthostatic hypotension   Chronic combined systolic and diastolic heart failure (HCC)   Dilated cardiomyopathy (HCC) - Presumed to be Ischemic; EF 25-30%   Labile hypertension   LBBB (left bundle branch block)   Parkinson's disease ([place])   Subjective   Anticipated discharge yesterday was stopped because he had more notable hypotension and some confusion.  Family was concerned therefore he stayed overnight.  Today he is noted again to be orthostatic.  Less confused, just sleepy. Has yet  to work with PT. Family would just like to take him home so he can sleep.  Inpatient Medications    Scheduled Meds: . acidophilus  1 capsule Oral Daily  . allopurinol  200 mg Oral Daily  . carbidopa-levodopa  1 tablet Oral TID  . citalopram  10 mg Oral BID  . midodrine  10 mg Oral TID WC  . sodium bicarbonate  650 mg Oral BID   Continuous Infusions:  PRN Meds: acetaminophen, nitroGLYCERIN, ondansetron (ZOFRAN) IV   Vital Signs    Vitals:   05/30/17 2145 05/30/17 2147 05/31/17 0414 05/31/17 0846  BP: 123/64 99/60 128/61 122/68  Pulse: 68 68 69 75  Resp:   18   Temp:   98.6 F (37 C)   TempSrc:   Oral   SpO2: 94% 95% 99% 94%  Weight:      Height:        Intake/Output Summary (Last 24 hours) at 05/31/17 1217 Last data filed at 05/31/17 0200  Gross per 24 hour  Intake              480 ml  Output              100 ml  Net              380 ml   Filed Weights   05/29/17 1220  Weight: 171 lb (77.6 kg)    Telemetry    Sinus rhythm in 60s and 70s.- Personally Reviewed  ECG    No new EKG - Personally Reviewed  Physical Exam   GEN: No acute distress.  Resting in bed. HEENT/Neck:  Stable ecchymosis  on the left eye otherwise normal Cardiac: RRR,(distant S1 and S2) no murmurs, rubs, or gallops.  Respiratory: Clear to auscultation bilaterally. Nonlabored, good air movement; no W/R/R Psych: pleasant mood and affect   Labs    Chemistry  Recent Labs Lab 05/29/17 1024  NA 140  K 4.1  CL 103  CO2 24  GLUCOSE 156*  BUN 40*  CREATININE 1.95*  CALCIUM 9.7  PROT 7.8  ALBUMIN 3.7  AST 31  ALT <5*  ALKPHOS 97  BILITOT 0.6  GFRNONAA 29*  GFRAA 33*  ANIONGAP 13     Hematology  Recent Labs Lab 05/29/17 1024 05/30/17 0437  WBC 8.5 8.1  RBC 4.28* 4.09*  HGB 13.5 13.1  HCT 40.7 38.7*  MCV 94.9 94.5  MCH 31.5 32.0  MCHC 33.2 33.8  RDW 15.3* 15.4*  PLT 246 263    Cardiac Enzymes  Recent Labs Lab 05/29/17 1024 05/29/17 1432 05/29/17 2042    TROPONINI 0.10* 0.08* 0.08*   No results for input(s): TROPIPOC in the last 168 hours.   BNPNo results for input(s): BNP, PROBNP in the last 168 hours.   DDimer No results for input(s): DDIMER in the last 168 hours.   Radiology    Ct Head Wo Contrast  Result Date: 05/30/2017 CLINICAL DATA:  Altered level of consciousness. EXAM: CT HEAD WITHOUT CONTRAST TECHNIQUE: Contiguous axial images were obtained from the base of the skull through the vertex without intravenous contrast. COMPARISON:  CT head 05/29/2017 FINDINGS: Brain: Moderate atrophy. Extensive chronic microvascular ischemic change in the white matter and thalamus bilaterally. Negative for acute infarct. Negative for hemorrhage or mass. No shift of midline structures Vascular: Negative for hyperdense vessel Skull: Negative Sinuses/Orbits: Negative Other: Left supraorbital scalp hematoma. IMPRESSION: Atrophy and extensive chronic microvascular ischemia. No acute abnormality and no change from yesterday. Electronically Signed   By: Franchot Gallo M.D.   On: 05/30/2017 17:42   US Carotid Bilateral  Result Date: 05/30/2017 CLINICAL DATA:  Syncope EXAM: BILATERAL CAROTID DUPLEX ULTRASOUND TECHNIQUE: Pearline Cables scale imaging, color Doppler and duplex ultrasound were performed of bilateral carotid and vertebral arteries in the neck. COMPARISON:  03/08/2016 FINDINGS: Criteria: Quantification of carotid stenosis is based on velocity parameters that correlate the residual internal carotid diameter with NASCET-based stenosis levels, using the diameter of the distal internal carotid lumen as the denominator for stenosis measurement. The following velocity measurements were obtained: RIGHT ICA:  53/19 cm/sec CCA:  89/38 cm/sec SYSTOLIC ICA/CCA RATIO:  0.8 DIASTOLIC ICA/CCA RATIO:  1.9 ECA:  90 cm/sec LEFT ICA:  49/17 cm/sec CCA:  10/17 cm/sec SYSTOLIC ICA/CCA RATIO:  0.9 DIASTOLIC ICA/CCA RATIO:  1.4 ECA:  74 cm/sec RIGHT CAROTID ARTERY: Minor atherosclerotic  plaque formation. No hemodynamically significant right ICA stenosis, velocity elevation, or turbulent flow. Degree of narrowing less than 50%. RIGHT VERTEBRAL ARTERY:  Antegrade LEFT CAROTID ARTERY: Similar scattered minor atherosclerotic plaque formation. No hemodynamically significant left ICA stenosis, velocity elevation, or turbulent flow. LEFT VERTEBRAL ARTERY:  Antegrade IMPRESSION: Very minor carotid atherosclerosis. No hemodynamically significant ICA stenosis. Degree of narrowing less than 50% bilaterally. Patent antegrade vertebral flow bilaterally Electronically Signed   By: Jerilynn Mages.  Shick M.D.   On: 05/30/2017 08:47    Cardiac Studies   Echocardiogram has been ordered, done but not read.  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    With persistent orthostasis, Midrin increased to 10 mg 3 times daily.  No antihypertensive agents necessary.  Would allow for permissive hypertension.  We will follow-up the results of the echocardiogram, but I do not expect this to change our treatment plan.  Would consider Northera as OP for nOH -   Disposition is unclear.  Family would like to take him home, however with persistent orthostasis, reluctant to allow for discharge.  I am concerned that he had some confusion last night and is starting to have signs of hospital delirium.  Perhaps discharge home will allow [place] to avoid any further delirium.  Consider having home health service evaluate his house for safety mechanisms such as holding rails for bathroom/toilet.  Patient necessary for mild troponin elevation. No anticoagulation.  He will need  follow-up with either Dr. Saunders Revel or Laurine Blazer   For questions or updates, please contact [place] Please consult [email or web address] for contact info under Cardiology/STEMI.      Signed, Glenetta Hew, MD  05/31/2017, 12:17 PM

## 2017-05-31 NOTE — Evaluation (Signed)
Physical Therapy Evaluation Patient Details Name: Keith Woods MRN: [id number] DOB: [birth year] Today's Date: 05/31/2017   History of Present Illness  83 yo male with onset of syncopal episode at home resulting in fall and striking L eyebrow. He was admitted with mult conditions contributing to low BP.  PMHx:  Parkinsons, CHF, CVA, CKD 3, HLD, multiple falls, LBBB,      Clinical Impression  Pt was up to side of bed after ck of vitals in bed:  Supine BP 135/75 with pulse 67 and O2 sat 99%; sitting BP 105/70, pulse 69 and O2 sat 96%; standing BP 78/53, pulse 85 and O2 sat 100%.  Family aware of values, and talked with nursing afterward with findings of cardiology that he would continue to have orthostasis.   Pt apparently has competing medical issues that limit the medication to intervene with low BP.  Family and MD will be in contact with one another to discuss the results, and PT will see acutely to try to increase activity within his limits of standing drops in BP.      Follow Up Recommendations SNF    Equipment Recommendations  None recommended by PT    Recommendations for Other Services       Precautions / Restrictions Precautions Precautions: Fall (orthostasis) Precaution Comments: ck BP in standing Restrictions Weight Bearing Restrictions: No      Mobility  Bed Mobility Overal bed mobility: Needs Assistance Bed Mobility: Supine to Sit     Supine to sit: Mod assist     General bed mobility comments: support to trunk and then used bed pad to finish sliding out  Transfers Overall transfer level: Needs assistance Equipment used: Rolling walker (2 wheeled) Transfers: Sit to/from Stand Sit to Stand: Min assist         General transfer comment: supported effort to power up  Ambulation/Gait             General Gait Details: deferred d/t BP  Stairs            Wheelchair Mobility    Modified Rankin (Stroke Patients Only)       Balance  Overall balance assessment: History of Falls;Needs assistance Sitting-balance support: Feet supported;Bilateral upper extremity supported Sitting balance-Leahy Scale: Fair     Standing balance support: Bilateral upper extremity supported;During functional activity Standing balance-Leahy Scale: Poor                               Pertinent Vitals/Pain Pain Assessment: No/denies pain    Home Living Family/patient expects to be discharged to:: Unsure Living Arrangements: Spouse/significant other               Additional Comments: has been falling frequently in the past per notes    Prior Function Level of Independence: Independent with assistive device(s)         Comments: has used SPC or walker with HHPT following him for balance     Hand Dominance   Dominant Hand: Right    Extremity/Trunk Assessment   Upper Extremity Assessment Upper Extremity Assessment: Generalized weakness    Lower Extremity Assessment Lower Extremity Assessment: Generalized weakness    Cervical / Trunk Assessment Cervical / Trunk Assessment: Kyphotic  Communication   Communication: No difficulties  Cognition Arousal/Alertness: Awake/alert;Lethargic Behavior During Therapy: Flat affect Overall Cognitive Status: Impaired/Different from baseline Area of Impairment: Safety/judgement;Awareness;Problem solving;Attention;Following commands;Memory  Current Attention Level: Selective Memory: Decreased recall of precautions;Decreased short-term memory Following Commands: Follows one step commands with increased time;Follows one step commands inconsistently Safety/Judgement: Decreased awareness of safety;Decreased awareness of deficits Awareness: Intellectual Problem Solving: Slow processing;Decreased initiation;Requires verbal cues        General Comments      Exercises     Assessment/Plan    PT Assessment Patient needs continued PT services  PT  Problem List Decreased range of motion;Decreased activity tolerance;Decreased balance;Decreased mobility;Decreased knowledge of use of DME;Decreased safety awareness;Cardiopulmonary status limiting activity       PT Treatment Interventions DME instruction;Gait training;Functional mobility training;Therapeutic activities;Therapeutic exercise;Balance training;Neuromuscular re-education;Patient/family education    PT Goals (Current goals can be found in the Care Plan section)  Acute Rehab PT Goals Patient Stated Goal: none stated PT Goal Formulation: With family Time For Goal Achievement: 06/14/17 Potential to Achieve Goals: Fair    Frequency Min 2X/week   Barriers to discharge Other (comment) (will be higher fall risk due to BP)      Co-evaluation               AM-PAC PT "6 Clicks" Daily Activity  Outcome Measure Difficulty turning over in bed (including adjusting bedclothes, sheets and blankets)?: Unable Difficulty moving from lying on back to sitting on the side of the bed? : Unable Difficulty sitting down on and standing up from a chair with arms (e.g., wheelchair, bedside commode, etc,.)?: Unable Help needed moving to and from a bed to chair (including a wheelchair)?: A Lot Help needed walking in hospital room?: Total Help needed climbing 3-5 steps with a railing? : Total 6 Click Score: 7    End of Session Equipment Utilized During Treatment: Gait belt Activity Tolerance: Treatment limited secondary to medical complications (Comment) Patient left: in bed;with call bell/phone within reach;with bed alarm set;with family/visitor present Nurse Communication: Mobility status PT Visit Diagnosis: Unsteadiness on feet (R26.81);Repeated falls (R29.6);Difficulty in walking, not elsewhere classified (R26.2);Apraxia (R48.2);Adult, failure to thrive (R62.7);Dizziness and giddiness (R42)    Time: 1610-9604 PT Time Calculation (min) (ACUTE ONLY): 28 min   Charges:   PT  Evaluation $PT Eval Moderate Complexity: 1 Mod PT Treatments $Therapeutic Activity: 8-22 mins   PT G Codes:   PT G-Codes **NOT FOR INPATIENT CLASS** Functional Assessment Tool Used: AM-PAC 6 Clicks Basic Mobility    Ramond Dial 05/31/2017, 4:30 PM   Mee Hives, PT MS Acute Rehab Dept. Number: [place] and [place]

## 2017-05-31 NOTE — Progress Notes (Signed)
[facility] [place] NAME: Keith Woods    MR#:  [id number]  DATE OF BIRTH:  [birth year]  SUBJECTIVE:  CHIEF COMPLAINT:   Chief Complaint  Patient presents with  . Fall   - still has marked positive orthostatics. Intermittent confusion noted last night- delirium - feels very fatigued this morning  REVIEW OF SYSTEMS:  Review of Systems  Constitutional: Positive for malaise/fatigue. Negative for chills and fever.  HENT: Negative for congestion, ear discharge, hearing loss and nosebleeds.   Eyes: Negative for blurred vision, double vision and photophobia.  Respiratory: Positive for shortness of breath. Negative for cough and wheezing.   Cardiovascular: Negative for chest pain, palpitations and leg swelling.  Gastrointestinal: Negative for abdominal pain, constipation, diarrhea, nausea and vomiting.  Genitourinary: Negative for dysuria.  Musculoskeletal: Negative for myalgias.  Neurological: Positive for dizziness. Negative for speech change, focal weakness, seizures and headaches.  Psychiatric/Behavioral: Negative for depression.       Confusion    DRUG ALLERGIES:   Allergies  Allergen Reactions  . Lipitor [Atorvastatin Calcium]     Leg pain  . Nsaids   . Statins     Leg pain    VITALS:  Blood pressure 122/68, pulse 75, temperature 98.6 F (37 C), temperature source Oral, resp. rate 18, height 6\' 2"  (1.88 m), weight 77.6 kg (171 lb), SpO2 94 %.  PHYSICAL EXAMINATION:  Physical Exam  GENERAL:  83 y.o.-year-old patient lying in the bed with no acute distress.  EYES: Pupils equal, round, reactive to light and accommodation. No scleral icterus. Extraocular muscles intact.  HEENT: Head atraumatic, normocephalic. Bruising noted around his left eye orbit. Oropharynx and nasopharynx clear.  NECK:  Supple, no jugular venous distention. No thyroid enlargement, no tenderness.  LUNGS: Normal breath sounds bilaterally, no wheezing,  rales,rhonchi or crepitation. No use of accessory muscles of respiration.  CARDIOVASCULAR: S1, S2 normal. No rubs, or gallops. 2/6 systolic murmur present ABDOMEN: Soft, nontender, nondistended. Bowel sounds present. No organomegaly or mass.  EXTREMITIES: No pedal edema, cyanosis, or clubbing.  NEUROLOGIC: Cranial nerves II through XII are intact. Muscle strength 5/5 in all extremities. Sensation intact. Gait not checked.  PSYCHIATRIC: The patient is alert and oriented x 2-3, very sleepy today but easily arousable.  SKIN: No obvious rash, lesion, or ulcer.    LABORATORY PANEL:   CBC  Recent Labs Lab 05/30/17 0437  WBC 8.1  HGB 13.1  HCT 38.7*  PLT 263   ------------------------------------------------------------------------------------------------------------------  Chemistries   Recent Labs Lab 05/29/17 1024  NA 140  K 4.1  CL 103  CO2 24  GLUCOSE 156*  BUN 40*  CREATININE 1.95*  CALCIUM 9.7  MG 1.8  AST 31  ALT <5*  ALKPHOS 97  BILITOT 0.6   ------------------------------------------------------------------------------------------------------------------  Cardiac Enzymes  Recent Labs Lab 05/29/17 2042  TROPONINI 0.08*   ------------------------------------------------------------------------------------------------------------------  RADIOLOGY:  Ct Head Wo Contrast  Result Date: 05/30/2017 CLINICAL DATA:  Altered level of consciousness. EXAM: CT HEAD WITHOUT CONTRAST TECHNIQUE: Contiguous axial images were obtained from the base of the skull through the vertex without intravenous contrast. COMPARISON:  CT head 05/29/2017 FINDINGS: Brain: Moderate atrophy. Extensive chronic microvascular ischemic change in the white matter and thalamus bilaterally. Negative for acute infarct. Negative for hemorrhage or mass. No shift of midline structures Vascular: Negative for hyperdense vessel Skull: Negative Sinuses/Orbits: Negative Other: Left supraorbital scalp  hematoma. IMPRESSION: Atrophy and extensive chronic microvascular ischemia. No acute abnormality and no  change from yesterday. Electronically Signed   By: Franchot Gallo M.D.   On: 05/30/2017 17:42   US Carotid Bilateral  Result Date: 05/30/2017 CLINICAL DATA:  Syncope EXAM: BILATERAL CAROTID DUPLEX ULTRASOUND TECHNIQUE: Pearline Cables scale imaging, color Doppler and duplex ultrasound were performed of bilateral carotid and vertebral arteries in the neck. COMPARISON:  03/08/2016 FINDINGS: Criteria: Quantification of carotid stenosis is based on velocity parameters that correlate the residual internal carotid diameter with NASCET-based stenosis levels, using the diameter of the distal internal carotid lumen as the denominator for stenosis measurement. The following velocity measurements were obtained: RIGHT ICA:  53/19 cm/sec CCA:  37/10 cm/sec SYSTOLIC ICA/CCA RATIO:  0.8 DIASTOLIC ICA/CCA RATIO:  1.9 ECA:  90 cm/sec LEFT ICA:  49/17 cm/sec CCA:  62/69 cm/sec SYSTOLIC ICA/CCA RATIO:  0.9 DIASTOLIC ICA/CCA RATIO:  1.4 ECA:  74 cm/sec RIGHT CAROTID ARTERY: Minor atherosclerotic plaque formation. No hemodynamically significant right ICA stenosis, velocity elevation, or turbulent flow. Degree of narrowing less than 50%. RIGHT VERTEBRAL ARTERY:  Antegrade LEFT CAROTID ARTERY: Similar scattered minor atherosclerotic plaque formation. No hemodynamically significant left ICA stenosis, velocity elevation, or turbulent flow. LEFT VERTEBRAL ARTERY:  Antegrade IMPRESSION: Very minor carotid atherosclerosis. No hemodynamically significant ICA stenosis. Degree of narrowing less than 50% bilaterally. Patent antegrade vertebral flow bilaterally Electronically Signed   By: Jerilynn Mages.  Shick M.D.   On: 05/30/2017 08:47    EKG:   Orders placed or performed during the hospital encounter of 05/29/17  . ED EKG  . ED EKG  . EKG 12-Lead  . EKG 12-Lead    ASSESSMENT AND PLAN:   83 year old male with past medical history significant for  known history of orthostatic hypertension, coronary artery disease, cardiomyopathy with EF 25%, Parkinson's disease, CK D stage III comes to the hospital secondary to a syncopal episode.  #1 syncope- neurocardiogenic syncope- with underlying parkinsons and positive orthostatic hypotension. - No arrhythmias noted on the monitor. -Appreciate cardiology consult. Troponin has plateaued, likely demand ischemia. -Outpatient 30 day monitor recommended -Ischemic workup as outpatient as needed - Echocardiogram is pending. -Carotid Dopplers with no hemodynamically significant stenosis. -CT of the head showing only chronic ischemic changes and atrophy. No acute findings.  #2 orthostatic hypotension-continue midodrine- increase the dose to 10mg  tid  #3 Parkinson's disease-on Sinemet  #4 Acute delirium in the hospital- CT of the head is negative, ABG is normal without any CO2 retention. -Urine analysis negative for any infection. Likely acute delirium in the hospital. Monitor closely, neurochecks -Reorient frequently. Improving today.  #5 DVT prophylaxis-Ted's and SCDs due to recent fall and head injury.  Physical therapy consult is pending.   All the records are reviewed and case discussed with Care Management/Social Workerr. Management plans discussed with the patient, family and they are in agreement.  CODE STATUS: Full Code  TOTAL TIME TAKING CARE OF THIS PATIENT: 37 minutes.   POSSIBLE D/C 1-2 days, DEPENDING ON CLINICAL CONDITION.   Gladstone Lighter M.D on 05/31/2017 at 11:24 AM  Between 7am to 6pm - Pager - [phone]  After 6pm go to [email or web address] - password [email or web address] [facility]  Office  [phone]  CC: Primary care physician; [name], MD

## 2017-05-31 NOTE — Care Management Note (Signed)
Case Management Note  Patient Details  Name: Keith Woods MRN: [id number] Date of Birth: [birth year]  Subjective/Objective:    Discussed discharge planning with daughter Jenny Reichmann. Jenny Reichmann requests that all calls regarding [name] come through her phone at [phone]. Cindy chose [facility] for HH=PT, RN because "[name] wonderful the last time we used them." A referral for HH=PT, RN was called to [place] [facility].                Action/Plan:   Expected Discharge Date:  05/31/17               Expected Discharge Plan:  [place]  In-House Referral:  NA  Discharge planning Services  CM Consult  Post Acute Care Choice:  Home Health Choice offered to:  Adult Children  DME Arranged:  N/A DME Agency:  NA  HH Arranged:  RN, PT HH Agency:  [facility] (Resumption of care)  Status of Service:  Completed, signed off  If discussed at Long Length of Stay Meetings, dates discussed:    Additional Comments:  Idalee Foxworthy A, RN 05/31/2017, 4:12 PM

## 2017-05-31 NOTE — Progress Notes (Signed)
Pt still orthostatic positive, MD and family members are aware. Pt states they are asymptomatic. IV and telemetry removed, pt education, discharge paperwork and prescription given to patient and pts family.

## 2017-06-01 ENCOUNTER — Telehealth: Payer: Self-pay | Admitting: Internal Medicine

## 2017-06-01 NOTE — Discharge Summary (Signed)
[facility] [place] NAME: Keith Woods    MR#:  [id number]  DATE OF BIRTH:  03-May-1926  DATE OF ADMISSION:  05/29/2017   ADMITTING PHYSICIAN: Demetrios Loll, MD  DATE OF DISCHARGE: 05/31/2017  4:52 PM  PRIMARY CARE PHYSICIAN: [name], MD   ADMISSION DIAGNOSIS:   Syncope and collapse [R55] Syncope [R55] Elevated troponin I level [R74.8]  DISCHARGE DIAGNOSIS:   Principal Problem:   Syncope Active Problems:   Labile hypertension   LBBB (left bundle branch block)   Parkinson's disease (HCC)   Orthostatic hypotension   Chronic combined systolic and diastolic heart failure (HCC)   Dilated cardiomyopathy ([place]) - Presumed to be Ischemic; EF 25-30%   SECONDARY DIAGNOSIS:   Past Medical History:  Diagnosis Date  . Coronary artery disease    NONOBSTRUCTIVE by cath 2008  . GERD (gastroesophageal reflux disease)   . History of prostate cancer   . History of transient ischemic attack (TIA)   . Hyperlipidemia   . Hypertension   . LBBB (left bundle branch block)   . Stroke ([place]) 02/2016   Hemorrhagic  . Syncope and collapse     HOSPITAL COURSE:   83 year old male with past medical history significant for known history of orthostatic hypertension, coronary artery disease, cardiomyopathy with EF 25%, Parkinson's disease, CK D stage III comes to the hospital secondary to a syncopal episode.  #1 syncope- neurocardiogenic syncope- with underlying parkinsons and positive orthostatic hypotension. - No arrhythmias noted on the monitor. -Appreciate cardiology consult. Troponin has plateaued, likely demand ischemia. -Outpatient 30 day monitor recommended -Ischemic workup as outpatient as needed - Echocardiogram is at baseline with EF of 20-25% and diffuse hypokinesis.. -Carotid Dopplers with no hemodynamically significant stenosis. -CT of the head showing only chronic ischemic changes and atrophy. No acute findings.  #2  orthostatic hypotension-continue midodrine- increased the dose to 10mg  tid Outpatient follow up recommended  #3 Parkinson's disease-on Sinemet  #4 Acute delirium in the hospital- CT of the head is negative, ABG is normal without any CO2 retention. -Urine analysis negative for any infection. Likely acute delirium in the hospital. Monitor closely, neurochecks -Reorient frequently. Improved prior to discharge  The pressure was still orthostatic prior to discharge, family however said that this is his baseline and they wanted to take him home with increased dose of midodrine and outpatient follow-up   Physical therapy consult recommended SNF- but family wanted to take him home, as patient seemed to be at his baseline. ordered home health  DISCHARGE CONDITIONS:   [place]  CONSULTS OBTAINED:   Treatment Team:  Wellington Hampshire, MD  DRUG ALLERGIES:   Allergies  Allergen Reactions  . Lipitor [Atorvastatin Calcium]     Leg pain  . Nsaids   . Statins     Leg pain   DISCHARGE MEDICATIONS:   Allergies as of 05/31/2017      Reactions   Lipitor [atorvastatin Calcium]    Leg pain   Nsaids    Statins    Leg pain      Medication List    STOP taking these medications   ezetimibe 10 MG tablet Commonly known as:  ZETIA     TAKE these medications   ALIGN 4 MG Caps Take 4 mg by mouth daily.   allopurinol 100 MG tablet Commonly known as:  ZYLOPRIM take 2 tablets by mouth once daily   aspirin 81 MG EC tablet Take 1 tablet (81 mg  total) by mouth daily.   b complex vitamins tablet Take 1 tablet by mouth daily.   carbidopa-levodopa 25-100 MG tablet Commonly known as:  SINEMET IR Take 1 tablet by mouth 3 (three) times daily.   citalopram 10 MG tablet Commonly known as:  CELEXA take 1 tablet by mouth twice a day   Colchicine 0.6 MG Caps Commonly known as:  MITIGARE Two tablets today, then 1 tablet daily   midodrine 10 MG tablet Commonly known as:   PROAMATINE Take 1 tablet (10 mg total) by mouth 3 (three) times daily with meals. What changed:  medication strength  how much to take   multivitamin tablet Take 1 tablet by mouth daily.   nitroGLYCERIN 0.4 MG SL tablet Commonly known as:  NITROSTAT Place 1 tablet (0.4 mg total) under the tongue every 5 (five) minutes as needed for chest pain.   sodium bicarbonate 650 MG tablet Take 650 mg by mouth 2 (two) times daily.   Vitamin D (Ergocalciferol) 50000 units Caps capsule Commonly known as:  DRISDOL TAKE 1 CAPSULE ONCE A WEEK        DISCHARGE INSTRUCTIONS:   1. PCP f/u in 1-2 weeks 2. Cardiology f/u in 2 weeks  DIET:   Cardiac diet  ACTIVITY:   Activity as tolerated  OXYGEN:   Home Oxygen: No.  Oxygen Delivery: room air  DISCHARGE LOCATION:   home   If you experience worsening of your admission symptoms, develop shortness of breath, life threatening emergency, suicidal or homicidal thoughts you must seek medical attention immediately by calling 911 or calling your MD immediately  if symptoms less severe.  You Must read complete instructions/literature along with all the possible adverse reactions/side effects for all the Medicines you take and that have been prescribed to you. Take any new Medicines after you have completely understood and accpet all the possible adverse reactions/side effects.   Please note  You were cared for by a hospitalist during your hospital stay. If you have any questions about your discharge medications or the care you received while you were in the hospital after you are discharged, you can call the unit and asked to speak with the hospitalist on call if the hospitalist that took care of you is not available. Once you are discharged, your primary care physician will handle any further medical issues. Please note that NO REFILLS for any discharge medications will be authorized once you are discharged, as it is imperative that you return  to your primary care physician (or establish a relationship with a primary care physician if you do not have one) for your aftercare needs so that they can reassess your need for medications and monitor your lab values.    On the day of Discharge:  VITAL SIGNS:   Blood pressure 122/68, pulse 75, temperature 98.6 F (37 C), temperature source Oral, resp. rate 18, height 6\' 2"  (1.88 m), weight 77.6 kg (171 lb), SpO2 94 %.  PHYSICAL EXAMINATION:    GENERAL:  83 y.o.-year-old patient lying in the bed with no acute distress.  EYES: Pupils equal, round, reactive to light and accommodation. No scleral icterus. Extraocular muscles intact.  HEENT: Head atraumatic, normocephalic. Bruising noted around his left eye orbit. Oropharynx and nasopharynx clear.  NECK:  Supple, no jugular venous distention. No thyroid enlargement, no tenderness.  LUNGS: Normal breath sounds bilaterally, no wheezing, rales,rhonchi or crepitation. No use of accessory muscles of respiration.  CARDIOVASCULAR: S1, S2 normal. No rubs, or gallops. 2/6 systolic  murmur present ABDOMEN: Soft, nontender, nondistended. Bowel sounds present. No organomegaly or mass.  EXTREMITIES: No pedal edema, cyanosis, or clubbing.  NEUROLOGIC: Cranial nerves II through XII are intact. Muscle strength 5/5 in all extremities. Sensation intact. Gait not checked.  PSYCHIATRIC: The patient is alert and oriented x 3,  SKIN: No obvious rash, lesion, or ulcer.   DATA REVIEW:   CBC  Recent Labs Lab 05/30/17 0437  WBC 8.1  HGB 13.1  HCT 38.7*  PLT 263    Chemistries   Recent Labs Lab 05/29/17 1024  NA 140  K 4.1  CL 103  CO2 24  GLUCOSE 156*  BUN 40*  CREATININE 1.95*  CALCIUM 9.7  MG 1.8  AST 31  ALT <5*  ALKPHOS 97  BILITOT 0.6     Microbiology Results  Results for orders placed or performed during the hospital encounter of 10/10/08  Urine culture     Status: None   Collection Time: 10/09/08  6:50 PM  Result Value Ref  Range Status   Specimen Description URINE, RANDOM  Final   Special Requests GX:QJJHE ON [id number] @0227   Final   Colony Count NO GROWTH  Final   Culture NO GROWTH  Final   Report Status 10/11/2008 FINAL  Final  Culture, blood (routine x 2)     Status: None   Collection Time: 10/10/08  2:35 AM  Result Value Ref Range Status   Specimen Description BLOOD RIGHT HAND  Final   Special Requests BOTTLES DRAWN AEROBIC AND ANAEROBIC 10CC EACH  Final   Culture NO GROWTH 5 DAYS  Final   Report Status 10/16/2008 FINAL  Final  Culture, blood (routine x 2)     Status: None   Collection Time: 10/10/08  2:45 AM  Result Value Ref Range Status   Specimen Description BLOOD RIGHT HAND  Final   Special Requests BOTTLES DRAWN AEROBIC AND ANAEROBIC 5CC  Final   Culture NO GROWTH 5 DAYS  Final   Report Status 10/16/2008 FINAL  Final  Clostridium difficile EIA     Status: None   Collection Time: 10/10/08  4:06 PM  Result Value Ref Range Status   Specimen Description STOOL  Final   Special Requests IMMUNE:NORM  Final   C difficile Toxins A+B, EIA NEGATIVE  Final   Report Status 10/11/2008 FINAL  Final  Fecal lactoferrin     Status: None   Collection Time: 10/10/08  4:06 PM  Result Value Ref Range Status   Specimen Description STOOL  Final   Special Requests NONE  Final   Fecal Lactoferrin POSITIVE  Final   Report Status 10/11/2008 FINAL  Final  Stool culture     Status: None   Collection Time: 10/10/08  4:06 PM  Result Value Ref Range Status   Specimen Description STOOL  Final   Special Requests IMMUNE:NORM  Final   Culture   Final    NO SALMONELLA, SHIGELLA, CAMPYLOBACTER, OR YERSINIA ISOLATED   Report Status 10/14/2008 FINAL  Final  Stool culture     Status: None   Collection Time: 10/12/08  6:47 PM  Result Value Ref Range Status   Specimen Description STOOL  Final   Special Requests NONE  Final   Culture   Final    NO SALMONELLA, SHIGELLA, CAMPYLOBACTER, OR YERSINIA ISOLATED   Report Status  10/16/2008 FINAL  Final    RADIOLOGY:  No results found.   Management plans discussed with the patient, family and they are in agreement.  CODE STATUS:  Code Status History    Date Active Date Inactive Code Status Order ID Comments User Context   05/29/2017  2:25 PM 05/31/2017  7:57 PM Full Code [id number]  Demetrios Loll, MD Inpatient   06/24/2016  3:54 PM 06/28/2016  3:41 PM Full Code [id number]  Nicholes Mango, MD Inpatient   06/24/2016  3:36 AM 06/24/2016  3:54 PM DNR [id number]  Saundra Shelling, MD Inpatient   03/10/2016  8:37 AM 03/10/2016  2:45 PM DNR [id number]  [name], MD Inpatient   03/08/2016  4:02 PM 03/08/2016  4:21 PM Full Code [id number]  Nicholes Mango, MD Inpatient    Advance Directive Documentation     Most Recent Value  Type of Advance Directive  Healthcare Power of Attorney  Pre-existing out of facility DNR order (yellow form or pink MOST form)  -  "MOST" Form in Place?  -      TOTAL TIME TAKING CARE OF THIS PATIENT: 37 minutes.    Gladstone Lighter M.D on 06/01/2017 at 2:08 PM  Between 7am to 6pm - Pager - [phone]  After 6pm go to [email or web address] - Proofreader  [facility]  Office  [phone]  CC: Primary care physician; [name], MD   Note: This dictation was prepared with Dragon dictation along with smaller phrase technology. Any transcriptional errors that result from this process are unintentional.

## 2017-06-01 NOTE — Telephone Encounter (Signed)
Lmov for patient to call and scheduled sooner appt They were seen in hospital 05/31/17

## 2017-06-02 DIAGNOSIS — G20 Parkinson's disease: Secondary | ICD-10-CM | POA: Diagnosis not present

## 2017-06-02 DIAGNOSIS — R55 Syncope and collapse: Secondary | ICD-10-CM | POA: Diagnosis not present

## 2017-06-03 DIAGNOSIS — G20 Parkinson's disease: Secondary | ICD-10-CM | POA: Diagnosis not present

## 2017-06-03 DIAGNOSIS — R55 Syncope and collapse: Secondary | ICD-10-CM | POA: Diagnosis not present

## 2017-06-03 NOTE — Telephone Encounter (Signed)
Patient contacted regarding discharge from [facility] on 05/31/17.   Patient understands to follow up with provider ? On 07/07/17 at 2:20 pm at [facility].  Patient understands discharge instructions? Yes  Patient understands medications and regiment? Yes   Patient understands to bring all medications to this visit? Yes

## 2017-06-03 NOTE — Telephone Encounter (Signed)
TCM ph [facility] for Syncope and Collapse  Scheduled 11/27 with end needs 7-10 days   Added to wait list

## 2017-06-05 ENCOUNTER — Telehealth: Payer: Self-pay | Admitting: Family Medicine

## 2017-06-05 DIAGNOSIS — G20 Parkinson's disease: Secondary | ICD-10-CM | POA: Diagnosis not present

## 2017-06-05 DIAGNOSIS — R55 Syncope and collapse: Secondary | ICD-10-CM | POA: Diagnosis not present

## 2017-06-05 NOTE — Telephone Encounter (Signed)
The 06-18-17 appointment is fine.

## 2017-06-05 NOTE — Telephone Encounter (Signed)
Pt scheduled a hospital f/u himself via [place] in a [place] office visit slot on 06/18/17 @ 10:45 am. Is this appt ok or should we contact pt to reschedule? Please advise. Thanks TNP

## 2017-06-05 NOTE — Telephone Encounter (Signed)
Please advise 

## 2017-06-09 ENCOUNTER — Telehealth: Payer: Self-pay | Admitting: Family Medicine

## 2017-06-09 ENCOUNTER — Ambulatory Visit (INDEPENDENT_AMBULATORY_CARE_PROVIDER_SITE_OTHER): Payer: Medicare Other | Admitting: Internal Medicine

## 2017-06-09 ENCOUNTER — Encounter: Payer: Self-pay | Admitting: Internal Medicine

## 2017-06-09 VITALS — BP 123/80 | HR 69 | Ht 73.0 in | Wt 174.0 lb

## 2017-06-09 DIAGNOSIS — I5042 Chronic combined systolic (congestive) and diastolic (congestive) heart failure: Secondary | ICD-10-CM

## 2017-06-09 DIAGNOSIS — R55 Syncope and collapse: Secondary | ICD-10-CM

## 2017-06-09 DIAGNOSIS — I2510 Atherosclerotic heart disease of native coronary artery without angina pectoris: Secondary | ICD-10-CM | POA: Diagnosis not present

## 2017-06-09 DIAGNOSIS — G20 Parkinson's disease: Secondary | ICD-10-CM | POA: Diagnosis not present

## 2017-06-09 DIAGNOSIS — E782 Mixed hyperlipidemia: Secondary | ICD-10-CM | POA: Diagnosis not present

## 2017-06-09 DIAGNOSIS — I951 Orthostatic hypotension: Secondary | ICD-10-CM | POA: Diagnosis not present

## 2017-06-09 MED ORDER — NITROGLYCERIN 0.4 MG SL SUBL: 0.4000 mg | SUBLINGUAL_TABLET | SUBLINGUAL | 99 refills | Status: DC | PRN

## 2017-06-09 NOTE — Telephone Encounter (Signed)
Pts daughter requesting a week supply sent to [facility] on [address] and send a [facility] day supply to [facility].  Pt's daughter states pt is out of medication tomorrow and mail order will not have enough time to get meds there before the patient runs out.

## 2017-06-09 NOTE — Progress Notes (Signed)
Follow-up Outpatient Visit Date: 06/09/2017  Primary Care Provider: [name], MD [address] [place] [address]  Chief Complaint: Follow-up syncope and recent hospitalization  HPI:  Keith Woods is a 83 y.o. year-old male with history of chronic systolic heart failure with severely reduced LVEF (presumed ischemic based on abnormal myocardial perfusion stress test in 03/2016), stroke, hypertension, hyperlipidemia, recurrent syncope with orthostatic hypotension, chronic kidney disease stage III, and Parkinson symptoms, who presents for follow-up of orthostatic hypotension and recent hospitalization.  I last saw Keith Woods in August, at which time he was relatively stable with occasional lightheadedness.  He was admitted on 05/29/17 after a syncopal episode.  Echocardiogram was unchanged with an LVEF of 20-25%.  Midodrine was increased to 10 mg 3 times daily.  Rosuvastatin was stopped by his PCP (Dr. Caryn Section) after our last visit due to myalgias.  He is currently on ezetimibe, per Dr. Caryn Section.  Today, Keith Woods reports that he has been feeling relatively well.  He has not had any further episodes of lightheadedness or syncope since being in the hospital.  He notes that on the day that he fell, his nursing aide was helping him to the bathroom.  He does not recall the exact events but apparently fell to the ground and struck his face.  Bruises are still evident and gradually improving.  He has not had any chest pain, shortness of breath, palpitations, or edema.  He is trying to stay well-hydrated and wears compression stockings on a regular basis.  He monitors his blood pressure at home but does not recall any specific readings.  He does not recall any significantly elevated or low blood pressures.  --------------------------------------------------------------------------------------------------  Cardiovascular History & Procedures: Cardiovascular Problems:  Severe systolic  heart failure (presumed ischemic)  Stroke  Orthostatic hypotension  Risk Factors:  Known CAD, hypertension, hyperlipidemia, age greater than 2, and male gender  Cath/PCI:  LHC (08/28/06): LMCA normal. LAD with mild luminal irregularities. Moderate sized ramus with mild luminal irregularities. Codominant LCx with mild luminal irregularities. Small moderate sized codominant RCA with mild luminal irregularities. LVEF 60%.  CV Surgery:  None  EP Procedures and Devices:  None  Non-Invasive Evaluation(s):  Transthoracic echocardiogram (03/08/16): Normal LV size with severely reduced contraction (EF 25-30%) with diffuse hypokinesis. Grade 1 diastolic dysfunction. Mild MR. Normal RV size and function. Trivial TR.  Pharmacologic myocardial perfusion stress test (04/10/16): Large, severe inferior and apical defect consistent with prior infarction. No evidence of ischemia. LVEF 26%.  Limited echo (06/24/16): Normal LV size and wall thickness with LVEF of 25-30%. Mild aortic regurgitation. Normal RV size and function.  Recent CV Pertinent Labs: Lab Results  Component Value Date   CHOL 235 (H) 01/28/2017   HDL 35 (L) 01/28/2017   LDLCALC 139 (H) 01/28/2017   TRIG 306 (H) 01/28/2017   CHOLHDL 6.7 (H) 01/28/2017   CHOLHDL 5.2 03/09/2016   INR 0.94 05/29/2017   K 4.1 05/29/2017   MG 1.8 05/29/2017   BUN 40 (H) 05/29/2017   BUN 26 02/05/2016   CREATININE 1.95 (H) 05/29/2017    Past medical and surgical history were reviewed and updated in EPIC.  Current Meds  Medication Sig  . allopurinol (ZYLOPRIM) 100 MG tablet take 2 tablets by mouth once daily  . aspirin EC 81 MG EC tablet Take 1 tablet (81 mg total) by mouth daily.  Marland Kitchen b complex vitamins tablet Take 1 tablet by mouth daily.  . carbidopa-levodopa (SINEMET IR) 25-100  MG tablet Take 1 tablet by mouth 3 (three) times daily.  . citalopram (CELEXA) 10 MG tablet take 1 tablet by mouth twice a day  . Colchicine (MITIGARE) 0.6 MG  CAPS Two tablets today, then 1 tablet daily  . midodrine (PROAMATINE) 10 MG tablet Take 1 tablet (10 mg total) by mouth 3 (three) times daily with meals.  . Multiple Vitamin (MULTIVITAMIN) tablet Take 1 tablet by mouth daily.    . nitroGLYCERIN (NITROSTAT) 0.4 MG SL tablet Place 1 tablet (0.4 mg total) under the tongue every 5 (five) minutes as needed for chest pain.  . Probiotic Product (ALIGN) 4 MG CAPS Take 4 mg by mouth daily.  . sodium bicarbonate 650 MG tablet Take 650 mg by mouth 2 (two) times daily.  . Vitamin D, Ergocalciferol, (DRISDOL) 50000 units CAPS capsule TAKE 1 CAPSULE ONCE A WEEK    Allergies: Lipitor [atorvastatin calcium]; Nsaids; and Statins  Social History   Social History  . Marital status: Married    Spouse name: N/A  . Number of children: 3  . Years of education: N/A   Occupational History  . minister-retired    Social History Main Topics  . Smoking status: Former Research scientist (life sciences)  . Smokeless tobacco: Never Used  . Alcohol use No  . Drug use: No  . Sexual activity: Not on file   Other Topics Concern  . Not on file   Social History Narrative  . No narrative on file    Family History  Problem Relation Age of Onset  . Ovarian cancer Mother   . Tuberculosis Mother   . Prostate cancer Father     Review of Systems: A 12-system review of systems was performed and was negative except as noted in the HPI.  --------------------------------------------------------------------------------------------------  Physical Exam: BP 123/80 (BP Location: Right Arm, Patient Position: Sitting, Cuff Size: Large)   Pulse 69   Ht 6\' 1"  (1.854 m)   Wt 174 lb (78.9 kg)   BMI 22.96 kg/m   General: Frail, elderly man, seated in the exam room with his daughter. HEENT: No conjunctival pallor or scleral icterus. Moist mucous membranes.  OP clear. Neck: Supple without lymphadenopathy, thyromegaly, JVD, or HJR. Lungs: Normal work of breathing. Clear to auscultation bilaterally  without wheezes or crackles. Heart: Regular rate and rhythm without murmurs, rubs, or gallops. Non-displaced PMI. Abd: Bowel sounds present. Soft, NT/ND without hepatosplenomegaly Ext: No lower extremity edema with compression stockings in place. Skin: Warm and dry without rash.  EKG: Normal sinus rhythm with left bundle branch block and left axis deviation.  Lab Results  Component Value Date   WBC 8.1 05/30/2017   HGB 13.1 05/30/2017   HCT 38.7 (L) 05/30/2017   MCV 94.5 05/30/2017   PLT 263 05/30/2017    Lab Results  Component Value Date   NA 140 05/29/2017   K 4.1 05/29/2017   CL 103 05/29/2017   CO2 24 05/29/2017   BUN 40 (H) 05/29/2017   CREATININE 1.95 (H) 05/29/2017   GLUCOSE 156 (H) 05/29/2017   ALT <5 (L) 05/29/2017    Lab Results  Component Value Date   CHOL 235 (H) 01/28/2017   HDL 35 (L) 01/28/2017   LDLCALC 139 (H) 01/28/2017   TRIG 306 (H) 01/28/2017   CHOLHDL 6.7 (H) 01/28/2017    --------------------------------------------------------------------------------------------------  ASSESSMENT AND PLAN: Syncope and orthostatic hypotension This is been a recurrent problem for Keith Woods.  Fortunately, he has not had any further falls or syncopal episodes since  his hospitalization earlier in the month.  He seems to be tolerating midodrine 10 mg 3 times daily well.  We have agreed to continue his current medications.  I am hesitant to escalate midodrine further or add fludrocortisone or droxidopa at this time in the setting of his labile blood pressure, chronic systolic heart failure, suspected coronary artery disease, and Parkinson's symptoms.  Chronic systolic and diastolic heart failure Keith Woods appears euvolemic today.  I encouraged him to continue to stay well-hydrated but to avoid salt.  He has been intolerant of all evidence-based heart failure therapy due to his hypotension and chronic kidney disease.  No further intervention at this time.  Coronary  artery disease No symptoms to suggest worsening coronary insufficiency.  No medication changes today.  Hyperlipidemia Most recent LDL in July was139 .  Atorvastatin was discontinued this summer by Dr. Caryn Section due to myalgias.  It is reasonable to continue with ezetimibe.  Follow-up: Return to clinic in 4 months.  Nelva Bush, MD 06/09/2017 3:05 PM

## 2017-06-09 NOTE — Telephone Encounter (Signed)
Pt's daughter called back stated that they found a bottle of allopurinol (ZYLOPRIM) 100 MG tablet and no longer need a refill sent to either pharmacy. Thanks TNP

## 2017-06-09 NOTE — Patient Instructions (Signed)
Medication Instructions:  Your physician recommends that you continue on your current medications as directed. Please refer to the Current Medication list given to you today.   Labwork: none  Testing/Procedures: none  Follow-Up: Your physician recommends that you schedule a follow-up appointment in: 4 MONTHS WITH DR END.   If you need a refill on your cardiac medications before your next appointment, please call your pharmacy.

## 2017-06-09 NOTE — Telephone Encounter (Signed)
Noted  

## 2017-06-09 NOTE — Telephone Encounter (Signed)
Left message asking which medication/s needs to be sent to pharmacy.

## 2017-06-11 DIAGNOSIS — G20 Parkinson's disease: Secondary | ICD-10-CM | POA: Diagnosis not present

## 2017-06-11 DIAGNOSIS — R55 Syncope and collapse: Secondary | ICD-10-CM | POA: Diagnosis not present

## 2017-06-11 NOTE — Addendum Note (Signed)
Addended by: Britt Bottom on: 06/11/2017 11:32 AM   Modules accepted: Orders

## 2017-06-12 DIAGNOSIS — G20 Parkinson's disease: Secondary | ICD-10-CM | POA: Diagnosis not present

## 2017-06-12 DIAGNOSIS — R55 Syncope and collapse: Secondary | ICD-10-CM | POA: Diagnosis not present

## 2017-06-17 ENCOUNTER — Other Ambulatory Visit: Payer: Self-pay | Admitting: Family Medicine

## 2017-06-17 DIAGNOSIS — G20 Parkinson's disease: Secondary | ICD-10-CM | POA: Diagnosis not present

## 2017-06-17 DIAGNOSIS — M109 Gout, unspecified: Secondary | ICD-10-CM

## 2017-06-17 DIAGNOSIS — R55 Syncope and collapse: Secondary | ICD-10-CM | POA: Diagnosis not present

## 2017-06-17 NOTE — Progress Notes (Signed)
Patient: Keith Woods Basic Male    DOB: [birth year]   83 y.o.   MRN: [id number] Visit Date: 06/18/2017  Today's Provider: [name], MD   Chief Complaint  Patient presents with  . Hospitalization Follow-up   Subjective:    HPI   Follow up Hospitalization  Patient was admitted to [facility] on 05/29/2017 and discharged on 05/31/2017. He was treated for Syncope. Treatment for this included; CT Head, labs, echo cardiogram. Telephone follow up was done on 06/05/2017 He reports good compliance with treatment. He reports this condition is Improved. He is no on midodrine for orthostatic hypotension and has had follow up with Dr. Saunders Revel since discharge. He has had no falls since discharge and having much fewer dizzy spells.  ----------------------------------------------------------------    Allergies  Allergen Reactions  . Lipitor [Atorvastatin Calcium]     Leg pain  . Nsaids   . Statins     Leg pain     Current Outpatient Medications:  .  allopurinol (ZYLOPRIM) 100 MG tablet, TAKE 2 TABLETS DAILY, Disp: 180 tablet, Rfl: 4 .  aspirin EC 81 MG EC tablet, Take 1 tablet (81 mg total) by mouth daily., Disp: 30 tablet, Rfl: 0 .  b complex vitamins tablet, Take 1 tablet by mouth daily., Disp: , Rfl:  .  carbidopa-levodopa (SINEMET IR) 25-100 MG tablet, Take 1 tablet by mouth 3 (three) times daily., Disp: , Rfl:  .  citalopram (CELEXA) 10 MG tablet, take 1 tablet by mouth twice a day, Disp: 60 tablet, Rfl: 12 .  Colchicine (MITIGARE) 0.6 MG CAPS, Two tablets today, then 1 tablet daily, Disp: 30 capsule, Rfl: 3 .  midodrine (PROAMATINE) 10 MG tablet, Take 1 tablet (10 mg total) by mouth 3 (three) times daily with meals., Disp: 90 tablet, Rfl: 1 .  Multiple Vitamin (MULTIVITAMIN) tablet, Take 1 tablet by mouth daily.  , Disp: , Rfl:  .  nitroGLYCERIN (NITROSTAT) 0.4 MG SL tablet, Place 1 tablet (0.4 mg total) under the tongue every 5 (five) minutes as needed for chest pain.,  Disp: 25 tablet, Rfl: prn .  Probiotic Product (ALIGN) 4 MG CAPS, Take 4 mg by mouth daily., Disp: , Rfl:  .  sodium bicarbonate 650 MG tablet, Take 650 mg by mouth 2 (two) times daily., Disp: , Rfl: 0 .  Vitamin D, Ergocalciferol, (DRISDOL) 50000 units CAPS capsule, TAKE 1 CAPSULE ONCE A WEEK, Disp: 12 capsule, Rfl: 4  Review of Systems  Constitutional: Negative for appetite change, chills and fever.  Respiratory: Negative for chest tightness, shortness of breath and wheezing.   Cardiovascular: Negative for chest pain and palpitations.  Gastrointestinal: Negative for abdominal pain, nausea and vomiting.    Social History   Tobacco Use  . Smoking status: Former Research scientist (life sciences)  . Smokeless tobacco: Never Used  Substance Use Topics  . Alcohol use: No   Objective:   BP 130/74 (BP Location: Right Arm, Patient Position: Sitting, Cuff Size: Normal)   Pulse 83   Temp (!) 97.5 F (36.4 C) (Oral)   Resp 16   Ht 6\' 1"  (1.854 m)   Wt 176 lb (79.8 kg)   SpO2 96%   BMI 23.22 kg/m  Vitals:   06/18/17 1108  BP: 130/74  Pulse: 83  Resp: 16  Temp: (!) 97.5 F (36.4 C)  TempSrc: Oral  SpO2: 96%  Weight: 176 lb (79.8 kg)  Height: 6\' 1"  (1.854 m)     Physical Exam   General  Appearance:    Alert, cooperative, no distress  Eyes:    PERRL, conjunctiva/corneas clear, EOM's intact       Lungs:     Clear to auscultation bilaterally, respirations unlabored  Heart:    Regular rate and rhythm  Neurologic:   Awake, alert, oriented x 3. No apparent focal neurological           defect.           Assessment & Plan:     1. Orthostatic hypotension Doing better on midodrine. Continue current medications.         [name], MD  [facility]

## 2017-06-18 ENCOUNTER — Telehealth: Payer: Self-pay

## 2017-06-18 ENCOUNTER — Encounter: Payer: Self-pay | Admitting: Family Medicine

## 2017-06-18 ENCOUNTER — Ambulatory Visit (INDEPENDENT_AMBULATORY_CARE_PROVIDER_SITE_OTHER): Payer: Medicare Other | Admitting: Family Medicine

## 2017-06-18 VITALS — BP 130/74 | HR 83 | Temp 97.5°F | Resp 16 | Ht 73.0 in | Wt 176.0 lb

## 2017-06-18 DIAGNOSIS — I255 Ischemic cardiomyopathy: Secondary | ICD-10-CM | POA: Diagnosis not present

## 2017-06-18 DIAGNOSIS — I951 Orthostatic hypotension: Secondary | ICD-10-CM | POA: Diagnosis not present

## 2017-06-18 DIAGNOSIS — R55 Syncope and collapse: Secondary | ICD-10-CM | POA: Diagnosis not present

## 2017-06-18 DIAGNOSIS — G20 Parkinson's disease: Secondary | ICD-10-CM | POA: Diagnosis not present

## 2017-06-18 NOTE — Telephone Encounter (Signed)
Denyse Amass, a nurse from [facility] called to let Dr. Caryn Section know that the patient was discharged from Home health today. He has met all of his goals. Just FYI.

## 2017-06-19 DIAGNOSIS — R55 Syncope and collapse: Secondary | ICD-10-CM | POA: Diagnosis not present

## 2017-06-19 DIAGNOSIS — G20 Parkinson's disease: Secondary | ICD-10-CM | POA: Diagnosis not present

## 2017-06-23 ENCOUNTER — Encounter (INDEPENDENT_AMBULATORY_CARE_PROVIDER_SITE_OTHER): Payer: Medicare Other | Admitting: Family Medicine

## 2017-06-23 DIAGNOSIS — R55 Syncope and collapse: Secondary | ICD-10-CM | POA: Diagnosis not present

## 2017-06-23 DIAGNOSIS — I5043 Acute on chronic combined systolic (congestive) and diastolic (congestive) heart failure: Secondary | ICD-10-CM

## 2017-06-23 DIAGNOSIS — I2510 Atherosclerotic heart disease of native coronary artery without angina pectoris: Secondary | ICD-10-CM | POA: Diagnosis not present

## 2017-06-23 DIAGNOSIS — N183 Chronic kidney disease, stage 3 (moderate): Secondary | ICD-10-CM | POA: Diagnosis not present

## 2017-06-23 DIAGNOSIS — I951 Orthostatic hypotension: Secondary | ICD-10-CM | POA: Diagnosis not present

## 2017-06-23 DIAGNOSIS — G20 Parkinson's disease: Secondary | ICD-10-CM

## 2017-06-23 DIAGNOSIS — S0012XD Contusion of left eyelid and periocular area, subsequent encounter: Secondary | ICD-10-CM

## 2017-06-23 DIAGNOSIS — I130 Hypertensive heart and chronic kidney disease with heart failure and stage 1 through stage 4 chronic kidney disease, or unspecified chronic kidney disease: Secondary | ICD-10-CM | POA: Diagnosis not present

## 2017-06-23 DIAGNOSIS — N179 Acute kidney failure, unspecified: Secondary | ICD-10-CM

## 2017-06-24 DIAGNOSIS — G20 Parkinson's disease: Secondary | ICD-10-CM | POA: Diagnosis not present

## 2017-06-24 DIAGNOSIS — R55 Syncope and collapse: Secondary | ICD-10-CM | POA: Diagnosis not present

## 2017-06-29 DIAGNOSIS — R55 Syncope and collapse: Secondary | ICD-10-CM | POA: Diagnosis not present

## 2017-06-29 DIAGNOSIS — G20 Parkinson's disease: Secondary | ICD-10-CM | POA: Diagnosis not present

## 2017-07-07 ENCOUNTER — Ambulatory Visit: Payer: Medicare Other | Admitting: Internal Medicine

## 2017-07-28 DIAGNOSIS — M48062 Spinal stenosis, lumbar region with neurogenic claudication: Secondary | ICD-10-CM | POA: Diagnosis not present

## 2017-07-28 DIAGNOSIS — M5416 Radiculopathy, lumbar region: Secondary | ICD-10-CM | POA: Diagnosis not present

## 2017-07-28 DIAGNOSIS — M1611 Unilateral primary osteoarthritis, right hip: Secondary | ICD-10-CM | POA: Diagnosis not present

## 2017-07-28 DIAGNOSIS — M5126 Other intervertebral disc displacement, lumbar region: Secondary | ICD-10-CM | POA: Diagnosis not present

## 2017-08-13 DIAGNOSIS — M1611 Unilateral primary osteoarthritis, right hip: Secondary | ICD-10-CM | POA: Diagnosis not present

## 2017-08-25 ENCOUNTER — Other Ambulatory Visit: Payer: Self-pay | Admitting: Family Medicine

## 2017-08-25 NOTE — Telephone Encounter (Signed)
Patient is calling to see if her needs to continue taking midodrine (PROAMATINE) 10 MG tablet.  He states that the bottle has Gladstone Lighter as the prescriber.  He is not sure who that is and states it may be a doctor from when he was in the hospital.  If you do want him to continue taking this he states that he will need a refill on this sent to [facility].  Please call patient and let him know.

## 2017-08-25 NOTE — Telephone Encounter (Signed)
Please advise 

## 2017-08-27 MED ORDER — MIDODRINE HCL 10 MG PO TABS: 10.0000 mg | ORAL_TABLET | Freq: Three times a day (TID) | ORAL | 1 refills | Status: DC

## 2017-08-27 NOTE — Telephone Encounter (Signed)
Yes, he should continue this medications. Have sent refill to [facility]

## 2017-08-27 NOTE — Telephone Encounter (Signed)
Patient's wife was notified.  

## 2017-09-14 DIAGNOSIS — G20 Parkinson's disease: Secondary | ICD-10-CM | POA: Diagnosis not present

## 2017-09-14 DIAGNOSIS — G4752 REM sleep behavior disorder: Secondary | ICD-10-CM | POA: Diagnosis not present

## 2017-09-14 DIAGNOSIS — G903 Multi-system degeneration of the autonomic nervous system: Secondary | ICD-10-CM | POA: Diagnosis not present

## 2017-09-14 DIAGNOSIS — Z8659 Personal history of other mental and behavioral disorders: Secondary | ICD-10-CM | POA: Diagnosis not present

## 2017-09-14 DIAGNOSIS — R413 Other amnesia: Secondary | ICD-10-CM | POA: Diagnosis not present

## 2017-09-18 ENCOUNTER — Encounter: Payer: Self-pay | Admitting: Family Medicine

## 2017-09-18 ENCOUNTER — Ambulatory Visit (INDEPENDENT_AMBULATORY_CARE_PROVIDER_SITE_OTHER): Payer: Medicare Other | Admitting: Family Medicine

## 2017-09-18 VITALS — BP 148/84 | HR 70 | Temp 97.4°F | Resp 18

## 2017-09-18 DIAGNOSIS — R55 Syncope and collapse: Secondary | ICD-10-CM | POA: Diagnosis not present

## 2017-09-18 DIAGNOSIS — I951 Orthostatic hypotension: Secondary | ICD-10-CM | POA: Diagnosis not present

## 2017-09-18 NOTE — Patient Instructions (Addendum)
   Please schedule routine follow up with Dr. Saunders Revel in then next 4-6 weeks

## 2017-09-18 NOTE — Progress Notes (Signed)
Patient: Keith Woods Male    DOB: August 18, 1925   84 y.o.   MRN: [id number] Visit Date: 09/18/2017  Today's Provider: [name], MD   Chief Complaint  Patient presents with  . Hypotension  . Loss of Consciousness   Subjective:    HPI Orthostatic Hypotension: Patient was last seen for this problem 3 months ago and no changes were made. Patient reports good compliance with treatment.  Syncope: Patient reports a single episode of passing out this morning after getting out of the shower. EMS was called and an EKG was done. Patient was not seen in the ER. Patient states the paramedics believe his blood pressure may have dropped due to the steam from the shower. Patients daughter reports that 2 days ago patient had a significant amount of blood in his underwear. Otherwise he reports he has been in his usual state of health until this morning. Is taking Midodrine consistently. Did take it before syncopal episode this morning, but had not eaten. He fell a few weeks ago when tripped. Was seen on 2-4- by neurology and ordered a walker which they have not gotten yet. No chest pains or palpitations. Feels a little nauseated since passing out this morning, but no vomiting or abdominal pain.     Allergies  Allergen Reactions  . Lipitor [Atorvastatin Calcium]     Leg pain  . Nsaids   . Statins     Leg pain     Current Outpatient Medications:  .  allopurinol (ZYLOPRIM) 100 MG tablet, TAKE 2 TABLETS DAILY, Disp: 180 tablet, Rfl: 4 .  aspirin EC 81 MG EC tablet, Take 1 tablet (81 mg total) by mouth daily., Disp: 30 tablet, Rfl: 0 .  b complex vitamins tablet, Take 1 tablet by mouth daily., Disp: , Rfl:  .  carbidopa-levodopa (SINEMET IR) 25-100 MG tablet, Take 1 tablet by mouth 3 (three) times daily., Disp: , Rfl:  .  citalopram (CELEXA) 10 MG tablet, take 1 tablet by mouth twice a day, Disp: 60 tablet, Rfl: 12 .  Colchicine (MITIGARE) 0.6 MG CAPS, Two tablets today, then 1  tablet daily, Disp: 30 capsule, Rfl: 3 .  midodrine (PROAMATINE) 10 MG tablet, Take 1 tablet (10 mg total) by mouth 3 (three) times daily with meals., Disp: 90 tablet, Rfl: 1 .  Multiple Vitamin (MULTIVITAMIN) tablet, Take 1 tablet by mouth daily.  , Disp: , Rfl:  .  nitroGLYCERIN (NITROSTAT) 0.4 MG SL tablet, Place 1 tablet (0.4 mg total) under the tongue every 5 (five) minutes as needed for chest pain., Disp: 25 tablet, Rfl: prn .  Probiotic Product (ALIGN) 4 MG CAPS, Take 4 mg by mouth daily., Disp: , Rfl:  .  sodium bicarbonate 650 MG tablet, Take 650 mg by mouth 2 (two) times daily., Disp: , Rfl: 0 .  Vitamin D, Ergocalciferol, (DRISDOL) 50000 units CAPS capsule, TAKE 1 CAPSULE ONCE A WEEK, Disp: 12 capsule, Rfl: 4  Review of Systems  Constitutional: Negative for appetite change, chills and fever.  Respiratory: Negative for chest tightness, shortness of breath and wheezing.   Cardiovascular: Negative for chest pain and palpitations.  Gastrointestinal: Positive for nausea. Negative for abdominal pain and vomiting.  Neurological:       Syncope    Social History   Tobacco Use  . Smoking status: Former Research scientist (life sciences)  . Smokeless tobacco: Never Used  Substance Use Topics  . Alcohol use: No   Objective:   BP Marland Kitchen)  148/84 (BP Location: Right Arm, Cuff Size: Normal)   Pulse 70   Temp (!) 97.4 F (36.3 C) (Oral)   Resp 18   SpO2 96% Comment: room air Vitals:   09/18/17 1141 09/18/17 1143  BP: (!) 146/82 (!) 148/84  Pulse: 70   Resp: 18   Temp: (!) 97.4 F (36.3 C)   TempSrc: Oral   SpO2: 96%      Physical Exam   General Appearance:    Alert, cooperative, no distress, sitting comfortably in wheelchair.   Eyes:    PERRL, conjunctiva/corneas clear, EOM's intact       Lungs:     Clear to auscultation bilaterally, respirations unlabored  Heart:    Regular rate and rhythm  Neurologic:   Awake, alert, oriented x 3. No apparent focal neurological           defect.             Assessment & Plan:     1. Syncope, unspecified syncope type  - Comprehensive metabolic panel - CBC  2. Orthostatic hypotension BP has been stable although was orthostatic when checked by EMS this morning. Consider he is slightly hypertensive while leave medications unchanged for the time being.        [name], MD  [facility]

## 2017-09-19 LAB — COMPREHENSIVE METABOLIC PANEL
A/G RATIO: 1.4 (ref 1.2–2.2)
ALT: 10 IU/L (ref 0–44)
AST: 27 IU/L (ref 0–40)
Albumin: 4.4 g/dL (ref 3.5–4.7)
Alkaline Phosphatase: 153 IU/L — ABNORMAL HIGH (ref 39–117)
BILIRUBIN TOTAL: 0.4 mg/dL (ref 0.0–1.2)
BUN/Creatinine Ratio: 14 (ref 10–24)
BUN: 30 mg/dL — ABNORMAL HIGH (ref 8–27)
CO2: 23 mmol/L (ref 20–29)
Calcium: 10.4 mg/dL — ABNORMAL HIGH (ref 8.6–10.2)
Chloride: 101 mmol/L (ref 96–106)
Creatinine, Ser: 2.12 mg/dL — ABNORMAL HIGH (ref 0.76–1.27)
GFR calc Af Amer: 31 mL/min/{1.73_m2} — ABNORMAL LOW (ref >59)
GFR, EST NON AFRICAN AMERICAN: 27 mL/min/{1.73_m2} — AB (ref >59)
GLOBULIN, TOTAL: 3.1 g/dL (ref 1.5–4.5)
Glucose: 123 mg/dL — ABNORMAL HIGH (ref 65–99)
Potassium: 4.5 mmol/L (ref 3.5–5.2)
SODIUM: 140 mmol/L (ref 134–144)
Total Protein: 7.5 g/dL (ref 6.0–8.5)

## 2017-09-19 LAB — CBC
Hematocrit: 38.4 % (ref 37.5–51.0)
Hemoglobin: 12.8 g/dL — ABNORMAL LOW (ref 13.0–17.7)
MCH: 32.2 pg (ref 26.6–33.0)
MCHC: 33.3 g/dL (ref 31.5–35.7)
MCV: 97 fL (ref 79–97)
PLATELETS: 251 10*3/uL (ref 150–379)
RBC: 3.98 x10E6/uL — AB (ref 4.14–5.80)
RDW: 14.1 % (ref 12.3–15.4)
WBC: 8.8 10*3/uL (ref 3.4–10.8)

## 2017-09-22 ENCOUNTER — Ambulatory Visit: Payer: Medicare Other | Admitting: Family Medicine

## 2017-09-25 ENCOUNTER — Other Ambulatory Visit: Payer: Self-pay

## 2017-09-25 ENCOUNTER — Emergency Department: Payer: Medicare Other

## 2017-09-25 ENCOUNTER — Inpatient Hospital Stay
Admission: EM | Admit: 2017-09-25 | Discharge: 2017-10-02 | DRG: 665 | Disposition: A | Discharge status: Hospice | Payer: Medicare Other | Attending: Family Medicine | Admitting: Family Medicine

## 2017-09-25 DIAGNOSIS — R339 Retention of urine, unspecified: Secondary | ICD-10-CM | POA: Diagnosis not present

## 2017-09-25 DIAGNOSIS — Z87891 Personal history of nicotine dependence: Secondary | ICD-10-CM

## 2017-09-25 DIAGNOSIS — Z978 Presence of other specified devices: Secondary | ICD-10-CM

## 2017-09-25 DIAGNOSIS — N183 Chronic kidney disease, stage 3 (moderate): Secondary | ICD-10-CM | POA: Diagnosis present

## 2017-09-25 DIAGNOSIS — Z7982 Long term (current) use of aspirin: Secondary | ICD-10-CM

## 2017-09-25 DIAGNOSIS — A419 Sepsis, unspecified organism: Secondary | ICD-10-CM | POA: Diagnosis not present

## 2017-09-25 DIAGNOSIS — I951 Orthostatic hypotension: Secondary | ICD-10-CM | POA: Diagnosis present

## 2017-09-25 DIAGNOSIS — R739 Hyperglycemia, unspecified: Secondary | ICD-10-CM | POA: Diagnosis not present

## 2017-09-25 DIAGNOSIS — Z8673 Personal history of transient ischemic attack (TIA), and cerebral infarction without residual deficits: Secondary | ICD-10-CM

## 2017-09-25 DIAGNOSIS — G9341 Metabolic encephalopathy: Secondary | ICD-10-CM | POA: Diagnosis not present

## 2017-09-25 DIAGNOSIS — R55 Syncope and collapse: Secondary | ICD-10-CM

## 2017-09-25 DIAGNOSIS — N3041 Irradiation cystitis with hematuria: Secondary | ICD-10-CM | POA: Diagnosis not present

## 2017-09-25 DIAGNOSIS — R319 Hematuria, unspecified: Secondary | ICD-10-CM | POA: Diagnosis not present

## 2017-09-25 DIAGNOSIS — N029 Recurrent and persistent hematuria with unspecified morphologic changes: Secondary | ICD-10-CM | POA: Diagnosis present

## 2017-09-25 DIAGNOSIS — F05 Delirium due to known physiological condition: Secondary | ICD-10-CM | POA: Diagnosis not present

## 2017-09-25 DIAGNOSIS — R310 Gross hematuria: Secondary | ICD-10-CM | POA: Diagnosis not present

## 2017-09-25 DIAGNOSIS — I2510 Atherosclerotic heart disease of native coronary artery without angina pectoris: Secondary | ICD-10-CM | POA: Diagnosis present

## 2017-09-25 DIAGNOSIS — N1330 Unspecified hydronephrosis: Secondary | ICD-10-CM | POA: Diagnosis not present

## 2017-09-25 DIAGNOSIS — J9601 Acute respiratory failure with hypoxia: Secondary | ICD-10-CM | POA: Diagnosis not present

## 2017-09-25 DIAGNOSIS — E785 Hyperlipidemia, unspecified: Secondary | ICD-10-CM | POA: Diagnosis present

## 2017-09-25 DIAGNOSIS — I5023 Acute on chronic systolic (congestive) heart failure: Secondary | ICD-10-CM | POA: Diagnosis not present

## 2017-09-25 DIAGNOSIS — R6520 Severe sepsis without septic shock: Secondary | ICD-10-CM | POA: Diagnosis not present

## 2017-09-25 DIAGNOSIS — I480 Paroxysmal atrial fibrillation: Secondary | ICD-10-CM | POA: Diagnosis not present

## 2017-09-25 DIAGNOSIS — E872 Acidosis: Secondary | ICD-10-CM | POA: Diagnosis not present

## 2017-09-25 DIAGNOSIS — N170 Acute kidney failure with tubular necrosis: Secondary | ICD-10-CM | POA: Diagnosis present

## 2017-09-25 DIAGNOSIS — R338 Other retention of urine: Secondary | ICD-10-CM

## 2017-09-25 DIAGNOSIS — Z515 Encounter for palliative care: Secondary | ICD-10-CM | POA: Diagnosis not present

## 2017-09-25 DIAGNOSIS — Z66 Do not resuscitate: Secondary | ICD-10-CM | POA: Diagnosis present

## 2017-09-25 DIAGNOSIS — G20 Parkinson's disease: Secondary | ICD-10-CM | POA: Diagnosis present

## 2017-09-25 DIAGNOSIS — I255 Ischemic cardiomyopathy: Secondary | ICD-10-CM | POA: Diagnosis present

## 2017-09-25 DIAGNOSIS — Z79899 Other long term (current) drug therapy: Secondary | ICD-10-CM

## 2017-09-25 DIAGNOSIS — N139 Obstructive and reflux uropathy, unspecified: Secondary | ICD-10-CM | POA: Diagnosis present

## 2017-09-25 DIAGNOSIS — D62 Acute posthemorrhagic anemia: Secondary | ICD-10-CM | POA: Diagnosis present

## 2017-09-25 DIAGNOSIS — I248 Other forms of acute ischemic heart disease: Secondary | ICD-10-CM | POA: Diagnosis not present

## 2017-09-25 DIAGNOSIS — N136 Pyonephrosis: Secondary | ICD-10-CM | POA: Diagnosis present

## 2017-09-25 DIAGNOSIS — K219 Gastro-esophageal reflux disease without esophagitis: Secondary | ICD-10-CM | POA: Diagnosis present

## 2017-09-25 DIAGNOSIS — I130 Hypertensive heart and chronic kidney disease with heart failure and stage 1 through stage 4 chronic kidney disease, or unspecified chronic kidney disease: Secondary | ICD-10-CM | POA: Diagnosis present

## 2017-09-25 DIAGNOSIS — F0390 Unspecified dementia without behavioral disturbance: Secondary | ICD-10-CM | POA: Diagnosis present

## 2017-09-25 DIAGNOSIS — I447 Left bundle-branch block, unspecified: Secondary | ICD-10-CM | POA: Diagnosis present

## 2017-09-25 DIAGNOSIS — J9690 Respiratory failure, unspecified, unspecified whether with hypoxia or hypercapnia: Secondary | ICD-10-CM

## 2017-09-25 LAB — CBC
HCT: 39.2 % — ABNORMAL LOW (ref 40.0–52.0)
Hemoglobin: 13.1 g/dL (ref 13.0–18.0)
MCH: 32.1 pg (ref 26.0–34.0)
MCHC: 33.4 g/dL (ref 32.0–36.0)
MCV: 96 fL (ref 80.0–100.0)
Platelets: 267 10*3/uL (ref 150–440)
RBC: 4.08 MIL/uL — ABNORMAL LOW (ref 4.40–5.90)
RDW: 14.9 % — ABNORMAL HIGH (ref 11.5–14.5)
WBC: 13.6 10*3/uL — ABNORMAL HIGH (ref 3.8–10.6)

## 2017-09-25 LAB — BASIC METABOLIC PANEL
Anion gap: 15 (ref 5–15)
BUN: 37 mg/dL — AB (ref 6–20)
CALCIUM: 10.3 mg/dL (ref 8.9–10.3)
CO2: 21 mmol/L — ABNORMAL LOW (ref 22–32)
CREATININE: 2.47 mg/dL — AB (ref 0.61–1.24)
Chloride: 102 mmol/L (ref 101–111)
GFR calc Af Amer: 25 mL/min — ABNORMAL LOW (ref >60)
GFR calc non Af Amer: 22 mL/min — ABNORMAL LOW (ref >60)
Glucose, Bld: 137 mg/dL — ABNORMAL HIGH (ref 65–99)
Potassium: 4.8 mmol/L (ref 3.5–5.1)
SODIUM: 138 mmol/L (ref 135–145)

## 2017-09-25 MED ORDER — LIDOCAINE HCL 2 % EX GEL
CUTANEOUS | Status: AC
  Administered 2017-09-25
  Filled 2017-09-25: qty 10

## 2017-09-25 MED ORDER — FENTANYL CITRATE (PF) 100 MCG/2ML IJ SOLN
50.0000 ug | INTRAMUSCULAR | Status: DC | PRN
  Administered 2017-09-25 – 2017-09-29 (×3): 50 ug via INTRAVENOUS
  Filled 2017-09-25 (×3): qty 2

## 2017-09-25 NOTE — ED Triage Notes (Signed)
Pt arrived via EMS from home with complaints of syncope. EMS stated that he has been passing out with exertion or standing up too fast. Pt was on the toilet today when Ems arrived and they stated that he passed out. Pt has an 18 gauge in Left AC. Pt has a Hx of heart block, but does not know which kind. Pt currently has blood in his urine. He states that there was also blood in his urine 2 weeks ago but it "cleared up". Pt is extremely diaphoretic, nauseous, and vomited when he arrived to ED. Pt has a bruise on right abdomin. VS per EMS BP-157/62 HR- 90s  O2sat- 90s.

## 2017-09-25 NOTE — ED Notes (Signed)
Bladder Scan- 622 ml

## 2017-09-25 NOTE — ED Provider Notes (Signed)
[facility] Emergency Department Provider Note    First MD Initiated Contact with Patient 09/25/17 2052     (approximate)  I have reviewed the triage vital signs and the nursing notes.   HISTORY  Chief Complaint Near Syncope    HPI Keith Woods is a 84 y.o. male multiple chronic comorbidities presents to the ER for syncopal event that occurred after the patient was standing up too fast.  Patient states that he is having worsening suprapubic pain.  Has also noticed hematuria.  Was recently treated for UTI 2 weeks ago but it cleared up.  Denies any fevers.  Denies any chest pain or shortness of breath.  Denies any numbness or tingling or headache.  Is having moderate to severe suprapubic pain and urged to empty his bladder.  No blood thinners  Past Medical History:  Diagnosis Date  . Chronic kidney disease   . Coronary artery disease    NONOBSTRUCTIVE by cath 2008  . GERD (gastroesophageal reflux disease)   . History of prostate cancer   . History of transient ischemic attack (TIA)   . Hyperlipidemia   . Hypertension   . LBBB (left bundle branch block)   . Stroke ([place]) 02/2016   Hemorrhagic  . Syncope and collapse    Family History  Problem Relation Age of Onset  . Ovarian cancer Mother   . Tuberculosis Mother   . Prostate cancer Father    Past Surgical History:  Procedure Laterality Date  . CARDIAC CATHETERIZATION  08/28/2006   EF 60%  . TONSILLECTOMY    . TRANSTHORACIC ECHOCARDIOGRAM  10/10/2008   EF 55%   Patient Active Problem List   Diagnosis Date Noted  . Dilated cardiomyopathy ([place]) - Presumed to be Ischemic; EF 25-30% 05/30/2017  . NSTEMI (non-ST elevated myocardial infarction) ([place]) 05/29/2017  . Chronic combined systolic and diastolic heart failure ([place]) 01/01/2017  . Depression, recurrent ([place]) 09/23/2016  . Blood in urine 06/30/2016  . Orthostatic hypotension 06/24/2016  . Cardiomyopathy, ischemic 05/08/2016  . Severe  left ventricular systolic dysfunction 81/85/6314  . Weakness of both legs 03/30/2016  . History of CVA (cerebrovascular accident) 03/08/2016  . Frequent falls 02/15/2016  . Parkinson's disease ([place]) 02/15/2016  . PMR (polymyalgia rheumatica) ([facility]) 12/13/2015  . Cerebral microvascular disease 12/13/2015  . Vitamin D deficiency 10/31/2015  . Anemia 10/31/2015  . Syncope 10/15/2015  . Gout 08/15/2015  . Fatigue 07/08/2015  . Right leg pain 05/18/2015  . Acute gout 05/18/2015  . Wrist pain 03/15/2015  . Chronic kidney disease 02/02/2015  . Vertigo 02/02/2015  . Hearing loss 02/02/2015  . H/O malignant neoplasm of prostate 12/06/2012  . LBBB (left bundle branch block)   . Labile hypertension   . Coronary artery disease   . Hyperlipidemia       Prior to Admission medications   Medication Sig Start Date End Date Taking? Authorizing Provider  allopurinol (ZYLOPRIM) 100 MG tablet TAKE 2 TABLETS DAILY 06/17/17   [name], MD  aspirin EC 81 MG EC tablet Take 1 tablet (81 mg total) by mouth daily. 06/29/16   Hugelmeyer, Alexis, DO  b complex vitamins tablet Take 1 tablet by mouth daily.    [provider]  carbidopa-levodopa (SINEMET IR) 25-100 MG tablet Take 1 tablet by mouth 3 (three) times daily. 12/23/16   [provider]  citalopram (CELEXA) 10 MG tablet take 1 tablet by mouth twice a day 02/23/17   [name], MD  Colchicine (MITIGARE) 0.6 MG CAPS Two tablets today, then 1 tablet daily 07/20/15   [name], MD  midodrine (PROAMATINE) 10 MG tablet Take 1 tablet (10 mg total) by mouth 3 (three) times daily with meals. 08/27/17   [name], MD  Multiple Vitamin (MULTIVITAMIN) tablet Take 1 tablet by mouth daily.      [provider]  nitroGLYCERIN (NITROSTAT) 0.4 MG SL tablet Place 1 tablet (0.4 mg total) under the tongue every 5 (five) minutes as needed for chest pain. 06/09/17   End, Harrell Gave, MD  Probiotic Product (ALIGN) 4 MG CAPS  Take 4 mg by mouth daily.    [provider]  sodium bicarbonate 650 MG tablet Take 650 mg by mouth 2 (two) times daily. 01/05/15   [provider]  Vitamin D, Ergocalciferol, (DRISDOL) 50000 units CAPS capsule TAKE 1 CAPSULE ONCE A WEEK 12/18/16   [name], MD    Allergies Lipitor [atorvastatin calcium]; Nsaids; and Statins    Social History Social History   Tobacco Use  . Smoking status: Former Research scientist (life sciences)  . Smokeless tobacco: Never Used  Substance Use Topics  . Alcohol use: No  . Drug use: No    Review of Systems Patient denies headaches, rhinorrhea, blurry vision, numbness, shortness of breath, chest pain, edema, cough, abdominal pain, nausea, vomiting, diarrhea, dysuria, fevers, rashes or hallucinations unless otherwise stated above in HPI. ____________________________________________   PHYSICAL EXAM:  VITAL SIGNS: Vitals:   09/25/17 2200 09/25/17 2230  BP: (!) 174/100 (!) 158/85  Pulse: 98 94  Resp: (!) 23 (!) 27  Temp:    SpO2: 100% 100%    Constitutional: Alert chronically ill appearing  in no acute distress. Eyes: Conjunctivae are normal.  Head: Atraumatic. Nose: No congestion/rhinnorhea. Mouth/Throat: Mucous membranes are moist.   Neck: No stridor. Painless ROM.  Cardiovascular: Normal rate, regular rhythm. Grossly normal heart sounds.  Good peripheral circulation. Respiratory: Normal respiratory effort.  No retractions. Lungs CTAB. Gastrointestinal: Soft but with suprapubic fullness and ttp. No distention. No abdominal bruits. No CVA tenderness. Genitourinary: normal external genitalia with trace blood from urethral meatus Musculoskeletal: No lower extremity tenderness nor edema.  No joint effusions. Neurologic:  Normal speech and language. No gross focal neurologic deficits are appreciated. No facial droop Skin:  Skin is warm, dry and intact. No rash noted. Psychiatric: Mood and affect are normal. Speech and behavior are  normal.  ____________________________________________   LABS (all labs ordered are listed, but only abnormal results are displayed)  Results for orders placed or performed during the hospital encounter of 09/25/17 (from the past 24 hour(s))  Basic metabolic panel     Status: Abnormal   Collection Time: 09/25/17  9:08 PM  Result Value Ref Range   Sodium 138 135 - 145 mmol/L   Potassium 4.8 3.5 - 5.1 mmol/L   Chloride 102 101 - 111 mmol/L   CO2 21 (L) 22 - 32 mmol/L   Glucose, Bld 137 (H) 65 - 99 mg/dL   BUN 37 (H) 6 - 20 mg/dL   Creatinine, Ser 2.47 (H) 0.61 - 1.24 mg/dL   Calcium 10.3 8.9 - 10.3 mg/dL   GFR calc non Af Amer 22 (L) >60 mL/min   GFR calc Af Amer 25 (L) >60 mL/min   Anion gap 15 5 - 15  CBC     Status: Abnormal   Collection Time: 09/25/17  9:08 PM  Result Value Ref Range   WBC 13.6 (H) 3.8 - 10.6  K/uL   RBC 4.08 (L) 4.40 - 5.90 MIL/uL   Hemoglobin 13.1 13.0 - 18.0 g/dL   HCT 39.2 (L) 40.0 - 52.0 %   MCV 96.0 80.0 - 100.0 fL   MCH 32.1 26.0 - 34.0 pg   MCHC 33.4 32.0 - 36.0 g/dL   RDW 14.9 (H) 11.5 - 14.5 %   Platelets 267 150 - 440 K/uL   ____________________________________________  EKG My review and personal interpretation at Time: 20:47   Indication: near syncope  Rate: 95  Rhythm: sinus Axis: left  Other: lbbb, no stemi criteria, normal intervals ____________________________________________  RADIOLOGY  I personally reviewed all radiographic images ordered to evaluate for the above acute complaints and reviewed radiology reports and findings.  These findings were personally discussed with the patient.  Please see medical record for radiology report.  ____________________________________________   PROCEDURES  Procedure(s) performed:  Procedures    Critical Care performed: no ____________________________________________   INITIAL IMPRESSION / ASSESSMENT AND PLAN / ED COURSE  Pertinent labs & imaging results that were available during my  care of the patient were reviewed by me and considered in my medical decision making (see chart for details).  DDX: uti, obstruction, stone, malignancy, dehydration, dysrhythmia, acs  Deanta Mincey Zaremba is a 84 y.o. who presents to the ED with syncopal event and symptoms as described above most clinically consistent with acute urinary obstruction given hematuria suprapubic fullness.  CT imaging will be ordered to evaluate for traumatic injury, malignancy or stone.  Due to his acute urinary retention will place three-way Foley and irrigate as he does have evidence of hematuria.  Will check for urinary tract infection.  Near syncopal episode seems less consistent with central or cardiac episode.  Possible vasovagal in the setting of bladder retention.  Will provide pain medication.  Patient will be signed out to Dr. Dahlia Client pending follow-up on urinalysis CT imaging and clearance after continuous bladder irrigation.      ____________________________________________   FINAL CLINICAL IMPRESSION(S) / ED DIAGNOSES  Final diagnoses:  Acute urinary retention  Gross hematuria      NEW MEDICATIONS STARTED DURING THIS VISIT:  New Prescriptions   No medications on file     Note:  This document was prepared using Dragon voice recognition software and may include unintentional dictation errors.    Merlyn Lot, MD 09/25/17 2356

## 2017-09-26 ENCOUNTER — Other Ambulatory Visit: Payer: Self-pay

## 2017-09-26 ENCOUNTER — Inpatient Hospital Stay: Payer: Medicare Other | Admitting: Anesthesiology

## 2017-09-26 ENCOUNTER — Encounter
Admission: EM | Disposition: A | Discharge status: Hospice | Payer: Self-pay | Source: Home / Self Care | Attending: Family Medicine

## 2017-09-26 ENCOUNTER — Emergency Department: Payer: Medicare Other

## 2017-09-26 DIAGNOSIS — I255 Ischemic cardiomyopathy: Secondary | ICD-10-CM | POA: Diagnosis not present

## 2017-09-26 DIAGNOSIS — F05 Delirium due to known physiological condition: Secondary | ICD-10-CM | POA: Diagnosis not present

## 2017-09-26 DIAGNOSIS — R4182 Altered mental status, unspecified: Secondary | ICD-10-CM | POA: Diagnosis not present

## 2017-09-26 DIAGNOSIS — I340 Nonrheumatic mitral (valve) insufficiency: Secondary | ICD-10-CM | POA: Diagnosis not present

## 2017-09-26 DIAGNOSIS — R918 Other nonspecific abnormal finding of lung field: Secondary | ICD-10-CM | POA: Diagnosis not present

## 2017-09-26 DIAGNOSIS — N189 Chronic kidney disease, unspecified: Secondary | ICD-10-CM | POA: Diagnosis not present

## 2017-09-26 DIAGNOSIS — N3041 Irradiation cystitis with hematuria: Secondary | ICD-10-CM | POA: Diagnosis present

## 2017-09-26 DIAGNOSIS — A419 Sepsis, unspecified organism: Secondary | ICD-10-CM | POA: Diagnosis not present

## 2017-09-26 DIAGNOSIS — N136 Pyonephrosis: Secondary | ICD-10-CM | POA: Diagnosis present

## 2017-09-26 DIAGNOSIS — I5042 Chronic combined systolic (congestive) and diastolic (congestive) heart failure: Secondary | ICD-10-CM | POA: Diagnosis not present

## 2017-09-26 DIAGNOSIS — Z4682 Encounter for fitting and adjustment of non-vascular catheter: Secondary | ICD-10-CM | POA: Diagnosis not present

## 2017-09-26 DIAGNOSIS — I21A9 Other myocardial infarction type: Secondary | ICD-10-CM | POA: Diagnosis not present

## 2017-09-26 DIAGNOSIS — Z9911 Dependence on respirator [ventilator] status: Secondary | ICD-10-CM | POA: Diagnosis not present

## 2017-09-26 DIAGNOSIS — R319 Hematuria, unspecified: Secondary | ICD-10-CM | POA: Diagnosis not present

## 2017-09-26 DIAGNOSIS — R6520 Severe sepsis without septic shock: Secondary | ICD-10-CM | POA: Diagnosis not present

## 2017-09-26 DIAGNOSIS — E785 Hyperlipidemia, unspecified: Secondary | ICD-10-CM | POA: Diagnosis present

## 2017-09-26 DIAGNOSIS — I951 Orthostatic hypotension: Secondary | ICD-10-CM | POA: Diagnosis present

## 2017-09-26 DIAGNOSIS — R55 Syncope and collapse: Secondary | ICD-10-CM | POA: Diagnosis not present

## 2017-09-26 DIAGNOSIS — I130 Hypertensive heart and chronic kidney disease with heart failure and stage 1 through stage 4 chronic kidney disease, or unspecified chronic kidney disease: Secondary | ICD-10-CM | POA: Diagnosis present

## 2017-09-26 DIAGNOSIS — I878 Other specified disorders of veins: Secondary | ICD-10-CM | POA: Diagnosis not present

## 2017-09-26 DIAGNOSIS — Z8673 Personal history of transient ischemic attack (TIA), and cerebral infarction without residual deficits: Secondary | ICD-10-CM | POA: Diagnosis not present

## 2017-09-26 DIAGNOSIS — R000 Tachycardia, unspecified: Secondary | ICD-10-CM | POA: Diagnosis not present

## 2017-09-26 DIAGNOSIS — N1330 Unspecified hydronephrosis: Secondary | ICD-10-CM | POA: Diagnosis not present

## 2017-09-26 DIAGNOSIS — N139 Obstructive and reflux uropathy, unspecified: Secondary | ICD-10-CM | POA: Diagnosis not present

## 2017-09-26 DIAGNOSIS — N170 Acute kidney failure with tubular necrosis: Secondary | ICD-10-CM | POA: Diagnosis present

## 2017-09-26 DIAGNOSIS — I420 Dilated cardiomyopathy: Secondary | ICD-10-CM | POA: Diagnosis not present

## 2017-09-26 DIAGNOSIS — N183 Chronic kidney disease, stage 3 (moderate): Secondary | ICD-10-CM | POA: Diagnosis not present

## 2017-09-26 DIAGNOSIS — R338 Other retention of urine: Secondary | ICD-10-CM | POA: Diagnosis not present

## 2017-09-26 DIAGNOSIS — N3091 Cystitis, unspecified with hematuria: Secondary | ICD-10-CM | POA: Diagnosis not present

## 2017-09-26 DIAGNOSIS — I2510 Atherosclerotic heart disease of native coronary artery without angina pectoris: Secondary | ICD-10-CM | POA: Diagnosis present

## 2017-09-26 DIAGNOSIS — N179 Acute kidney failure, unspecified: Secondary | ICD-10-CM | POA: Diagnosis not present

## 2017-09-26 DIAGNOSIS — G20 Parkinson's disease: Secondary | ICD-10-CM | POA: Diagnosis present

## 2017-09-26 DIAGNOSIS — Z515 Encounter for palliative care: Secondary | ICD-10-CM | POA: Diagnosis not present

## 2017-09-26 DIAGNOSIS — G9341 Metabolic encephalopathy: Secondary | ICD-10-CM | POA: Diagnosis not present

## 2017-09-26 DIAGNOSIS — R310 Gross hematuria: Secondary | ICD-10-CM | POA: Diagnosis not present

## 2017-09-26 DIAGNOSIS — K219 Gastro-esophageal reflux disease without esophagitis: Secondary | ICD-10-CM | POA: Diagnosis present

## 2017-09-26 DIAGNOSIS — N401 Enlarged prostate with lower urinary tract symptoms: Secondary | ICD-10-CM | POA: Diagnosis not present

## 2017-09-26 DIAGNOSIS — I248 Other forms of acute ischemic heart disease: Secondary | ICD-10-CM | POA: Diagnosis not present

## 2017-09-26 DIAGNOSIS — I5023 Acute on chronic systolic (congestive) heart failure: Secondary | ICD-10-CM | POA: Diagnosis not present

## 2017-09-26 DIAGNOSIS — R54 Age-related physical debility: Secondary | ICD-10-CM | POA: Diagnosis not present

## 2017-09-26 DIAGNOSIS — N029 Recurrent and persistent hematuria with unspecified morphologic changes: Secondary | ICD-10-CM | POA: Diagnosis present

## 2017-09-26 DIAGNOSIS — J9690 Respiratory failure, unspecified, unspecified whether with hypoxia or hypercapnia: Secondary | ICD-10-CM | POA: Diagnosis not present

## 2017-09-26 DIAGNOSIS — R748 Abnormal levels of other serum enzymes: Secondary | ICD-10-CM | POA: Diagnosis not present

## 2017-09-26 DIAGNOSIS — G9340 Encephalopathy, unspecified: Secondary | ICD-10-CM | POA: Diagnosis not present

## 2017-09-26 DIAGNOSIS — N3289 Other specified disorders of bladder: Secondary | ICD-10-CM | POA: Diagnosis not present

## 2017-09-26 DIAGNOSIS — Z7401 Bed confinement status: Secondary | ICD-10-CM | POA: Diagnosis not present

## 2017-09-26 DIAGNOSIS — E872 Acidosis: Secondary | ICD-10-CM | POA: Diagnosis not present

## 2017-09-26 DIAGNOSIS — D62 Acute posthemorrhagic anemia: Secondary | ICD-10-CM | POA: Diagnosis present

## 2017-09-26 DIAGNOSIS — Z66 Do not resuscitate: Secondary | ICD-10-CM | POA: Diagnosis present

## 2017-09-26 DIAGNOSIS — J9601 Acute respiratory failure with hypoxia: Secondary | ICD-10-CM | POA: Diagnosis not present

## 2017-09-26 DIAGNOSIS — I447 Left bundle-branch block, unspecified: Secondary | ICD-10-CM | POA: Diagnosis present

## 2017-09-26 DIAGNOSIS — I4891 Unspecified atrial fibrillation: Secondary | ICD-10-CM | POA: Diagnosis not present

## 2017-09-26 DIAGNOSIS — R404 Transient alteration of awareness: Secondary | ICD-10-CM | POA: Diagnosis not present

## 2017-09-26 HISTORY — PX: HEMATOMA EVACUATION: SHX5118

## 2017-09-26 HISTORY — PX: TRANSURETHRAL RESECTION OF PROSTATE: SHX73

## 2017-09-26 HISTORY — PX: CYSTOSCOPY WITH STENT PLACEMENT: SHX5790

## 2017-09-26 HISTORY — PX: CYSTOSCOPY/RETROGRADE/URETEROSCOPY: SHX5316

## 2017-09-26 HISTORY — PX: TRANSURETHRAL RESECTION OF BLADDER TUMOR: SHX2575

## 2017-09-26 HISTORY — PX: FULGURATION OF BLADDER TUMOR: SHX6261

## 2017-09-26 LAB — BASIC METABOLIC PANEL
ANION GAP: 14 (ref 5–15)
BUN: 44 mg/dL — ABNORMAL HIGH (ref 6–20)
CALCIUM: 9.6 mg/dL (ref 8.9–10.3)
CO2: 19 mmol/L — ABNORMAL LOW (ref 22–32)
CREATININE: 2.62 mg/dL — AB (ref 0.61–1.24)
Chloride: 103 mmol/L (ref 101–111)
GFR calc Af Amer: 23 mL/min — ABNORMAL LOW (ref >60)
GFR, EST NON AFRICAN AMERICAN: 20 mL/min — AB (ref >60)
GLUCOSE: 204 mg/dL — AB (ref 65–99)
Potassium: 4.9 mmol/L (ref 3.5–5.1)
Sodium: 136 mmol/L (ref 135–145)

## 2017-09-26 LAB — CBC
HCT: 32.7 % — ABNORMAL LOW (ref 40.0–52.0)
HEMOGLOBIN: 11.2 g/dL — AB (ref 13.0–18.0)
MCH: 32.5 pg (ref 26.0–34.0)
MCHC: 34.2 g/dL (ref 32.0–36.0)
MCV: 95.1 fL (ref 80.0–100.0)
PLATELETS: 228 10*3/uL (ref 150–440)
RBC: 3.44 MIL/uL — ABNORMAL LOW (ref 4.40–5.90)
RDW: 15 % — AB (ref 11.5–14.5)
WBC: 12.7 10*3/uL — ABNORMAL HIGH (ref 3.8–10.6)

## 2017-09-26 LAB — TROPONIN I
TROPONIN I: 0.1 ng/mL — AB (ref <0.03)
TROPONIN I: 0.14 ng/mL — AB (ref <0.03)

## 2017-09-26 SURGERY — TURBT (TRANSURETHRAL RESECTION OF BLADDER TUMOR)
Anesthesia: General

## 2017-09-26 MED ORDER — ETOMIDATE 2 MG/ML IV SOLN
INTRAVENOUS | Status: DC | PRN
  Administered 2017-09-26: 20 mg via INTRAVENOUS

## 2017-09-26 MED ORDER — DEXAMETHASONE SODIUM PHOSPHATE 10 MG/ML IJ SOLN
INTRAMUSCULAR | Status: DC | PRN
  Administered 2017-09-26: 5 mg via INTRAVENOUS

## 2017-09-26 MED ORDER — NITROGLYCERIN 0.4 MG SL SUBL: 0.4000 mg | SUBLINGUAL_TABLET | SUBLINGUAL | Status: DC | PRN

## 2017-09-26 MED ORDER — EPHEDRINE SULFATE 50 MG/ML IJ SOLN
INTRAMUSCULAR | Status: AC
  Filled 2017-09-26: qty 1

## 2017-09-26 MED ORDER — SUCCINYLCHOLINE CHLORIDE 20 MG/ML IJ SOLN
INTRAMUSCULAR | Status: AC
  Filled 2017-09-26: qty 1

## 2017-09-26 MED ORDER — COLCHICINE 0.6 MG PO TABS
0.6000 mg | ORAL_TABLET | Freq: Every day | ORAL | Status: DC
  Administered 2017-09-26 – 2017-09-27 (×2): 0.6 mg via ORAL
  Filled 2017-09-26 (×2): qty 1

## 2017-09-26 MED ORDER — RISAQUAD PO CAPS
1.0000 | ORAL_CAPSULE | Freq: Every day | ORAL | Status: DC
  Administered 2017-09-26 – 2017-09-27 (×2): 1 via ORAL
  Filled 2017-09-26 (×2): qty 1

## 2017-09-26 MED ORDER — ONDANSETRON HCL 4 MG PO TABS: 4.0000 mg | ORAL_TABLET | Freq: Four times a day (QID) | ORAL | Status: DC | PRN

## 2017-09-26 MED ORDER — MIDODRINE HCL 5 MG PO TABS
10.0000 mg | ORAL_TABLET | Freq: Three times a day (TID) | ORAL | Status: DC
  Administered 2017-09-26 – 2017-09-27 (×5): 10 mg via ORAL
  Filled 2017-09-26 (×11): qty 2

## 2017-09-26 MED ORDER — DEXTROSE-NACL 5-0.2 % IV SOLN
INTRAVENOUS | Status: DC
  Administered 2017-09-26 (×3): via INTRAVENOUS

## 2017-09-26 MED ORDER — ALLOPURINOL 100 MG PO TABS
200.0000 mg | ORAL_TABLET | Freq: Every day | ORAL | Status: DC
  Administered 2017-09-26 – 2017-09-27 (×2): 200 mg via ORAL
  Filled 2017-09-26 (×2): qty 2

## 2017-09-26 MED ORDER — ROCURONIUM BROMIDE 50 MG/5ML IV SOLN
INTRAVENOUS | Status: AC
  Filled 2017-09-26: qty 1

## 2017-09-26 MED ORDER — LACTATED RINGERS IV SOLN
INTRAVENOUS | Status: DC | PRN
  Administered 2017-09-26: 06:00:00 via INTRAVENOUS

## 2017-09-26 MED ORDER — CITALOPRAM HYDROBROMIDE 20 MG PO TABS
10.0000 mg | ORAL_TABLET | Freq: Two times a day (BID) | ORAL | Status: DC
  Administered 2017-09-26 – 2017-09-28 (×5): 10 mg via ORAL
  Filled 2017-09-26 (×5): qty 1

## 2017-09-26 MED ORDER — SUGAMMADEX SODIUM 200 MG/2ML IV SOLN
INTRAVENOUS | Status: DC | PRN
  Administered 2017-09-26: 155.2 mg via INTRAVENOUS

## 2017-09-26 MED ORDER — POLYETHYLENE GLYCOL 3350 17 G PO PACK: 17.0000 g | PACK | Freq: Every day | ORAL | Status: DC | PRN

## 2017-09-26 MED ORDER — LIDOCAINE HCL (PF) 2 % IJ SOLN
INTRAMUSCULAR | Status: AC
  Filled 2017-09-26: qty 10

## 2017-09-26 MED ORDER — VASOPRESSIN 20 UNIT/ML IV SOLN
INTRAVENOUS | Status: DC | PRN
  Administered 2017-09-26: 1 [IU] via INTRAVENOUS

## 2017-09-26 MED ORDER — ROCURONIUM BROMIDE 100 MG/10ML IV SOLN
INTRAVENOUS | Status: DC | PRN
  Administered 2017-09-26: 20 mg via INTRAVENOUS

## 2017-09-26 MED ORDER — VITAMIN D (ERGOCALCIFEROL) 1.25 MG (50000 UNIT) PO CAPS
50000.0000 [IU] | ORAL_CAPSULE | ORAL | Status: DC
  Filled 2017-09-26: qty 1

## 2017-09-26 MED ORDER — DEXAMETHASONE SODIUM PHOSPHATE 10 MG/ML IJ SOLN
INTRAMUSCULAR | Status: AC
  Filled 2017-09-26: qty 1

## 2017-09-26 MED ORDER — ACETAMINOPHEN 650 MG RE SUPP: 650.0000 mg | Freq: Four times a day (QID) | RECTAL | Status: DC | PRN

## 2017-09-26 MED ORDER — FENTANYL CITRATE (PF) 100 MCG/2ML IJ SOLN: 25.0000 ug | INTRAMUSCULAR | Status: DC | PRN

## 2017-09-26 MED ORDER — PHENYLEPHRINE HCL 10 MG/ML IJ SOLN
INTRAMUSCULAR | Status: DC | PRN
  Administered 2017-09-26 (×3): 100 ug via INTRAVENOUS

## 2017-09-26 MED ORDER — HYDROCODONE-ACETAMINOPHEN 5-325 MG PO TABS
1.0000 | ORAL_TABLET | ORAL | Status: DC | PRN
  Administered 2017-09-26: 2 via ORAL
  Filled 2017-09-26: qty 2

## 2017-09-26 MED ORDER — ACETAMINOPHEN 325 MG PO TABS: 650.0000 mg | ORAL_TABLET | Freq: Four times a day (QID) | ORAL | Status: DC | PRN

## 2017-09-26 MED ORDER — CEFAZOLIN SODIUM-DEXTROSE 2-3 GM-%(50ML) IV SOLR
INTRAVENOUS | Status: DC | PRN
  Administered 2017-09-26: 2 g via INTRAVENOUS

## 2017-09-26 MED ORDER — FENTANYL CITRATE (PF) 100 MCG/2ML IJ SOLN
INTRAMUSCULAR | Status: AC
  Filled 2017-09-26: qty 2

## 2017-09-26 MED ORDER — FENTANYL CITRATE (PF) 100 MCG/2ML IJ SOLN
INTRAMUSCULAR | Status: DC | PRN
  Administered 2017-09-26: 50 ug via INTRAVENOUS

## 2017-09-26 MED ORDER — ONDANSETRON HCL 4 MG/2ML IJ SOLN
4.0000 mg | Freq: Four times a day (QID) | INTRAMUSCULAR | Status: DC | PRN
  Administered 2017-09-26: 4 mg via INTRAVENOUS

## 2017-09-26 MED ORDER — CEFAZOLIN SODIUM-DEXTROSE 2-4 GM/100ML-% IV SOLN
INTRAVENOUS | Status: AC
  Filled 2017-09-26: qty 100

## 2017-09-26 MED ORDER — PHENYLEPHRINE HCL 10 MG/ML IJ SOLN
INTRAMUSCULAR | Status: AC
  Filled 2017-09-26: qty 1

## 2017-09-26 MED ORDER — CARBIDOPA-LEVODOPA 25-100 MG PO TABS
1.0000 | ORAL_TABLET | Freq: Two times a day (BID) | ORAL | Status: DC
  Administered 2017-09-26 – 2017-09-28 (×5): 1 via ORAL
  Filled 2017-09-26 (×8): qty 1

## 2017-09-26 MED ORDER — ADULT MULTIVITAMIN W/MINERALS CH
1.0000 | ORAL_TABLET | Freq: Every day | ORAL | Status: DC
  Administered 2017-09-26 – 2017-09-27 (×2): 1 via ORAL
  Filled 2017-09-26 (×2): qty 1

## 2017-09-26 MED ORDER — ONDANSETRON HCL 4 MG/2ML IJ SOLN
INTRAMUSCULAR | Status: AC
  Filled 2017-09-26: qty 2

## 2017-09-26 MED ORDER — ETOMIDATE 2 MG/ML IV SOLN
INTRAVENOUS | Status: AC
  Filled 2017-09-26: qty 10

## 2017-09-26 MED ORDER — OXYCODONE HCL 5 MG/5ML PO SOLN: 5.0000 mg | Freq: Once | ORAL | Status: DC | PRN

## 2017-09-26 MED ORDER — OXYCODONE HCL 5 MG PO TABS: 5.0000 mg | ORAL_TABLET | Freq: Once | ORAL | Status: DC | PRN

## 2017-09-26 MED ORDER — B COMPLEX PO TABS: 1.0000 | ORAL_TABLET | Freq: Every day | ORAL | Status: DC

## 2017-09-26 MED ORDER — DOCUSATE SODIUM 100 MG PO CAPS
100.0000 mg | ORAL_CAPSULE | Freq: Two times a day (BID) | ORAL | Status: DC
  Administered 2017-09-26 – 2017-09-27 (×4): 100 mg via ORAL
  Filled 2017-09-26 (×5): qty 1

## 2017-09-26 MED ORDER — SODIUM BICARBONATE 650 MG PO TABS
650.0000 mg | ORAL_TABLET | Freq: Two times a day (BID) | ORAL | Status: DC
  Administered 2017-09-26 – 2017-09-28 (×5): 650 mg via ORAL
  Filled 2017-09-26 (×5): qty 1

## 2017-09-26 MED ORDER — LIDOCAINE HCL (CARDIAC) 20 MG/ML IV SOLN
INTRAVENOUS | Status: DC | PRN
  Administered 2017-09-26: 80 mg via INTRAVENOUS

## 2017-09-26 MED ORDER — OXYBUTYNIN CHLORIDE 5 MG PO TABS
5.0000 mg | ORAL_TABLET | Freq: Three times a day (TID) | ORAL | Status: DC
  Administered 2017-09-26 – 2017-09-28 (×8): 5 mg via ORAL
  Filled 2017-09-26 (×9): qty 1

## 2017-09-26 SURGICAL SUPPLY — 39 items
BAG DRAIN CYSTO-URO LG1000N (MISCELLANEOUS) ×10 IMPLANT
BAG URINE DRAINAGE (UROLOGICAL SUPPLIES) ×5 IMPLANT
BRUSH SCRUB EZ  4% CHG (MISCELLANEOUS) ×2
BRUSH SCRUB EZ 4% CHG (MISCELLANEOUS) ×3 IMPLANT
CATH FOLEY 2WAY  5CC 16FR (CATHETERS) ×2
CATH FOLEY 2WAY 5CC 16FR (CATHETERS) ×3
CATH URETL 5X70 OPEN END (CATHETERS) ×5 IMPLANT
CATH URTH 16FR FL 2W BLN LF (CATHETERS) ×3 IMPLANT
CONRAY 43 FOR UROLOGY 50M (MISCELLANEOUS) ×5 IMPLANT
DRAPE UTILITY 15X26 TOWEL STRL (DRAPES) ×5 IMPLANT
DRSG TELFA 4X3 1S NADH ST (GAUZE/BANDAGES/DRESSINGS) ×5 IMPLANT
ELECT LOOP 22F BIPOLAR SML (ELECTROSURGICAL) ×5
ELECT REM PT RETURN 9FT ADLT (ELECTROSURGICAL) ×5
ELECTRODE LOOP 22F BIPOLAR SML (ELECTROSURGICAL) ×3 IMPLANT
ELECTRODE REM PT RTRN 9FT ADLT (ELECTROSURGICAL) ×3 IMPLANT
GLOVE BIO SURGEON STRL SZ 6.5 (GLOVE) ×4 IMPLANT
GLOVE BIO SURGEONS STRL SZ 6.5 (GLOVE) ×1
GLOVE BIOGEL M 6.5 STRL (GLOVE) ×5 IMPLANT
GOWN STRL REUS W/ TWL LRG LVL3 (GOWN DISPOSABLE) ×6 IMPLANT
GOWN STRL REUS W/TWL LRG LVL3 (GOWN DISPOSABLE) ×10
INTRODUCER DILATOR DOUBLE (INTRODUCER) ×5 IMPLANT
KIT TURNOVER CYSTO (KITS) ×5 IMPLANT
LOOP CUT BIPOLAR 24F LRG (ELECTROSURGICAL) IMPLANT
NDL SAFETY ECLIPSE 18X1.5 (NEEDLE) ×3 IMPLANT
NEEDLE HYPO 18GX1.5 SHARP (NEEDLE) ×5
PACK CYSTO AR (MISCELLANEOUS) ×5 IMPLANT
SENSORWIRE 0.038 NOT ANGLED (WIRE) ×10
SET CYSTO W/LG BORE CLAMP LF (SET/KITS/TRAYS/PACK) ×5 IMPLANT
SET IRRIG Y TYPE TUR BLADDER L (SET/KITS/TRAYS/PACK) ×5 IMPLANT
SET IRRIGATING DISP (SET/KITS/TRAYS/PACK) ×5 IMPLANT
SHEATH URETERAL 12FRX35CM (MISCELLANEOUS) ×5 IMPLANT
SOL .9 NS 3000ML IRR  AL (IV SOLUTION) ×2
SOL .9 NS 3000ML IRR AL (IV SOLUTION) ×3
SOL .9 NS 3000ML IRR UROMATIC (IV SOLUTION) ×3 IMPLANT
SURGILUBE 2OZ TUBE FLIPTOP (MISCELLANEOUS) ×5 IMPLANT
SYRINGE IRR TOOMEY STRL 70CC (SYRINGE) ×5 IMPLANT
WATER STERILE IRR 1000ML POUR (IV SOLUTION) ×5 IMPLANT
WATER STERILE IRR 3000ML UROMA (IV SOLUTION) ×5 IMPLANT
WIRE SENSOR 0.038 NOT ANGLED (WIRE) ×6 IMPLANT

## 2017-09-26 NOTE — ED Provider Notes (Signed)
-----------------------------------------   2:50 AM on 09/26/2017 -----------------------------------------   Blood pressure 119/79, pulse (!) 104, temperature (!) 97.5 F (36.4 C), temperature source Oral, resp. rate (!) 28, height 6\' 2"  (1.88 m), weight 77.6 kg (171 lb), SpO2 98 %.  Assuming care from Dr. Quentin Cornwall.  In short, Keith Woods is a 84 y.o. male with a chief complaint of Near Syncope .  Refer to the original H&P for additional details.  The current plan of care is to follow up the results of the CT and disposition the patient.   CT abd and pelvis: Distended urinary bladder filled with high density material likely blood clot in the setting of hematuria, large bladder mass not entirely excluded, mild bilateral hydroureteronephrosis  The patient will be admitted to the hospitalist service for further evaluation as he is still undergoing continuous bladder irrigation.       Loney Hering, MD 09/26/17 [id number]

## 2017-09-26 NOTE — Progress Notes (Signed)
15 minute call to floor. 

## 2017-09-26 NOTE — Anesthesia Post-op Follow-up Note (Signed)
Anesthesia QCDR form completed.        

## 2017-09-26 NOTE — H&P (View-Only) (Signed)
H&P Physician requesting consult: Loney Hering, MD  Chief Complaint: hematuria, bilateral hydronephrosis  History of Present Illness: 84 year old male with multiple medical problems including significant cardiac disease and a history of stroke.  He also has Parkinson's disease.  In the chart, it is listed that he has a history of prostate cancer and it looks like he may have brachytherapy seeds in his prostate.  He however denies a history of prostate cancer.  He states that he has had hematuria for 3-4 days.  Hemoglobin was stable in the emergency department where he presented with acute urinary retention secondary to clot retention.  Foley catheter was placed and he underwent a CT scan.  CT scan revealed mild bilateral hydronephrosis and a bladder full of a large amount of clot.  Mass could not be excluded.  He had multiple bilateral cyst in the kidney but no obvious large renal mass.  When I evaluated the patient, he was awake, alert, oriented x3.  I flushed and irrigated his catheter with return of clot and dark red urine.  He continued to have significant hematuria despite my irrigation.  Continuous bladder irrigation had already been started and I started this back.  He is not on blood thinners.  He has been admitted to the hospitalist service and he has been n.p.o. since yesterday.  He was also noted to have worsening creatinine.  Past Medical History:  Diagnosis Date  . Chronic kidney disease   . Coronary artery disease    NONOBSTRUCTIVE by cath 2008  . GERD (gastroesophageal reflux disease)   . History of prostate cancer   . History of transient ischemic attack (TIA)   . Hyperlipidemia   . Hypertension   . LBBB (left bundle branch block)   . Stroke ([place]) 02/2016   Hemorrhagic  . Syncope and collapse    Past Surgical History:  Procedure Laterality Date  . CARDIAC CATHETERIZATION  08/28/2006   EF 60%  . TONSILLECTOMY    . TRANSTHORACIC ECHOCARDIOGRAM  10/10/2008   EF 55%    Home  Medications:  Medications Prior to Admission  Medication Sig Dispense Refill Last Dose  . allopurinol (ZYLOPRIM) 100 MG tablet TAKE 2 TABLETS DAILY 180 tablet 4 Past Week at Unknown time  . aspirin EC 81 MG EC tablet Take 1 tablet (81 mg total) by mouth daily. 30 tablet 0 Past Week at Unknown time  . b complex vitamins tablet Take 1 tablet by mouth daily.   Past Week at Unknown time  . carbidopa-levodopa (SINEMET IR) 25-100 MG tablet Take 1 tablet by mouth 2 (two) times daily.    Past Week at Unknown time  . citalopram (CELEXA) 10 MG tablet take 1 tablet by mouth twice a day 60 tablet 12 Past Week at Unknown time  . Colchicine (MITIGARE) 0.6 MG CAPS Two tablets today, then 1 tablet daily 30 capsule 3 Past Week at Unknown time  . midodrine (PROAMATINE) 10 MG tablet Take 1 tablet (10 mg total) by mouth 3 (three) times daily with meals. 90 tablet 1 Past Week at Unknown time  . Multiple Vitamin (MULTIVITAMIN) tablet Take 1 tablet by mouth daily.     Past Week at Unknown time  . nitroGLYCERIN (NITROSTAT) 0.4 MG SL tablet Place 1 tablet (0.4 mg total) under the tongue every 5 (five) minutes as needed for chest pain. 25 tablet prn prn at prn  . Probiotic Product (ALIGN) 4 MG CAPS Take 4 mg by mouth daily.   Past Week at  Unknown time  . sodium bicarbonate 650 MG tablet Take 650 mg by mouth 2 (two) times daily.  0 Past Week at Unknown time  . Vitamin D, Ergocalciferol, (DRISDOL) 50000 units CAPS capsule TAKE 1 CAPSULE ONCE A WEEK 12 capsule 4 Past Week at Unknown time   Allergies:  Allergies  Allergen Reactions  . Lipitor [Atorvastatin Calcium]     Leg pain  . Nsaids   . Statins     Leg pain    Family History  Problem Relation Age of Onset  . Ovarian cancer Mother   . Tuberculosis Mother   . Prostate cancer Father    Social History:  reports that he has quit smoking. he has never used smokeless tobacco. He reports that he does not drink alcohol or use drugs.  ROS: A complete review of  systems was performed.  All systems are negative except for pertinent findings as noted. ROS   Physical Exam:  Vital signs in last 24 hours: Temp:  [97.5 F (36.4 C)-99.6 F (37.6 C)] 99.6 F (37.6 C) (02/16 0426) Pulse Rate:  [54-116] 75 (02/16 0436) Resp:  [18-30] 18 (02/16 0426) BP: (75-179)/(46-119) 123/94 (02/16 0436) SpO2:  [93 %-100 %] 99 % (02/16 0426) Weight:  [77.6 kg (171 lb)] 77.6 kg (171 lb) (02/15 2058) General:  Alert and oriented, No acute distress HEENT: Normocephalic, atraumatic Neck: No JVD or lymphadenopathy Cardiovascular: Regular rate and rhythm Lungs: Regular rate and effort Abdomen: Soft, nontender, nondistended, no abdominal masses Back: No CVA tenderness GU: 22 French three-way Foley catheter in place with light red urine with CBI on a fast rate.  Bladder irrigation revealed multiple clots and continued significant hematuria. Extremities: No edema Neurologic: Grossly intact  Laboratory Data:  Results for orders placed or performed during the hospital encounter of 09/25/17 (from the past 24 hour(s))  Basic metabolic panel     Status: Abnormal   Collection Time: 09/25/17  9:08 PM  Result Value Ref Range   Sodium 138 135 - 145 mmol/L   Potassium 4.8 3.5 - 5.1 mmol/L   Chloride 102 101 - 111 mmol/L   CO2 21 (L) 22 - 32 mmol/L   Glucose, Bld 137 (H) 65 - 99 mg/dL   BUN 37 (H) 6 - 20 mg/dL   Creatinine, Ser 2.47 (H) 0.61 - 1.24 mg/dL   Calcium 10.3 8.9 - 10.3 mg/dL   GFR calc non Af Amer 22 (L) >60 mL/min   GFR calc Af Amer 25 (L) >60 mL/min   Anion gap 15 5 - 15  CBC     Status: Abnormal   Collection Time: 09/25/17  9:08 PM  Result Value Ref Range   WBC 13.6 (H) 3.8 - 10.6 K/uL   RBC 4.08 (L) 4.40 - 5.90 MIL/uL   Hemoglobin 13.1 13.0 - 18.0 g/dL   HCT 39.2 (L) 40.0 - 52.0 %   MCV 96.0 80.0 - 100.0 fL   MCH 32.1 26.0 - 34.0 pg   MCHC 33.4 32.0 - 36.0 g/dL   RDW 14.9 (H) 11.5 - 14.5 %   Platelets 267 150 - 440 K/uL   No results found for this  or any previous visit (from the past 240 hour(s)). Creatinine: Recent Labs    09/25/17 2108  CREATININE 2.47*   CT: Personally reviewed, bilateral mild hydronephrosis.  Multiple bilateral renal cysts but no significant concerning renal mass.  Large amount of clot in the bladder, unable to rule out possible underlying mass versus just a  large amount of clot.  The bladder was quite distended despite Foley catheter placement due to the amount of clot.  Impression/Assessment:  Hematuria Bilateral mild hydronephrosis Acute renal insufficiency on top of chronic renal insufficiency  Plan:  Proceed to the operating room for cystoscopy, clot evacuation, fulguration, possible transurethral resection of bladder tumor, possible bilateral retrograde pyelogram, possible bilateral ureteral stent placement.  I suspect his bilateral mild hydronephrosis is secondary to urinary retention secondary to clot retention, so I doubt I will end up placing ureteral stents but this depends on findings in the operating room.  Also suspect that this was the cause of his acute renal insufficiency.  Marton Redwood, III 09/26/2017, 5:08 AM

## 2017-09-26 NOTE — Interval H&P Note (Signed)
History and Physical Interval Note:  09/26/2017 5:25 AM  Keith Woods  has presented today for surgery, with the diagnosis of hematuria  The various methods of treatment have been discussed with the patient and family. After consideration of risks, benefits and other options for treatment, the patient has consented to  Procedure(s): TRANSURETHRAL RESECTION OF BLADDER TUMOR (TURBT) (N/A) possible CYSTOSCOPY WITH STENT PLACEMENT (Bilateral) POSSIBLE FULGURATION (N/A) EVACUATION HEMATOMA (N/A) RETROGRADE as a surgical intervention .  The patient's history has been reviewed, patient examined, no change in status, stable for surgery.  I have reviewed the patient's chart and labs.  Questions were answered to the patient's satisfaction.     Marton Redwood, III

## 2017-09-26 NOTE — Consult Note (Signed)
H&P Physician requesting consult: Loney Hering, MD  Chief Complaint: hematuria, bilateral hydronephrosis  History of Present Illness: 84 year old male with multiple medical problems including significant cardiac disease and a history of stroke.  He also has Parkinson's disease.  In the chart, it is listed that he has a history of prostate cancer and it looks like he may have brachytherapy seeds in his prostate.  He however denies a history of prostate cancer.  He states that he has had hematuria for 3-4 days.  Hemoglobin was stable in the emergency department where he presented with acute urinary retention secondary to clot retention.  Foley catheter was placed and he underwent a CT scan.  CT scan revealed mild bilateral hydronephrosis and a bladder full of a large amount of clot.  Mass could not be excluded.  He had multiple bilateral cyst in the kidney but no obvious large renal mass.  When I evaluated the patient, he was awake, alert, oriented x3.  I flushed and irrigated his catheter with return of clot and dark red urine.  He continued to have significant hematuria despite my irrigation.  Continuous bladder irrigation had already been started and I started this back.  He is not on blood thinners.  He has been admitted to the hospitalist service and he has been n.p.o. since yesterday.  He was also noted to have worsening creatinine.  Past Medical History:  Diagnosis Date  . Chronic kidney disease   . Coronary artery disease    NONOBSTRUCTIVE by cath 2008  . GERD (gastroesophageal reflux disease)   . History of prostate cancer   . History of transient ischemic attack (TIA)   . Hyperlipidemia   . Hypertension   . LBBB (left bundle branch block)   . Stroke ([place]) 02/2016   Hemorrhagic  . Syncope and collapse    Past Surgical History:  Procedure Laterality Date  . CARDIAC CATHETERIZATION  08/28/2006   EF 60%  . TONSILLECTOMY    . TRANSTHORACIC ECHOCARDIOGRAM  10/10/2008   EF 55%    Home  Medications:  Medications Prior to Admission  Medication Sig Dispense Refill Last Dose  . allopurinol (ZYLOPRIM) 100 MG tablet TAKE 2 TABLETS DAILY 180 tablet 4 Past Week at Unknown time  . aspirin EC 81 MG EC tablet Take 1 tablet (81 mg total) by mouth daily. 30 tablet 0 Past Week at Unknown time  . b complex vitamins tablet Take 1 tablet by mouth daily.   Past Week at Unknown time  . carbidopa-levodopa (SINEMET IR) 25-100 MG tablet Take 1 tablet by mouth 2 (two) times daily.    Past Week at Unknown time  . citalopram (CELEXA) 10 MG tablet take 1 tablet by mouth twice a day 60 tablet 12 Past Week at Unknown time  . Colchicine (MITIGARE) 0.6 MG CAPS Two tablets today, then 1 tablet daily 30 capsule 3 Past Week at Unknown time  . midodrine (PROAMATINE) 10 MG tablet Take 1 tablet (10 mg total) by mouth 3 (three) times daily with meals. 90 tablet 1 Past Week at Unknown time  . Multiple Vitamin (MULTIVITAMIN) tablet Take 1 tablet by mouth daily.     Past Week at Unknown time  . nitroGLYCERIN (NITROSTAT) 0.4 MG SL tablet Place 1 tablet (0.4 mg total) under the tongue every 5 (five) minutes as needed for chest pain. 25 tablet prn prn at prn  . Probiotic Product (ALIGN) 4 MG CAPS Take 4 mg by mouth daily.   Past Week at  Unknown time  . sodium bicarbonate 650 MG tablet Take 650 mg by mouth 2 (two) times daily.  0 Past Week at Unknown time  . Vitamin D, Ergocalciferol, (DRISDOL) 50000 units CAPS capsule TAKE 1 CAPSULE ONCE A WEEK 12 capsule 4 Past Week at Unknown time   Allergies:  Allergies  Allergen Reactions  . Lipitor [Atorvastatin Calcium]     Leg pain  . Nsaids   . Statins     Leg pain    Family History  Problem Relation Age of Onset  . Ovarian cancer Mother   . Tuberculosis Mother   . Prostate cancer Father    Social History:  reports that he has quit smoking. he has never used smokeless tobacco. He reports that he does not drink alcohol or use drugs.  ROS: A complete review of  systems was performed.  All systems are negative except for pertinent findings as noted. ROS   Physical Exam:  Vital signs in last 24 hours: Temp:  [97.5 F (36.4 C)-99.6 F (37.6 C)] 99.6 F (37.6 C) (02/16 0426) Pulse Rate:  [54-116] 75 (02/16 0436) Resp:  [18-30] 18 (02/16 0426) BP: (75-179)/(46-119) 123/94 (02/16 0436) SpO2:  [93 %-100 %] 99 % (02/16 0426) Weight:  [77.6 kg (171 lb)] 77.6 kg (171 lb) (02/15 2058) General:  Alert and oriented, No acute distress HEENT: Normocephalic, atraumatic Neck: No JVD or lymphadenopathy Cardiovascular: Regular rate and rhythm Lungs: Regular rate and effort Abdomen: Soft, nontender, nondistended, no abdominal masses Back: No CVA tenderness GU: 22 French three-way Foley catheter in place with light red urine with CBI on a fast rate.  Bladder irrigation revealed multiple clots and continued significant hematuria. Extremities: No edema Neurologic: Grossly intact  Laboratory Data:  Results for orders placed or performed during the hospital encounter of 09/25/17 (from the past 24 hour(s))  Basic metabolic panel     Status: Abnormal   Collection Time: 09/25/17  9:08 PM  Result Value Ref Range   Sodium 138 135 - 145 mmol/L   Potassium 4.8 3.5 - 5.1 mmol/L   Chloride 102 101 - 111 mmol/L   CO2 21 (L) 22 - 32 mmol/L   Glucose, Bld 137 (H) 65 - 99 mg/dL   BUN 37 (H) 6 - 20 mg/dL   Creatinine, Ser 2.47 (H) 0.61 - 1.24 mg/dL   Calcium 10.3 8.9 - 10.3 mg/dL   GFR calc non Af Amer 22 (L) >60 mL/min   GFR calc Af Amer 25 (L) >60 mL/min   Anion gap 15 5 - 15  CBC     Status: Abnormal   Collection Time: 09/25/17  9:08 PM  Result Value Ref Range   WBC 13.6 (H) 3.8 - 10.6 K/uL   RBC 4.08 (L) 4.40 - 5.90 MIL/uL   Hemoglobin 13.1 13.0 - 18.0 g/dL   HCT 39.2 (L) 40.0 - 52.0 %   MCV 96.0 80.0 - 100.0 fL   MCH 32.1 26.0 - 34.0 pg   MCHC 33.4 32.0 - 36.0 g/dL   RDW 14.9 (H) 11.5 - 14.5 %   Platelets 267 150 - 440 K/uL   No results found for this  or any previous visit (from the past 240 hour(s)). Creatinine: Recent Labs    09/25/17 2108  CREATININE 2.47*   CT: Personally reviewed, bilateral mild hydronephrosis.  Multiple bilateral renal cysts but no significant concerning renal mass.  Large amount of clot in the bladder, unable to rule out possible underlying mass versus just a  large amount of clot.  The bladder was quite distended despite Foley catheter placement due to the amount of clot.  Impression/Assessment:  Hematuria Bilateral mild hydronephrosis Acute renal insufficiency on top of chronic renal insufficiency  Plan:  Proceed to the operating room for cystoscopy, clot evacuation, fulguration, possible transurethral resection of bladder tumor, possible bilateral retrograde pyelogram, possible bilateral ureteral stent placement.  I suspect his bilateral mild hydronephrosis is secondary to urinary retention secondary to clot retention, so I doubt I will end up placing ureteral stents but this depends on findings in the operating room.  Also suspect that this was the cause of his acute renal insufficiency.  Marton Redwood, III 09/26/2017, 5:08 AM

## 2017-09-26 NOTE — Anesthesia Procedure Notes (Signed)
Procedure Name: Intubation Date/Time: 09/26/2017 6:25 AM Performed by: Doreen Salvage, CRNA Pre-anesthesia Checklist: Patient identified, Patient being monitored, Timeout performed, Emergency Drugs available and Suction available Patient Re-evaluated:Patient Re-evaluated prior to induction Oxygen Delivery Method: Circle system utilized Preoxygenation: Pre-oxygenation with 100% oxygen Induction Type: IV induction Ventilation: Mask ventilation without difficulty Laryngoscope Size: Mac, McGraph and 4 Grade View: Grade I Tube type: Oral Tube size: 7.5 mm Number of attempts: 1 Airway Equipment and Method: Stylet Placement Confirmation: ETT inserted through vocal cords under direct vision,  positive ETCO2 and breath sounds checked- equal and bilateral Secured at: 21 cm Tube secured with: Tape Dental Injury: Teeth and Oropharynx as per pre-operative assessment

## 2017-09-26 NOTE — Op Note (Addendum)
Operative Note  Preoperative diagnosis:  1.  Hematuria 2.  Bilateral mild hydronephrosis  Postoperative diagnosis: 1.  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  Bilateral mild hydronephrosis  Procedure(s): 1.  Cystourethroscopy with limited transurethral resection of the prostate, clot evacuation, fulguration  Surgeon: Link Snuffer, MD  Assistants: None  Anesthesia: General  Complications: None immediate  EBL: 50 cc  Specimens: 1.  Prostate chips  Drains/Catheters: 1.  24 French 3-way Foley catheter with CBI initiated  Intraoperative findings: 1.  Normal anterior urethra 2.  Inflamed hypervascular prostatic urethra with active bleeding at the bladder neck. 3.  Large clot burden evacuated. Diffuse erythema throughout the entire bladder.  Majority of bleeding was around the bladder neck.  Bilateral ureteral orifices were unable to be adequately visualized.  Some bleeding was located around the area that the ureteral orifice should be and therefore very limited cautery was used in that area. 4.  No evidence of any mass lesion.  No significant bleeding at the conclusion of the case.  Indication: 84 year old male with multiple medical comorbidities and significant cardiovascular disease as well as Parkinson's disease as well as a likely history of prostate cancer and brachytherapy who presented to the emergency department with a several day history of gross hematuria.  CT scan revealed mild bilateral hydronephrosis with a distended bladder full of clot despite Foley catheter placement.  There was too much clot present to be able to adequately irrigate the bladder manually.  There was also evidence of likely persistent bleeding and therefore the decision was made to proceed to the operating room.  Description of procedure:  The patient was identified and consent was obtained.  The patient was taken to the operating room and placed in the supine position.  The  patient was placed under the general anesthesia.  Perioperative antibiotics were administered.  The patient was placed in dorsal lithotomy.  Patient was prepped and draped in a standard sterile fashion and a timeout was performed.  A 26 French resectoscope with the visual obturator in place was advanced into the urethra and into the bladder.  There was a large amount of clot that was evacuated with a syringe.  The working element with a 24 loop was assembled and used to inspect the bladder.  The findings are noted above.  Electrocautery was used around the bladder neck.  I was unable to definitely see bilateral ureteral orifice ease.  I used electrocautery sparingly around the area that the ureteral orifices should be located.  There was some active bleeding that did have to be carefully fulgurated on the bladder mucosa but again I was not overly aggressive in the area of the ureteral orifices.  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  The most vascular areas were fulgurated on bipolar settings.  The patient had a moderate median lobe of the prostate.  I decided to perform a limited transurethral resection of the prostate.  I fulgurated all areas of bleeding and collected prostatic chips for specimen.  I reinspected the bladder and there is no evidence of any significant bleeding.  At this point, I withdrew the scope and placed a 24 French 3-way Foley catheter and initiated continuous bladder irrigation on a moderate drip.  This concluded the operation.  The patient tolerated the procedure well and was stable postoperatively.  Plan: The patient will be weaned off continuous bladder irrigation.  He may be a good candidate for outpatient hyperbaric oxygen considering the  degree of erythematous changes and radiation changes within the bladder.  At some point, he will need a renal ultrasound to confirm resolution of hydronephrosis.  I suspect this was secondary to  clot retention given there was no sign of mass that could be causing obstruction in the bladder.

## 2017-09-26 NOTE — Transfer of Care (Signed)
Immediate Anesthesia Transfer of Care Note  Patient: Keith Woods  Procedure(s) Performed: TRANSURETHRAL RESECTION OF BLADDER TUMOR (TURBT) (N/A ) possible CYSTOSCOPY WITH STENT PLACEMENT (Bilateral ) POSSIBLE FULGURATION (N/A ) EVACUATION HEMATOMA (N/A ) RETROGRADE PYELOGRAM TRANSURETHRAL RESECTION OF THE PROSTATE (TURP)  Patient Location: PACU  Anesthesia Type:General  Level of Consciousness: sedated  Airway & Oxygen Therapy: Patient Spontanous Breathing and Patient connected to face mask oxygen  Post-op Assessment: Report given to RN and Post -op Vital signs reviewed and stable  Post vital signs: Reviewed and stable  Last Vitals:  Vitals:   09/26/17 0426 09/26/17 0436  BP: (!) 75/46 (!) 123/94  Pulse: (!) 54 75  Resp: 18   Temp: 37.6 C   SpO2: 99%     Last Pain:  Vitals:   09/26/17 0426  TempSrc: Oral  PainSc:          Complications: No apparent anesthesia complications

## 2017-09-26 NOTE — OR Nursing (Signed)
Floor RN notified +troponin of 0.10. Dr. Amie Critchley notified no new orders at this time. Dr. Gloriann Loan also notified and acknowledged.

## 2017-09-26 NOTE — Progress Notes (Signed)
Lab notified primary nurse that pt Troponin was 0.10 Primary nurse called PACU and Dr. Amie Critchley made aware.

## 2017-09-26 NOTE — H&P (Signed)
[place] [facility] [place] NAME: Keith Woods    MR#:  [id number]  DATE OF BIRTH:  [birth year]  DATE OF ADMISSION:  09/25/2017  PRIMARY CARE PHYSICIAN: [name], MD   REQUESTING/REFERRING PHYSICIAN:   CHIEF COMPLAINT:   Chief Complaint  Patient presents with  . Near Syncope    HISTORY OF PRESENT ILLNESS: Keith Woods  is a 84 y.o. 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, chronic orthostatic hypotension on Midrin, presented to the hospital with acute urinary obstruction/inability to void, noted recurrent hematuria-first episode was 2 weeks ago which resolved was thought to be due to urinary tract infection, today patient had a syncopal episode while on the toilet, brought to the emergency room via EMS, ER workup noted for creatinine 2.4-slightly up from 2.1 last check, white count 13,000, CT abdomen noted for distended bladder with probable clot/mass cannot be excluded/bilateral mild hydronephrosis, patient evaluated emergency room with family present, patient is now been admitted for acute urinary obstruction secondary to bladder clot/hematuria with bilateral hydronephrosis.  PAST MEDICAL HISTORY:   Past Medical History:  Diagnosis Date  . Chronic kidney disease   . Coronary artery disease    NONOBSTRUCTIVE by cath 2008  . GERD (gastroesophageal reflux disease)   . History of prostate cancer   . History of transient ischemic attack (TIA)   . Hyperlipidemia   . Hypertension   . LBBB (left bundle branch block)   . Stroke ([place]) 02/2016   Hemorrhagic  . Syncope and collapse     PAST SURGICAL HISTORY:  Past Surgical History:  Procedure Laterality Date  . CARDIAC CATHETERIZATION  08/28/2006   EF 60%  . TONSILLECTOMY    . TRANSTHORACIC ECHOCARDIOGRAM  10/10/2008   EF 55%    SOCIAL HISTORY:  Social History   Tobacco Use  . Smoking status: Former Research scientist (life sciences)  . Smokeless  tobacco: Never Used  Substance Use Topics  . Alcohol use: No    FAMILY HISTORY:  Family History  Problem Relation Age of Onset  . Ovarian cancer Mother   . Tuberculosis Mother   . Prostate cancer Father     DRUG ALLERGIES:  Allergies  Allergen Reactions  . Lipitor [Atorvastatin Calcium]     Leg pain  . Nsaids   . Statins     Leg pain    REVIEW OF SYSTEMS:   CONSTITUTIONAL: No fever,+ fatigue/weakness.  EYES: No blurred or double vision.  EARS, NOSE, AND THROAT: No tinnitus or ear pain.  RESPIRATORY: No cough, shortness of breath, wheezing or hemoptysis.  CARDIOVASCULAR: No chest pain, orthopnea, edema.  GASTROINTESTINAL: No nausea, vomiting, diarrhea or abdominal pain.  GENITOURINARY: No dysuria, +hematuria/unable to void.  ENDOCRINE: No polyuria, nocturia,  HEMATOLOGY: No anemia, easy bruising or bleeding SKIN: No rash or lesion. MUSCULOSKELETAL: No joint pain or arthritis.   NEUROLOGIC: No tingling, numbness, weakness.  Recurrent syncopal episodes PSYCHIATRY: No anxiety or depression.   MEDICATIONS AT HOME:  Prior to Admission medications   Medication Sig Start Date End Date Taking? Authorizing Provider  allopurinol (ZYLOPRIM) 100 MG tablet TAKE 2 TABLETS DAILY 06/17/17  Yes [name], MD  aspirin EC 81 MG EC tablet Take 1 tablet (81 mg total) by mouth daily. 06/29/16  Yes Hugelmeyer, Alexis, DO  b complex vitamins tablet Take 1 tablet by mouth daily.   Yes [provider]  carbidopa-levodopa (SINEMET IR) 25-100 MG tablet Take 1  tablet by mouth 2 (two) times daily.  12/23/16  Yes [provider]  citalopram (CELEXA) 10 MG tablet take 1 tablet by mouth twice a day 02/23/17  Yes [name], MD  Colchicine (MITIGARE) 0.6 MG CAPS Two tablets today, then 1 tablet daily 07/20/15  Yes Fisher, Kirstie Peri, MD  midodrine (PROAMATINE) 10 MG tablet Take 1 tablet (10 mg total) by mouth 3 (three) times daily with meals. 08/27/17  Yes [name], MD   Multiple Vitamin (MULTIVITAMIN) tablet Take 1 tablet by mouth daily.     Yes [provider]  nitroGLYCERIN (NITROSTAT) 0.4 MG SL tablet Place 1 tablet (0.4 mg total) under the tongue every 5 (five) minutes as needed for chest pain. 06/09/17  Yes End, Harrell Gave, MD  Probiotic Product (ALIGN) 4 MG CAPS Take 4 mg by mouth daily.   Yes [provider]  sodium bicarbonate 650 MG tablet Take 650 mg by mouth 2 (two) times daily. 01/05/15  Yes [provider]  Vitamin D, Ergocalciferol, (DRISDOL) 50000 units CAPS capsule TAKE 1 CAPSULE ONCE A WEEK 12/18/16  Yes [name], MD      PHYSICAL EXAMINATION:   VITAL SIGNS: Blood pressure 113/66, pulse (!) 102, temperature (!) 97.5 F (36.4 C), temperature source Oral, resp. rate (!) 21, height 6\' 2"  (1.88 m), weight 77.6 kg (171 lb), SpO2 93 %.  GENERAL:  84 y.o.-year-old patient lying in the bed with no acute distress.  EYES: Pupils equal, round, reactive to light and accommodation. No scleral icterus. Extraocular muscles intact.  HEENT: Head atraumatic, normocephalic. Oropharynx and nasopharynx clear.  NECK:  Supple, no jugular venous distention. No thyroid enlargement, no tenderness.  LUNGS: Normal breath sounds bilaterally, no wheezing, rales,rhonchi or crepitation. No use of accessory muscles of respiration.  CARDIOVASCULAR: S1, S2 normal. No murmurs, rubs, or gallops.  ABDOMEN: Soft, nontender, nondistended. Bowel sounds present. No organomegaly or mass.  Foley in place with hematuria noted EXTREMITIES: No pedal edema, cyanosis, or clubbing.  NEUROLOGIC: Cranial nerves II through XII are intact. Muscle strength 5/5 in all extremities. Sensation intact. Gait not checked.  PSYCHIATRIC: The patient is alert and oriented x 3.  SKIN: No obvious rash, lesion, or ulcer.   LABORATORY PANEL:   CBC Recent Labs  Lab 09/25/17 2108  WBC 13.6*  HGB 13.1  HCT 39.2*  PLT 267  MCV 96.0  MCH 32.1  MCHC 33.4  RDW 14.9*    ------------------------------------------------------------------------------------------------------------------  Chemistries  Recent Labs  Lab 09/25/17 2108  NA 138  K 4.8  CL 102  CO2 21*  GLUCOSE 137*  BUN 37*  CREATININE 2.47*  CALCIUM 10.3   ------------------------------------------------------------------------------------------------------------------ estimated creatinine clearance is 22.3 mL/min (A) (by C-G formula based on SCr of 2.47 mg/dL (H)). ------------------------------------------------------------------------------------------------------------------ No results for input(s): TSH, T4TOTAL, T3FREE, THYROIDAB in the last 72 hours.  Invalid input(s): FREET3   Coagulation profile No results for input(s): INR, PROTIME in the last 168 hours. ------------------------------------------------------------------------------------------------------------------- No results for input(s): DDIMER in the last 72 hours. -------------------------------------------------------------------------------------------------------------------  Cardiac Enzymes No results for input(s): CKMB, TROPONINI, MYOGLOBIN in the last 168 hours.  Invalid input(s): CK ------------------------------------------------------------------------------------------------------------------ Invalid input(s): POCBNP  ---------------------------------------------------------------------------------------------------------------  Urinalysis    Component Value Date/Time   COLORURINE YELLOW (A) 05/30/2017 2000   APPEARANCEUR CLEAR (A) 05/30/2017 2000   LABSPEC 1.010 05/30/2017 2000   PHURINE 7.0 05/30/2017 2000   GLUCOSEU NEGATIVE 05/30/2017 2000   HGBUR NEGATIVE 05/30/2017 [place] NEGATIVE 05/30/2017 2000  BILIRUBINUR negative 02/15/2016 1113   KETONESUR NEGATIVE 05/30/2017 2000   PROTEINUR NEGATIVE 05/30/2017 2000   UROBILINOGEN 0.2 02/15/2016 1113   UROBILINOGEN 0.2 10/09/2008 1850    NITRITE NEGATIVE 05/30/2017 2000   LEUKOCYTESUR NEGATIVE 05/30/2017 2000     RADIOLOGY: Ct Abdomen Pelvis Wo Contrast  Result Date: 09/26/2017 CLINICAL DATA:  Abdominal distension. Suprapubic pain and hematuria. Recent treatment for UTI. History of prostate cancer. EXAM: CT ABDOMEN AND PELVIS WITHOUT CONTRAST TECHNIQUE: Multidetector CT imaging of the abdomen and pelvis was performed following the standard protocol without IV contrast. COMPARISON:  CT abdomen 11/14/2013 FINDINGS: Lower chest: Tiny lingular nodule is unchanged. Prior left lower lobe pulmonary nodule is not seen, may be obscured by motion. Partially calcified nodule in the right lower lobe is unchanged. Imaging stability over the course of 4 years indicates benign process, no further follow-up is needed. Hepatobiliary: No focal hepatic lesion allowing for lack contrast. Gallbladder physiologically distended, no calcified stone. No biliary dilatation. Pancreas: Fatty atrophy.  No ductal dilatation or inflammation. Spleen: Normal in size without focal abnormality. Adrenals/Urinary Tract: Mild bilateral adrenal thickening without dominant nodule. Mild bilateral hydroureteronephrosis. Innumerable bilateral renal cysts. Hyperdense lesion in the mid right kidney measures 12 mm, unchanged from prior and likely a hyperdense cyst. Urinary bladder is distended with large amount of heterogeneous high density material. There is a Foley catheter in place. Tiny right lateral bladder diverticulum. Mild perivesicular stranding. Stomach/Bowel: Stomach physiologically distended. No bowel wall thickening, inflammation or obstruction. Mild diverticulosis of the descending and sigmoid colon without diverticulitis. Normal appendix. Vascular/Lymphatic: Aortic atherosclerosis and tortuosity. No aneurysm. No enlarged abdominal or pelvic lymph nodes. Reproductive: Brachytherapy seeds in the prostate gland. Other: Fat in both inguinal canals with bilateral inguinal  testis. No ascites. No free air. Tiny fat containing umbilical hernia. Musculoskeletal: Degenerative change throughout the lumbar spine and the right hip. No focal bone lesion. IMPRESSION: 1. Distended urinary bladder filled with high-density material, likely blood clot in the setting of hematuria. Large bladder mass is not entirely excluded based on imaging findings alone. This could be further assessed with cystoscopy. 2. Mild bilateral hydroureteronephrosis. 3. Innumerable bilateral renal cysts including a hyperdense cyst in the right kidney. 4. Colonic diverticulosis. 5.  Aortic Atherosclerosis (ICD10-I70.0). Electronically Signed   By: Jeb Levering M.D.   On: 09/26/2017 02:24    EKG: Orders placed or performed during the hospital encounter of 09/25/17  . ED EKG  . ED EKG    IMPRESSION AND PLAN: 1 acute urinary obstructio  Secondary to bladder clot/acute hematuria CT abdomen noted for distended bladder with probable clot/mass cannot be excluded/bilateral mild hydronephrosis Admit to regular nursing floor bed, continue three-way bladder irrigation, urology to see, strict I&O monitoring, BMP/CBC daily, avoid antiplatelet/anticoagulants/NSAIDs, adult pain protocol, and continue close medical monitoring  2 AKI w/ chronic kidney disease stage III Exacerbated by above BL Cr 1.8 Avoid nephrotoxic agents, and all other plans as stated above  3 acute on chronic neurogenic syncope Secondary to worsening Parkinson's disease, chronic orthostatic hypotension Continue Midorine, fall precautions Workup from October 2018 noted for unimpressive carotid Dopplers, echocardiogram at that time noted for ejection fraction 20-25%  4 chronic GERD without esophagitis PPI daily  5 chronic Parkinson's disease Stable Continue Sinemet  All the records are reviewed and case discussed with ED provider. Management plans discussed with the patient, family and they are in agreement.  CODE STATUS: Code  Status History    Date Active Date Inactive Code Status Order ID  Comments User Context   05/29/2017 14:25 05/31/2017 19:57 Full Code [id number]  Demetrios Loll, MD Inpatient   06/24/2016 15:54 06/28/2016 15:41 Full Code [id number]  Nicholes Mango, MD Inpatient   06/24/2016 03:36 06/24/2016 15:54 DNR [id number]  Saundra Shelling, MD Inpatient   03/10/2016 08:37 03/10/2016 14:45 DNR [id number]  [name], MD Inpatient   03/08/2016 16:02 03/08/2016 16:21 Full Code [id number]  Nicholes Mango, MD Inpatient    Advance Directive Documentation     Most Recent Value  Type of Advance Directive  Living will, Healthcare Power of Attorney  Pre-existing out of facility DNR order (yellow form or pink MOST form)  No data  "MOST" Form in Place?  No data       TOTAL TIME TAKING CARE OF THIS PATIENT: 45 minutes.    Keith Woods M.D on 09/26/2017   Between 7am to 6pm - Pager - [phone]  After 6pm go to [email or web address] - password [email or web address] [facility]  Office  [phone]  CC: Primary care physician; [name], MD   Note: This dictation was prepared with Dragon dictation along with smaller phrase technology. Any transcriptional errors that result from this process are unintentional.

## 2017-09-26 NOTE — Progress Notes (Signed)
Changed gown and linen on patient.

## 2017-09-26 NOTE — Anesthesia Preprocedure Evaluation (Signed)
Anesthesia Evaluation  Patient identified by MRN, date of birth, ID band Patient awake    Reviewed: Allergy & Precautions, H&P , NPO status , Patient's Chart, lab work & pertinent test results  History of Anesthesia Complications Negative for: history of anesthetic complications  Airway Mallampati: III  TM Distance: >3 FB Neck ROM: limited    Dental  (+) Chipped, Poor Dentition, Missing   Pulmonary neg shortness of breath, former smoker,           Cardiovascular hypertension, (-) angina+ CAD and + Past MI  + dysrhythmias      Neuro/Psych PSYCHIATRIC DISORDERS Depression CVA    GI/Hepatic GERD  Medicated and Controlled,  Endo/Other  negative endocrine ROS  Renal/GU CRF and ARFRenal disease     Musculoskeletal   Abdominal   Peds  Hematology negative hematology ROS (+)   Anesthesia Other Findings Past Medical History: No date: Chronic kidney disease No date: Coronary artery disease     Comment:  NONOBSTRUCTIVE by cath 2008 No date: GERD (gastroesophageal reflux disease) No date: History of prostate cancer No date: History of transient ischemic attack (TIA) No date: Hyperlipidemia No date: Hypertension No date: LBBB (left bundle branch block) 02/2016: Stroke ([place])     Comment:  Hemorrhagic No date: Syncope and collapse  Past Surgical History: 08/28/2006: CARDIAC CATHETERIZATION     Comment:  EF 60% No date: TONSILLECTOMY 10/10/2008: TRANSTHORACIC ECHOCARDIOGRAM     Comment:  EF 55%  BMI    Body Mass Index:  21.96 kg/m      Reproductive/Obstetrics negative OB ROS                             Anesthesia Physical Anesthesia Plan  ASA: IV  Anesthesia Plan: General ETT   Post-op Pain Management:    Induction: Intravenous  PONV Risk Score and Plan: Ondansetron, Dexamethasone and Treatment may vary due to age or medical condition  Airway Management Planned: Oral  ETT  Additional Equipment:   Intra-op Plan:   Post-operative Plan: Extubation in OR and Possible Post-op intubation/ventilation  Informed Consent: I have reviewed the patients History and Physical, chart, labs and discussed the procedure including the risks, benefits and alternatives for the proposed anesthesia with the patient or authorized representative who has indicated his/her understanding and acceptance.   Dental Advisory Given  Plan Discussed with: Anesthesiologist, CRNA and Surgeon  Anesthesia Plan Comments: (Patient informed that they are higher risk for complications from anesthesia during this procedure due to their medical history.  Patient voiced understanding.  Patient consented for risks of anesthesia including but not limited to:  - adverse reactions to medications - damage to teeth, lips or other oral mucosa - sore throat or hoarseness - Damage to heart, brain, lungs or loss of life  Patient voiced understanding.)        Anesthesia Quick Evaluation

## 2017-09-27 LAB — BASIC METABOLIC PANEL
Anion gap: 12 (ref 5–15)
BUN: 52 mg/dL — AB (ref 6–20)
CALCIUM: 9.4 mg/dL (ref 8.9–10.3)
CHLORIDE: 101 mmol/L (ref 101–111)
CO2: 22 mmol/L (ref 22–32)
CREATININE: 3.31 mg/dL — AB (ref 0.61–1.24)
GFR calc Af Amer: 17 mL/min — ABNORMAL LOW (ref >60)
GFR calc non Af Amer: 15 mL/min — ABNORMAL LOW (ref >60)
Glucose, Bld: 120 mg/dL — ABNORMAL HIGH (ref 65–99)
Potassium: 4.7 mmol/L (ref 3.5–5.1)
Sodium: 135 mmol/L (ref 135–145)

## 2017-09-27 LAB — HEMOGLOBIN AND HEMATOCRIT, BLOOD
HCT: 27.2 % — ABNORMAL LOW (ref 40.0–52.0)
HEMOGLOBIN: 8.8 g/dL — AB (ref 13.0–18.0)

## 2017-09-27 MED ORDER — SODIUM CHLORIDE 0.45 % IV SOLN
INTRAVENOUS | Status: DC
  Administered 2017-09-27 – 2017-09-29 (×4): via INTRAVENOUS

## 2017-09-27 MED ORDER — TEMAZEPAM 7.5 MG PO CAPS
7.5000 mg | ORAL_CAPSULE | Freq: Every day | ORAL | Status: DC
  Administered 2017-09-27 – 2017-09-28 (×2): 7.5 mg via ORAL
  Filled 2017-09-27 (×2): qty 1

## 2017-09-27 NOTE — Progress Notes (Signed)
MD notified of confusion and inability to redirect pt. MD ordered Restoril Qhs. IV wrapped for safety. Daughter at bedside.

## 2017-09-27 NOTE — Progress Notes (Signed)
[place] at [place] NAME: Keith Woods    MR#:  [id number]  DATE OF BIRTH:  [birth year]  SUBJECTIVE:  CHIEF COMPLAINT:   Chief Complaint  Patient presents with  . Near Syncope   Feels fatigued.  No shortness of breath or chest pain.  Significant improvement in hematuria.  Still on continuous bladder irrigation.  Today [birth year] his birthday.  REVIEW OF SYSTEMS:    Review of Systems  Constitutional: Positive for malaise/fatigue. Negative for chills and fever.  HENT: Negative for sore throat.   Eyes: Negative for blurred vision, double vision and pain.  Respiratory: Negative for cough, hemoptysis, shortness of breath and wheezing.   Cardiovascular: Negative for chest pain, palpitations, orthopnea and leg swelling.  Gastrointestinal: Negative for abdominal pain, constipation, diarrhea, heartburn, nausea and vomiting.  Genitourinary: Positive for hematuria. Negative for dysuria.  Musculoskeletal: Negative for back pain and joint pain.  Skin: Negative for rash.  Neurological: Positive for weakness. Negative for sensory change, speech change, focal weakness and headaches.  Endo/Heme/Allergies: Does not bruise/bleed easily.  Psychiatric/Behavioral: Negative for depression. The patient is not nervous/anxious.     DRUG ALLERGIES:   Allergies  Allergen Reactions  . Lipitor [Atorvastatin Calcium]     Leg pain  . Nsaids   . Statins     Leg pain    VITALS:  Blood pressure (!) 95/57, pulse 84, temperature 98.1 F (36.7 C), temperature source Oral, resp. rate 19, height 6\' 2"  (1.88 m), weight 77.6 kg (171 lb), SpO2 98 %.  PHYSICAL EXAMINATION:   Physical Exam  GENERAL:  84 y.o.-year-old patient lying in the bed with no acute distress.  EYES: Pupils equal, round, reactive to light and accommodation. No scleral icterus. Extraocular muscles intact.  HEENT: Head atraumatic, normocephalic. Oropharynx and nasopharynx clear.  NECK:  Supple, no  jugular venous distention. No thyroid enlargement, no tenderness.  LUNGS: Normal breath sounds bilaterally, no wheezing, rales, rhonchi. No use of accessory muscles of respiration.  CARDIOVASCULAR: S1, S2 normal. No murmurs, rubs, or gallops.  ABDOMEN: Soft, nontender, nondistended. Bowel sounds present. No organomegaly or mass.  EXTREMITIES: No cyanosis, clubbing or edema b/l.    NEUROLOGIC: Cranial nerves II through XII are intact. No focal Motor or sensory deficits b/l.   PSYCHIATRIC: The patient is alert and oriented x 3.  SKIN: No obvious rash, lesion, or ulcer.   LABORATORY PANEL:   CBC Recent Labs  Lab 09/26/17 0505 09/27/17 0751  WBC 12.7*  --   HGB 11.2* 8.8*  HCT 32.7* 27.2*  PLT 228  --    ------------------------------------------------------------------------------------------------------------------ Chemistries  Recent Labs  Lab 09/27/17 0751  NA 135  K 4.7  CL 101  CO2 22  GLUCOSE 120*  BUN 52*  CREATININE 3.31*  CALCIUM 9.4   ------------------------------------------------------------------------------------------------------------------  Cardiac Enzymes Recent Labs  Lab 09/26/17 1049  TROPONINI 0.14*   ------------------------------------------------------------------------------------------------------------------  RADIOLOGY:  Ct Abdomen Pelvis Wo Contrast  Result Date: 09/26/2017 CLINICAL DATA:  Abdominal distension. Suprapubic pain and hematuria. Recent treatment for UTI. History of prostate cancer. EXAM: CT ABDOMEN AND PELVIS WITHOUT CONTRAST TECHNIQUE: Multidetector CT imaging of the abdomen and pelvis was performed following the standard protocol without IV contrast. COMPARISON:  CT abdomen 11/14/2013 FINDINGS: Lower chest: Tiny lingular nodule is unchanged. Prior left lower lobe pulmonary nodule is not seen, may be obscured by motion. Partially calcified nodule in the right lower lobe is unchanged. Imaging stability over the course of 4 years  indicates  benign process, no further follow-up is needed. Hepatobiliary: No focal hepatic lesion allowing for lack contrast. Gallbladder physiologically distended, no calcified stone. No biliary dilatation. Pancreas: Fatty atrophy.  No ductal dilatation or inflammation. Spleen: Normal in size without focal abnormality. Adrenals/Urinary Tract: Mild bilateral adrenal thickening without dominant nodule. Mild bilateral hydroureteronephrosis. Innumerable bilateral renal cysts. Hyperdense lesion in the mid right kidney measures 12 mm, unchanged from prior and likely a hyperdense cyst. Urinary bladder is distended with large amount of heterogeneous high density material. There is a Foley catheter in place. Tiny right lateral bladder diverticulum. Mild perivesicular stranding. Stomach/Bowel: Stomach physiologically distended. No bowel wall thickening, inflammation or obstruction. Mild diverticulosis of the descending and sigmoid colon without diverticulitis. Normal appendix. Vascular/Lymphatic: Aortic atherosclerosis and tortuosity. No aneurysm. No enlarged abdominal or pelvic lymph nodes. Reproductive: Brachytherapy seeds in the prostate gland. Other: Fat in both inguinal canals with bilateral inguinal testis. No ascites. No free air. Tiny fat containing umbilical hernia. Musculoskeletal: Degenerative change throughout the lumbar spine and the right hip. No focal bone lesion. IMPRESSION: 1. Distended urinary bladder filled with high-density material, likely blood clot in the setting of hematuria. Large bladder mass is not entirely excluded based on imaging findings alone. This could be further assessed with cystoscopy. 2. Mild bilateral hydroureteronephrosis. 3. Innumerable bilateral renal cysts including a hyperdense cyst in the right kidney. 4. Colonic diverticulosis. 5.  Aortic Atherosclerosis (ICD10-I70.0). Electronically Signed   By: Jeb Levering M.D.   On: 09/26/2017 02:24     ASSESSMENT AND PLAN:   *Acute  urinary retention secondary to large bladder clot.  Status post cystoscopy with clot evacuation and fulguration Discussed with Dr. Gloriann Loan of urology.  Continue continuous bladder irrigation 1 more day. Radiation cystitis  *Acute blood loss anemia secondary to hematuria.  Hemoglobin at 8.8.  No need for transfusion.  Will need iron supplements at discharge. Baseline of 12  Discussed with him that he would be fatigued and may be more short of breath at discharge due to new onset anemia.  *Acute kidney injury over CKD stage III secondary to post renal obstruction and also ATN.  We will increase fluids to 75 mL/h.  Monitor input and output. Close monitoring for fluid overload with patient ejection fraction being 30%.  *Orthostatic syncope Secondary to worsening Parkinson's disease, chronic orthostatic hypotension Continue Midorine, fall precautions Workup from October 2018 noted for unimpressive carotid Dopplers, echocardiogram at that time noted for ejection fraction 20-25% * chronic GERD without esophagitis PPI daily  * chronic Parkinson's disease Stable Continue Sinemet    All the records are reviewed and case discussed with Care Management/Social Workerr. Management plans discussed with the patient, family and they are in agreement.  CODE STATUS: FULL CODE  DVT Prophylaxis: SCDs  TOTAL TIME TAKING CARE OF THIS PATIENT: 35 minutes.   POSSIBLE D/C IN 1-2 DAYS, DEPENDING ON CLINICAL CONDITION.  Leia Alf Burley Kopka M.D on 09/27/2017 at 1:47 PM  Between 7am to 6pm - Pager - [phone]  After 6pm go to [email or web address] - password [email or web address] [facility]  [facility]  Office  [phone]  CC: Primary care physician; [name], MD  Note: This dictation was prepared with Dragon dictation along with smaller phrase technology. Any transcriptional errors that result from this process are unintentional.

## 2017-09-27 NOTE — Progress Notes (Signed)
Urology Inpatient Progress Report  Gross hematuria [R31.0] Acute urinary retention [R33.8] Syncope, unspecified syncope type [R55]  Procedure(s): TRANSURETHRAL RESECTION OF BLADDER TUMOR (TURBT) possible CYSTOSCOPY WITH STENT PLACEMENT POSSIBLE FULGURATION EVACUATION HEMATOMA RETROGRADE PYELOGRAM TRANSURETHRAL RESECTION OF THE PROSTATE (TURP)  1 Day Post-Op   Intv/Subj: No acute events overnight. Patient is without complaint. Hemoglobin down to 8.8 from 11.2 yesterday. Renal function worsening with a creatinine of 3.31 from 2.62.  Patient continues to have intermittent episodes of hypotension.  He has no complaints today.  No pain.  Urine appears to be clearing up.  Output was pretty clear on a slow drip.   Active Problems:   Acute urinary obstruction  Current Facility-Administered Medications  Medication Dose Route Frequency Provider Last Rate Last Dose  . acetaminophen (TYLENOL) tablet 650 mg  650 mg Oral Q6H PRN Salary, Montell D, MD       Or  . acetaminophen (TYLENOL) suppository 650 mg  650 mg Rectal Q6H PRN Salary, Montell D, MD      . acidophilus (RISAQUAD) capsule 1 capsule  1 capsule Oral Daily Salary, Montell D, MD   1 capsule at 09/27/17 1115  . allopurinol (ZYLOPRIM) tablet 200 mg  200 mg Oral Daily Salary, Montell D, MD   200 mg at 09/27/17 1115  . carbidopa-levodopa (SINEMET IR) 25-100 MG per tablet immediate release 1 tablet  1 tablet Oral BID Loney Hering D, MD   1 tablet at 09/27/17 1115  . citalopram (CELEXA) tablet 10 mg  10 mg Oral BID Loney Hering D, MD   10 mg at 09/27/17 1115  . colchicine tablet 0.6 mg  0.6 mg Oral Daily Salary, Montell D, MD   0.6 mg at 09/27/17 1115  . dextrose 5 % and 0.2 % NaCl infusion   Intravenous Continuous Salary, Montell D, MD 50 mL/hr at 09/26/17 2337    . docusate sodium (COLACE) capsule 100 mg  100 mg Oral BID Salary, Holly Bodily D, MD   100 mg at 09/27/17 1115  . fentaNYL (SUBLIMAZE) injection 50 mcg  50 mcg Intravenous  Q1H PRN Merlyn Lot, MD   50 mcg at 09/26/17 0131  . HYDROcodone-acetaminophen (NORCO/VICODIN) 5-325 MG per tablet 1-2 tablet  1-2 tablet Oral Q4H PRN Gorden Harms, MD   2 tablet at 09/26/17 2148  . midodrine (PROAMATINE) tablet 10 mg  10 mg Oral TID WC Salary, Montell D, MD   10 mg at 09/27/17 1115  . multivitamin with minerals tablet 1 tablet  1 tablet Oral Daily Salary, Montell D, MD   1 tablet at 09/27/17 1117  . nitroGLYCERIN (NITROSTAT) SL tablet 0.4 mg  0.4 mg Sublingual Q5 min PRN Salary, Montell D, MD      . ondansetron (ZOFRAN) tablet 4 mg  4 mg Oral Q6H PRN Salary, Montell D, MD       Or  . ondansetron (ZOFRAN) injection 4 mg  4 mg Intravenous Q6H PRN Salary, Montell D, MD   4 mg at 09/26/17 0700  . oxybutynin (DITROPAN) tablet 5 mg  5 mg Oral TID Loney Hering, MD   5 mg at 09/27/17 1116  . polyethylene glycol (MIRALAX / GLYCOLAX) packet 17 g  17 g Oral Daily PRN Salary, Montell D, MD      . sodium bicarbonate tablet 650 mg  650 mg Oral BID Salary, Montell D, MD   650 mg at 09/27/17 1116  . [START ON 09/28/2017] Vitamin D (Ergocalciferol) (DRISDOL) capsule 50,000 Units  50,000 Units  Oral Q7 days Loney Hering D, MD         Objective: Vital: Vitals:   09/26/17 1326 09/26/17 1945 09/27/17 0529 09/27/17 1109  BP: 110/62 (!) 143/93 (!) 93/52 (!) 107/56  Pulse: 91 100 86 79  Resp: 18 16 20    Temp: 98.6 F (37 C) 97.6 F (36.4 C) 98.2 F (36.8 C)   TempSrc: Oral Oral Oral   SpO2: 92% 100% 97%   Weight:      Height:       I/Os: I/O last 3 completed shifts: In: 72620 [P.O.:240; I.V.:1264; BTDHR:41638] Out: 45364 [WOEHO:12248; Blood:5]  Physical Exam:  General: Patient is in no apparent distress Lungs: Normal respiratory effort, chest expands symmetrically. GI: The abdomen is soft and nontender without mass. Foley: 74 French 3-way Foley catheter in place with CBI on a slow drip.  Output was clear to light pink. Ext: lower extremities symmetric  Lab  Results: Recent Labs    09/25/17 2108 09/26/17 0505 09/27/17 0751  WBC 13.6* 12.7*  --   HGB 13.1 11.2* 8.8*  HCT 39.2* 32.7* 27.2*   Recent Labs    09/25/17 2108 09/26/17 0505 09/27/17 0751  NA 138 136 135  K 4.8 4.9 4.7  CL 102 103 101  CO2 21* 19* 22  GLUCOSE 137* 204* 120*  BUN 37* 44* 52*  CREATININE 2.47* 2.62* 3.31*  CALCIUM 10.3 9.6 9.4   No results for input(s): LABPT, INR in the last 72 hours. No results for input(s): LABURIN in the last 72 hours. Results for orders placed or performed during the hospital encounter of 10/10/08  Urine culture     Status: None   Collection Time: 10/09/08  6:50 PM  Result Value Ref Range Status   Specimen Description URINE, RANDOM  Final   Special Requests GN:OIBBC ON 030210 @0227   Final   Colony Count NO GROWTH  Final   Culture NO GROWTH  Final   Report Status 10/11/2008 FINAL  Final  Culture, blood (routine x 2)     Status: None   Collection Time: 10/10/08  2:35 AM  Result Value Ref Range Status   Specimen Description BLOOD RIGHT HAND  Final   Special Requests BOTTLES DRAWN AEROBIC AND ANAEROBIC 10CC EACH  Final   Culture NO GROWTH 5 DAYS  Final   Report Status 10/16/2008 FINAL  Final  Culture, blood (routine x 2)     Status: None   Collection Time: 10/10/08  2:45 AM  Result Value Ref Range Status   Specimen Description BLOOD RIGHT HAND  Final   Special Requests BOTTLES DRAWN AEROBIC AND ANAEROBIC 5CC  Final   Culture NO GROWTH 5 DAYS  Final   Report Status 10/16/2008 FINAL  Final  Clostridium difficile EIA     Status: None   Collection Time: 10/10/08  4:06 PM  Result Value Ref Range Status   Specimen Description STOOL  Final   Special Requests IMMUNE:NORM  Final   C difficile Toxins A+B, EIA NEGATIVE  Final   Report Status 10/11/2008 FINAL  Final  Fecal lactoferrin     Status: None   Collection Time: 10/10/08  4:06 PM  Result Value Ref Range Status   Specimen Description STOOL  Final   Special Requests NONE  Final    Fecal Lactoferrin POSITIVE  Final   Report Status 10/11/2008 FINAL  Final  Stool culture     Status: None   Collection Time: 10/10/08  4:06 PM  Result Value Ref  Range Status   Specimen Description STOOL  Final   Special Requests IMMUNE:NORM  Final   Culture   Final    NO SALMONELLA, SHIGELLA, CAMPYLOBACTER, OR YERSINIA ISOLATED   Report Status 10/14/2008 FINAL  Final  Stool culture     Status: None   Collection Time: 10/12/08  6:47 PM  Result Value Ref Range Status   Specimen Description STOOL  Final   Special Requests NONE  Final   Culture   Final    NO SALMONELLA, SHIGELLA, CAMPYLOBACTER, OR YERSINIA ISOLATED   Report Status 10/16/2008 FINAL  Final    Studies/Results: Ct Abdomen Pelvis Wo Contrast  Result Date: 09/26/2017 CLINICAL DATA:  Abdominal distension. Suprapubic pain and hematuria. Recent treatment for UTI. History of prostate cancer. EXAM: CT ABDOMEN AND PELVIS WITHOUT CONTRAST TECHNIQUE: Multidetector CT imaging of the abdomen and pelvis was performed following the standard protocol without IV contrast. COMPARISON:  CT abdomen 11/14/2013 FINDINGS: Lower chest: Tiny lingular nodule is unchanged. Prior left lower lobe pulmonary nodule is not seen, may be obscured by motion. Partially calcified nodule in the right lower lobe is unchanged. Imaging stability over the course of 4 years indicates benign process, no further follow-up is needed. Hepatobiliary: No focal hepatic lesion allowing for lack contrast. Gallbladder physiologically distended, no calcified stone. No biliary dilatation. Pancreas: Fatty atrophy.  No ductal dilatation or inflammation. Spleen: Normal in size without focal abnormality. Adrenals/Urinary Tract: Mild bilateral adrenal thickening without dominant nodule. Mild bilateral hydroureteronephrosis. Innumerable bilateral renal cysts. Hyperdense lesion in the mid right kidney measures 12 mm, unchanged from prior and likely a hyperdense cyst. Urinary bladder is  distended with large amount of heterogeneous high density material. There is a Foley catheter in place. Tiny right lateral bladder diverticulum. Mild perivesicular stranding. Stomach/Bowel: Stomach physiologically distended. No bowel wall thickening, inflammation or obstruction. Mild diverticulosis of the descending and sigmoid colon without diverticulitis. Normal appendix. Vascular/Lymphatic: Aortic atherosclerosis and tortuosity. No aneurysm. No enlarged abdominal or pelvic lymph nodes. Reproductive: Brachytherapy seeds in the prostate gland. Other: Fat in both inguinal canals with bilateral inguinal testis. No ascites. No free air. Tiny fat containing umbilical hernia. Musculoskeletal: Degenerative change throughout the lumbar spine and the right hip. No focal bone lesion. IMPRESSION: 1. Distended urinary bladder filled with high-density material, likely blood clot in the setting of hematuria. Large bladder mass is not entirely excluded based on imaging findings alone. This could be further assessed with cystoscopy. 2. Mild bilateral hydroureteronephrosis. 3. Innumerable bilateral renal cysts including a hyperdense cyst in the right kidney. 4. Colonic diverticulosis. 5.  Aortic Atherosclerosis (ICD10-I70.0). Electronically Signed   By: Jeb Levering M.D.   On: 09/26/2017 02:24    Assessment: 1.  Gross hematuria--likely secondary to radiation cystitis.  Hematuria looks like it is improving.  He does have acute blood loss anemia down to a hemoglobin of 8.8 from 13.1 at admission.  If he continues to have intermittent hypotension and tachycardia, I would consider 1 unit of PRBC.  Can also consider fluid bolus.  Hopefully if the urine remains clear, he may be able to undergo a voiding trial tomorrow.  2.  Mild bilateral hydronephrosis--most likely etiology was urinary retention secondary to clot retention.  I did not see any gross evidence of mass within the bladder that could be causing the obstruction and  there were no stones on CT.  Consider repeat renal ultrasound after a period of decompression or if kidney function continues to worsen to rule out worsening hydronephrosis.  The  3.  Acute renal insufficiency on top of chronic kidney disease--suspect this is secondary to ATN.  He did have multiple episodes of hypotension.  If it were secondary to urinary retention, I would have expected his creatinine to improve today.  Recommend nephrology consult and repeat renal ultrasound if it continues to worsen.   Procedure(s): Cystoscopy with clot evacuation and fulguration with limited transurethral resection of the prostate      Link Snuffer, MD Urology 09/27/2017, 11:53 AM

## 2017-09-27 NOTE — Progress Notes (Signed)
2 yellow colored rings were given back to the pt. One had a cross on it and the other was a yellow colored band.

## 2017-09-28 ENCOUNTER — Inpatient Hospital Stay: Payer: Medicare Other

## 2017-09-28 ENCOUNTER — Encounter: Payer: Self-pay | Admitting: Urology

## 2017-09-28 DIAGNOSIS — R338 Other retention of urine: Secondary | ICD-10-CM

## 2017-09-28 DIAGNOSIS — R310 Gross hematuria: Secondary | ICD-10-CM

## 2017-09-28 DIAGNOSIS — R55 Syncope and collapse: Secondary | ICD-10-CM

## 2017-09-28 LAB — HEMOGLOBIN AND HEMATOCRIT, BLOOD
HCT: 25.7 % — ABNORMAL LOW (ref 40.0–52.0)
Hemoglobin: 8.7 g/dL — ABNORMAL LOW (ref 13.0–18.0)

## 2017-09-28 LAB — BASIC METABOLIC PANEL
ANION GAP: 15 (ref 5–15)
BUN: 65 mg/dL — ABNORMAL HIGH (ref 6–20)
CHLORIDE: 99 mmol/L — AB (ref 101–111)
CO2: 18 mmol/L — AB (ref 22–32)
CREATININE: 2.81 mg/dL — AB (ref 0.61–1.24)
Calcium: 9.4 mg/dL (ref 8.9–10.3)
GFR calc non Af Amer: 18 mL/min — ABNORMAL LOW (ref >60)
GFR, EST AFRICAN AMERICAN: 21 mL/min — AB (ref >60)
Glucose, Bld: 137 mg/dL — ABNORMAL HIGH (ref 65–99)
POTASSIUM: 4.9 mmol/L (ref 3.5–5.1)
SODIUM: 132 mmol/L — AB (ref 135–145)

## 2017-09-28 LAB — MAGNESIUM: MAGNESIUM: 2 mg/dL (ref 1.7–2.4)

## 2017-09-28 LAB — TSH: TSH: 2.873 u[IU]/mL (ref 0.350–4.500)

## 2017-09-28 MED ORDER — HALOPERIDOL LACTATE 5 MG/ML IJ SOLN
0.5000 mg | Freq: Three times a day (TID) | INTRAMUSCULAR | Status: DC | PRN
  Filled 2017-09-28: qty 0.1

## 2017-09-28 MED ORDER — SENNA 8.6 MG PO TABS: 1.0000 | ORAL_TABLET | Freq: Every day | ORAL | Status: DC

## 2017-09-28 NOTE — Progress Notes (Signed)
[facility] NAME: Keith Woods    MR#:  [id number]  DATE OF BIRTH:  Apr 06, 1926  SUBJECTIVE:  CHIEF COMPLAINT:   Chief Complaint  Patient presents with  . Near Syncope   Feels fatigued. More sleepy and lethargic per family. visual hallucinations. dter reports pt had not slept for 2.5 days  REVIEW OF SYSTEMS:    Review of Systems  Constitutional: Positive for malaise/fatigue. Negative for chills and fever.  HENT: Negative for sore throat.   Eyes: Negative for blurred vision, double vision and pain.  Respiratory: Negative for cough, hemoptysis, shortness of breath and wheezing.   Cardiovascular: Negative for chest pain, palpitations, orthopnea and leg swelling.  Gastrointestinal: Negative for abdominal pain, constipation, diarrhea, heartburn, nausea and vomiting.  Genitourinary: Negative for dysuria.  Musculoskeletal: Negative for back pain and joint pain.  Skin: Negative for rash.  Neurological: Positive for tremors and weakness. Negative for sensory change, speech change, focal weakness and headaches.  Endo/Heme/Allergies: Does not bruise/bleed easily.  Psychiatric/Behavioral: Negative for depression. The patient is not nervous/anxious.     DRUG ALLERGIES:   Allergies  Allergen Reactions  . Lipitor [Atorvastatin Calcium]     Leg pain  . Nsaids   . Statins     Leg pain    VITALS:  Blood pressure 99/61, pulse (!) 108, temperature 97.7 F (36.5 C), temperature source Oral, resp. rate 19, height 6\' 2"  (1.88 m), weight 77.6 kg (171 lb), SpO2 93 %.  PHYSICAL EXAMINATION:   Physical Exam  GENERAL:  84 y.o.-year-old patient lying in the bed with no acute distress.  EYES: Pupils equal, round, reactive to light and accommodation. No scleral icterus. Extraocular muscles intact.  HEENT: Head atraumatic, normocephalic. Oropharynx and nasopharynx clear.  NECK:  Supple, no jugular venous distention. No thyroid enlargement, no  tenderness.  LUNGS: Normal breath sounds bilaterally, no wheezing, rales, rhonchi. No use of accessory muscles of respiration.  CARDIOVASCULAR: S1, S2 normal. No murmurs, rubs, or gallops.  ABDOMEN: Soft, nontender, nondistended. Bowel sounds present. No organomegaly or mass. Foley draining clear urine EXTREMITIES: No cyanosis, clubbing or edema b/l.    NEUROLOGIC: no focal deficit. Moves all extremities spot  PSYCHIATRIC: lethrgic  SKIN: No obvious rash, lesion, or ulcer.   LABORATORY PANEL:   CBC Recent Labs  Lab 09/26/17 0505  09/28/17 0856  WBC 12.7*  --   --   HGB 11.2*   < > 8.7*  HCT 32.7*   < > 25.7*  PLT 228  --   --    < > = values in this interval not displayed.   ------------------------------------------------------------------------------------------------------------------ Chemistries  Recent Labs  Lab 09/28/17 0856  NA 132*  K 4.9  CL 99*  CO2 18*  GLUCOSE 137*  BUN 65*  CREATININE 2.81*  CALCIUM 9.4  MG 2.0   ------------------------------------------------------------------------------------------------------------------  Cardiac Enzymes Recent Labs  Lab 09/26/17 1049  TROPONINI 0.14*   ------------------------------------------------------------------------------------------------------------------  RADIOLOGY:  Ct Head Wo Contrast  Result Date: 09/28/2017 CLINICAL DATA:  84 year old male with acute confusion and altered mental status. EXAM: CT HEAD WITHOUT CONTRAST TECHNIQUE: Contiguous axial images were obtained from the base of the skull through the vertex without intravenous contrast. COMPARISON:  05/30/2017 CT and prior exams. FINDINGS: Brain: No evidence of acute infarction, hemorrhage, hydrocephalus, extra-axial collection or mass lesion/mass effect. Moderate-severe chronic small-vessel white matter ischemic changes and remote basal ganglia and thalamic infarcts again noted. Vascular: Atherosclerotic calcifications noted. Skull: Normal.  Negative  for fracture or focal lesion. Sinuses/Orbits: No acute finding. Other: None. IMPRESSION: 1. No evidence of acute intracranial abnormality 2. Moderate to severe chronic small-vessel white matter ischemic changes and remote infarcts as described. Electronically Signed   By: Margarette Canada M.D.   On: 09/28/2017 15:16     ASSESSMENT AND PLAN:  Keith Woods  is a 84 y.o. 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  Status post cystoscopy with clot evacuation and fulguration POD #2 -s/p CBI---now stopped -h/o Radiation cystitis  *Acute blood loss anemia secondary to hematuria. -  Hemoglobin at 8.8.  No need for transfusion.  Will need iron supplements at discharge. -Baseline of 12  *Acute kidney injury over CKD stage III secondary to post renal obstruction and also ATN.  - We will increase fluids to 75 mL/h.  Monitor input and output. Close monitoring for fluid overload with patient ejection fraction being 30%. -creat 2.12--2.47--2.62--3.31--2.81 -Baseline creat 2.09  *Orthostatic syncope -Secondary to worsening Parkinson's disease, chronic orthostatic hypotension -Continue Midorine, fall precautions -Workup from October 2018 noted for unimpressive carotid Dopplers, echocardiogram at that time noted for ejection fraction 20-25%  * chronic GERD without esophagitis PPI daily  * chronic Parkinson's disease Stable Continue Sinemet  D/w pt's wife and dter in the room. All questions answered  All the records are reviewed and case discussed with Care Management/Social Workerr. Management plans discussed with  the patient, family and they are in agreement.  CODE STATUS: FULL CODE  DVT Prophylaxis: SCDs  TOTAL TIME TAKING CARE OF THIS PATIENT: 25 minutes.   POSSIBLE D/C IN 1-2 DAYS, DEPENDING ON CLINICAL CONDITION.  Fritzi Mandes M.D on 09/28/2017 at 7:43 PM  Between 7am to 6pm - Pager - [phone]  After 6pm go to [email or web address] - password [email or web address] [facility]  [facility]  Office  [phone]  CC: Primary care physician; [name], MD  Note: This dictation was prepared with Dragon dictation along with smaller phrase technology. Any transcriptional errors that result from this process are unintentional.

## 2017-09-28 NOTE — Progress Notes (Signed)
MD aware of 7 beat run of [place]. Orders for Mag level to be drawn.

## 2017-09-28 NOTE — Progress Notes (Signed)
Urology Consult Follow Up  Subjective: Worsening confusion/delirium with underlying dementia overnight.  Agitated and disoriented.  Urine now clear with CBI off.  Hemoglobin stabilized.  Anti-infectives: Anti-infectives (From admission, onward)   Start     Dose/Rate Route Frequency Ordered Stop   09/26/17 0601  ceFAZolin (ANCEF) 2-4 GM/100ML-% IVPB    Comments:  Glenford Peers   : cabinet override      09/26/17 0601 09/26/17 1814      Current Facility-Administered Medications  Medication Dose Route Frequency Provider Last Rate Last Dose  . 0.45 % sodium chloride infusion   Intravenous Continuous Hillary Bow, MD 75 mL/hr at 09/28/17 0442    . acetaminophen (TYLENOL) tablet 650 mg  650 mg Oral Q6H PRN Salary, Montell D, MD       Or  . acetaminophen (TYLENOL) suppository 650 mg  650 mg Rectal Q6H PRN Salary, Montell D, MD      . acidophilus (RISAQUAD) capsule 1 capsule  1 capsule Oral Daily Salary, Montell D, MD   1 capsule at 09/27/17 1115  . allopurinol (ZYLOPRIM) tablet 200 mg  200 mg Oral Daily Salary, Montell D, MD   200 mg at 09/27/17 1115  . carbidopa-levodopa (SINEMET IR) 25-100 MG per tablet immediate release 1 tablet  1 tablet Oral BID Loney Hering D, MD   1 tablet at 09/27/17 2119  . citalopram (CELEXA) tablet 10 mg  10 mg Oral BID Loney Hering D, MD   10 mg at 09/27/17 2119  . colchicine tablet 0.6 mg  0.6 mg Oral Daily Salary, Montell D, MD   0.6 mg at 09/27/17 1115  . docusate sodium (COLACE) capsule 100 mg  100 mg Oral BID Loney Hering D, MD   100 mg at 09/27/17 2119  . fentaNYL (SUBLIMAZE) injection 50 mcg  50 mcg Intravenous Q1H PRN Merlyn Lot, MD   50 mcg at 09/26/17 0131  . HYDROcodone-acetaminophen (NORCO/VICODIN) 5-325 MG per tablet 1-2 tablet  1-2 tablet Oral Q4H PRN Gorden Harms, MD   2 tablet at 09/26/17 2148  . midodrine (PROAMATINE) tablet 10 mg  10 mg Oral TID WC Salary, Montell D, MD   10 mg at 09/27/17 1645  . multivitamin with minerals  tablet 1 tablet  1 tablet Oral Daily Salary, Montell D, MD   1 tablet at 09/27/17 1117  . nitroGLYCERIN (NITROSTAT) SL tablet 0.4 mg  0.4 mg Sublingual Q5 min PRN Salary, Montell D, MD      . ondansetron (ZOFRAN) tablet 4 mg  4 mg Oral Q6H PRN Salary, Montell D, MD       Or  . ondansetron (ZOFRAN) injection 4 mg  4 mg Intravenous Q6H PRN Salary, Montell D, MD   4 mg at 09/26/17 0700  . oxybutynin (DITROPAN) tablet 5 mg  5 mg Oral TID Loney Hering, MD   5 mg at 09/27/17 2119  . polyethylene glycol (MIRALAX / GLYCOLAX) packet 17 g  17 g Oral Daily PRN Salary, Montell D, MD      . sodium bicarbonate tablet 650 mg  650 mg Oral BID Loney Hering D, MD   650 mg at 09/27/17 2119  . temazepam (RESTORIL) capsule 7.5 mg  7.5 mg Oral QHS Salary, Montell D, MD   7.5 mg at 09/27/17 2119  . Vitamin D (Ergocalciferol) (DRISDOL) capsule 50,000 Units  50,000 Units Oral Q7 days Salary, Montell D, MD         Objective: Vital signs in last 24 hours:  Temp:  [97.5 F (36.4 C)-99.3 F (37.4 C)] 97.7 F (36.5 C) (02/18 0545) Pulse Rate:  [74-114] 105 (02/18 0810) Resp:  [13-19] 16 (02/18 0432) BP: (88-110)/(56-67) 92/61 (02/18 0810) SpO2:  [87 %-98 %] 93 % (02/18 0440)  Intake/Output from previous day: 02/17 0701 - 02/18 0700 In: 6414 [P.O.:360; I.V.:1254] Out: 8110 [Urine:8110] Intake/Output this shift: Total I/O In: 540 [I.V.:540] Out: 1200 [Urine:1200]   Physical Exam  Sleeping, snoring Family at bedside Abdomen soft, nontender, bladder nonpalpable Foley catheter in place, secured to left thigh.  Urine draining clear yellow urine without clots, CBI off.  Lab Results:  Recent Labs    09/25/17 2108 09/26/17 0505 09/27/17 0751 09/28/17 0856  WBC 13.6* 12.7*  --   --   HGB 13.1 11.2* 8.8* 8.7*  HCT 39.2* 32.7* 27.2* 25.7*  PLT 267 228  --   --    BMET Recent Labs    09/27/17 0751 09/28/17 0856  NA 135 132*  K 4.7 4.9  CL 101 99*  CO2 22 18*  GLUCOSE 120* 137*  BUN 52* 65*   CREATININE 3.31* 2.81*  CALCIUM 9.4 9.4    Assessment:  1.  Gross hematuria--likely secondary to radiation cystitis.  Hematuria looks like it is improving.  He does have acute blood loss anemia down to a hemoglobin of 8.8 from 13.1 at admission which has now stabilized and urine has cleared.  Somewhat hesitant to remove Foley catheter today given delirium, bed ridden, concerned that catheter would have to be replaced.  Plan for VT in AM if clinically improved.    2.  Mild bilateral hydronephrosis--most likely etiology was urinary retention secondary to clot retention.  If Cr continues to improve, will hold off on repeat imaging.    3.  Acute renal insufficiency on top of chronic kidney disease--suspect this is secondary to ATN/ clot retention.  Creatinine improving but not quite back to baseline which appears to be around 2.  Continue to follow.  Avoid nephrotoxic agents.      LOS: 2 days   Hollice Espy 09/28/2017

## 2017-09-28 NOTE — Progress Notes (Signed)
PT Cancellation Note  Patient Details Name: Keith Woods MRN: [id number] DOB: 01/17/1926   Cancelled Treatment:    Reason Eval/Treat Not Completed: Medical issues which prohibited therapy. Order received and chart reviewed. Per RN notes pt with 7 beat run of Vtach and Mag level to be drawn. Will attempt PT evaluation on later date/time as appropriate.  Lyndel Safe [name] PT, DPT   [name] 09/28/2017, 4:42 PM

## 2017-09-28 NOTE — Progress Notes (Signed)
Unable to give medications at this time due to pt being lethargic.

## 2017-09-29 ENCOUNTER — Inpatient Hospital Stay: Payer: Medicare Other

## 2017-09-29 DIAGNOSIS — R918 Other nonspecific abnormal finding of lung field: Secondary | ICD-10-CM

## 2017-09-29 DIAGNOSIS — G9340 Encephalopathy, unspecified: Secondary | ICD-10-CM

## 2017-09-29 DIAGNOSIS — J9601 Acute respiratory failure with hypoxia: Secondary | ICD-10-CM

## 2017-09-29 DIAGNOSIS — I878 Other specified disorders of veins: Secondary | ICD-10-CM

## 2017-09-29 DIAGNOSIS — N179 Acute kidney failure, unspecified: Secondary | ICD-10-CM

## 2017-09-29 DIAGNOSIS — N139 Obstructive and reflux uropathy, unspecified: Secondary | ICD-10-CM

## 2017-09-29 LAB — BLOOD GAS, ARTERIAL
ACID-BASE DEFICIT: 4.5 mmol/L — AB (ref 0.0–2.0)
Acid-base deficit: 5.5 mmol/L — ABNORMAL HIGH (ref 0.0–2.0)
Allens test (pass/fail): POSITIVE — AB
BICARBONATE: 18.3 mmol/L — AB (ref 20.0–28.0)
Bicarbonate: 18.9 mmol/L — ABNORMAL LOW (ref 20.0–28.0)
FIO2: 1
FIO2: 1
LHR: 16 {breaths}/min
O2 Saturation: 93.2 %
O2 Saturation: 99.8 %
PATIENT TEMPERATURE: 37
PEEP: 5 cmH2O
PO2 ART: 65 mmHg — AB (ref 83.0–108.0)
PO2 ART: 229 mmHg — AB (ref 83.0–108.0)
Patient temperature: 37
VT: 550 mL
pCO2 arterial: 27 mmHg — ABNORMAL LOW (ref 32.0–48.0)
pCO2 arterial: 32 mmHg (ref 32.0–48.0)
pH, Arterial: 7.44 (ref 7.350–7.450)
pH, Arterial: 7.38 (ref 7.350–7.450)

## 2017-09-29 LAB — COMPREHENSIVE METABOLIC PANEL
ALBUMIN: 3.2 g/dL — AB (ref 3.5–5.0)
ALT: 11 U/L — ABNORMAL LOW (ref 17–63)
ANION GAP: 14 (ref 5–15)
AST: 275 U/L — AB (ref 15–41)
Alkaline Phosphatase: 115 U/L (ref 38–126)
BUN: 78 mg/dL — AB (ref 6–20)
CHLORIDE: 102 mmol/L (ref 101–111)
CO2: 17 mmol/L — ABNORMAL LOW (ref 22–32)
Calcium: 9 mg/dL (ref 8.9–10.3)
Creatinine, Ser: 2.79 mg/dL — ABNORMAL HIGH (ref 0.61–1.24)
GFR calc Af Amer: 21 mL/min — ABNORMAL LOW (ref >60)
GFR calc non Af Amer: 19 mL/min — ABNORMAL LOW (ref >60)
GLUCOSE: 177 mg/dL — AB (ref 65–99)
POTASSIUM: 5.2 mmol/L — AB (ref 3.5–5.1)
Sodium: 133 mmol/L — ABNORMAL LOW (ref 135–145)
Total Bilirubin: 0.9 mg/dL (ref 0.3–1.2)
Total Protein: 7.2 g/dL (ref 6.5–8.1)

## 2017-09-29 LAB — URINALYSIS, ROUTINE W REFLEX MICROSCOPIC
Bacteria, UA: NONE SEEN
Bilirubin Urine: NEGATIVE
GLUCOSE, UA: NEGATIVE mg/dL
KETONES UR: NEGATIVE mg/dL
NITRITE: NEGATIVE
Protein, ur: 100 mg/dL — AB
SPECIFIC GRAVITY, URINE: 1.018 (ref 1.005–1.030)
SQUAMOUS EPITHELIAL / LPF: NONE SEEN
pH: 5 (ref 5.0–8.0)

## 2017-09-29 LAB — URINALYSIS, COMPLETE (UACMP) WITH MICROSCOPIC
Bilirubin Urine: NEGATIVE
GLUCOSE, UA: NEGATIVE mg/dL
Ketones, ur: NEGATIVE mg/dL
NITRITE: NEGATIVE
PH: 5 (ref 5.0–8.0)
Protein, ur: 100 mg/dL — AB
SPECIFIC GRAVITY, URINE: 1.017 (ref 1.005–1.030)

## 2017-09-29 LAB — TROPONIN I
TROPONIN I: 33.67 ng/mL — AB (ref <0.03)
Troponin I: 31.8 ng/mL (ref <0.03)

## 2017-09-29 LAB — CBC
HCT: 25.9 % — ABNORMAL LOW (ref 40.0–52.0)
Hemoglobin: 8.7 g/dL — ABNORMAL LOW (ref 13.0–18.0)
MCH: 32.6 pg (ref 26.0–34.0)
MCHC: 33.7 g/dL (ref 32.0–36.0)
MCV: 96.8 fL (ref 80.0–100.0)
PLATELETS: 249 10*3/uL (ref 150–440)
RBC: 2.68 MIL/uL — ABNORMAL LOW (ref 4.40–5.90)
RDW: 15.5 % — AB (ref 11.5–14.5)
WBC: 15.5 10*3/uL — AB (ref 3.8–10.6)

## 2017-09-29 LAB — BRAIN NATRIURETIC PEPTIDE

## 2017-09-29 LAB — SURGICAL PATHOLOGY

## 2017-09-29 LAB — GLUCOSE, CAPILLARY
GLUCOSE-CAPILLARY: 137 mg/dL — AB (ref 65–99)
Glucose-Capillary: 156 mg/dL — ABNORMAL HIGH (ref 65–99)
Glucose-Capillary: 174 mg/dL — ABNORMAL HIGH (ref 65–99)

## 2017-09-29 LAB — PROCALCITONIN: Procalcitonin: 1.04 ng/mL

## 2017-09-29 LAB — MRSA PCR SCREENING: MRSA BY PCR: NEGATIVE

## 2017-09-29 MED ORDER — HEPARIN SODIUM (PORCINE) 5000 UNIT/ML IJ SOLN
5000.0000 [IU] | Freq: Two times a day (BID) | INTRAMUSCULAR | Status: DC
  Administered 2017-09-29 – 2017-10-02 (×7): 5000 [IU] via SUBCUTANEOUS
  Filled 2017-09-29 (×6): qty 1

## 2017-09-29 MED ORDER — FENTANYL CITRATE (PF) 100 MCG/2ML IJ SOLN
50.0000 ug | INTRAMUSCULAR | Status: AC | PRN
  Administered 2017-09-29 – 2017-09-30 (×3): 50 ug via INTRAVENOUS
  Filled 2017-09-29 (×3): qty 2

## 2017-09-29 MED ORDER — CITALOPRAM HYDROBROMIDE 20 MG PO TABS: 10.0000 mg | ORAL_TABLET | Freq: Two times a day (BID) | ORAL | Status: DC

## 2017-09-29 MED ORDER — MIDAZOLAM HCL 2 MG/2ML IJ SOLN
1.0000 mg | INTRAMUSCULAR | Status: AC | PRN
  Administered 2017-09-29 – 2017-09-30 (×3): 1 mg via INTRAVENOUS
  Filled 2017-09-29 (×3): qty 2

## 2017-09-29 MED ORDER — CHLORHEXIDINE GLUCONATE 0.12% ORAL RINSE (MEDLINE KIT)
15.0000 mL | Freq: Two times a day (BID) | OROMUCOSAL | Status: DC
  Administered 2017-09-29 – 2017-10-01 (×5): 15 mL via OROMUCOSAL

## 2017-09-29 MED ORDER — CARBIDOPA-LEVODOPA 25-100 MG PO TABS
1.0000 | ORAL_TABLET | Freq: Two times a day (BID) | ORAL | Status: DC
  Filled 2017-09-29: qty 1

## 2017-09-29 MED ORDER — MIDAZOLAM HCL 2 MG/2ML IJ SOLN
1.0000 mg | INTRAMUSCULAR | Status: DC | PRN
  Administered 2017-09-30 (×2): 1 mg via INTRAVENOUS
  Filled 2017-09-29 (×2): qty 2

## 2017-09-29 MED ORDER — FENTANYL CITRATE (PF) 100 MCG/2ML IJ SOLN
50.0000 ug | INTRAMUSCULAR | Status: DC | PRN
  Administered 2017-09-30 (×3): 50 ug via INTRAVENOUS
  Filled 2017-09-29 (×3): qty 2

## 2017-09-29 MED ORDER — OXYBUTYNIN CHLORIDE 5 MG PO TABS
5.0000 mg | ORAL_TABLET | Freq: Three times a day (TID) | ORAL | Status: DC
  Filled 2017-09-29 (×2): qty 1

## 2017-09-29 MED ORDER — SENNA 8.6 MG PO TABS: 1.0000 | ORAL_TABLET | Freq: Every day | ORAL | Status: DC

## 2017-09-29 MED ORDER — ETOMIDATE 2 MG/ML IV SOLN
INTRAVENOUS | Status: AC
  Administered 2017-09-29: 20 mg
  Filled 2017-09-29: qty 10

## 2017-09-29 MED ORDER — LIDOCAINE HCL (PF) 1 % IJ SOLN
0.0000 mL | Freq: Once | INTRAMUSCULAR | Status: DC | PRN
  Filled 2017-09-29: qty 30

## 2017-09-29 MED ORDER — DOCUSATE SODIUM 50 MG/5ML PO LIQD
100.0000 mg | Freq: Two times a day (BID) | ORAL | Status: DC | PRN
Start: 2017-09-29 — End: 2017-09-29

## 2017-09-29 MED ORDER — SODIUM BICARBONATE 650 MG PO TABS
650.0000 mg | ORAL_TABLET | Freq: Two times a day (BID) | ORAL | Status: DC
  Filled 2017-09-29: qty 1

## 2017-09-29 MED ORDER — ETOMIDATE 2 MG/ML IV SOLN
0.3000 mg/kg | Freq: Once | INTRAVENOUS | Status: AC
  Administered 2017-09-29: 23.28 mg via INTRAVENOUS

## 2017-09-29 MED ORDER — MIDODRINE HCL 5 MG PO TABS
5.0000 mg | ORAL_TABLET | Freq: Three times a day (TID) | ORAL | Status: DC
  Filled 2017-09-29 (×2): qty 1

## 2017-09-29 MED ORDER — PIPERACILLIN-TAZOBACTAM 3.375 G IVPB
3.3750 g | Freq: Three times a day (TID) | INTRAVENOUS | Status: DC
  Administered 2017-09-29: 3.375 g via INTRAVENOUS
  Filled 2017-09-29: qty 50

## 2017-09-29 MED ORDER — ACETAMINOPHEN 325 MG PO TABS: 650.0000 mg | ORAL_TABLET | Freq: Four times a day (QID) | ORAL | Status: DC | PRN

## 2017-09-29 MED ORDER — ROCURONIUM BROMIDE 50 MG/5ML IV SOLN
INTRAVENOUS | Status: AC
  Administered 2017-09-29: 50 mg
  Filled 2017-09-29: qty 1

## 2017-09-29 MED ORDER — FAMOTIDINE 20 MG IN NS 100 ML IVPB
20.0000 mg | INTRAVENOUS | Status: DC
  Administered 2017-09-29 – 2017-10-01 (×3): 20 mg via INTRAVENOUS
  Filled 2017-09-29 (×4): qty 100

## 2017-09-29 MED ORDER — LACTATED RINGERS IV SOLN: INTRAVENOUS | Status: DC

## 2017-09-29 MED ORDER — FAMOTIDINE 20 MG IN NS 100 ML IVPB: 20.0000 mg | Freq: Two times a day (BID) | INTRAVENOUS | Status: DC

## 2017-09-29 MED ORDER — PIPERACILLIN-TAZOBACTAM 3.375 G IVPB
3.3750 g | Freq: Two times a day (BID) | INTRAVENOUS | Status: DC
  Administered 2017-09-29 – 2017-10-02 (×6): 3.375 g via INTRAVENOUS
  Filled 2017-09-29 (×6): qty 50

## 2017-09-29 MED ORDER — ORAL CARE MOUTH RINSE
15.0000 mL | Freq: Four times a day (QID) | OROMUCOSAL | Status: DC
  Administered 2017-09-29 – 2017-10-01 (×6): 15 mL via OROMUCOSAL

## 2017-09-29 MED ORDER — ROCURONIUM BROMIDE 50 MG/5ML IV SOLN
1.0000 mg/kg | Freq: Once | INTRAVENOUS | Status: AC
  Administered 2017-09-29: 77.6 mg via INTRAVENOUS

## 2017-09-29 MED ORDER — ACETAMINOPHEN 650 MG RE SUPP: 650.0000 mg | Freq: Four times a day (QID) | RECTAL | Status: DC | PRN

## 2017-09-29 MED ORDER — SODIUM BICARBONATE 8.4 % IV SOLN
INTRAVENOUS | Status: DC
  Administered 2017-09-29 – 2017-10-01 (×3): via INTRAVENOUS
  Filled 2017-09-29 (×4): qty 150

## 2017-09-29 NOTE — Progress Notes (Signed)
   09/29/17 1050  Clinical Encounter Type  Visited With Patient not available;Health care provider  Visit Type Follow-up  Referral From Nurse  Consult/Referral To [place] followed team to patient room; stood outside maintaining pastoral presence and offering silent prayer.  Present for doctor speaking with patient daughter; chaplain escorting patient daughter updated this chaplain on events leading up to transition of units.  Chaplain introduced self to daughter; daughter focusing on contacting family.  Unit chaplain to continue to follow patient and family.

## 2017-09-29 NOTE — Procedures (Signed)
PROCEDURE NOTE: CVL PLACEMENT  INDICATION:    Monitoring of central venous pressures and/or administration of medications optimally administered in central vein  CONSENT:   Risks of procedure as well as the alternatives were explained to the patient or surrogate. Consent for procedure obtained. A time out was performed.   PROCEDURE  Sterile technique was used including antiseptics, cap, gloves, gown, hand hygiene, mask and full body sheet.  Skin prep: Chlorhexidine; local anesthetic administered  A triple lumen catheter was placed in the [place] vein using the Seldinger technique.  Ultrasound was used for vessel identification and guidance.   EVALUATION:  Blood flow good  Complications: No apparent complications  Patient tolerated the procedure well.  Chest X-ray revealed proper position of central line with no pneumothorax   Merton Border, MD PCCM service Mobile [phone] 09/29/2017 3:15 PM

## 2017-09-29 NOTE — Progress Notes (Signed)
   09/29/17 1500  Clinical Encounter Type  Visited With Patient;Family;Other (Comment) (escorted family pastor to patient room)  Visit Type Follow-up  Spiritual Encounters  Spiritual Needs Emotional   Chaplain offered silent prayer for patient then moved to waiting room to offer support to family.  Chaplain spoke with patient wife, daughters, and extended family.  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  Chaplain offered emotional support and utilized active listening.  Chaplain educated family as to availability of on-call chaplain.  Chaplain met patient's pastor and escorted him to patient bedside.

## 2017-09-29 NOTE — Progress Notes (Signed)
   09/29/17 1130  Clinical Encounter Type  Visited With Family;Health care provider  Visit Type Follow-up  Spiritual Encounters  Spiritual Needs Emotional   Chaplain sat with patient daughter while awaiting physician report.  Chaplain utilized active listening and offered emotional support to daughter and additional family members as they arrived.  Chaplain remained as a support person during 26 meeting with patient family.  Family then expressed a desire to have some time together.  Chaplain to continue to follow.

## 2017-09-29 NOTE — Progress Notes (Signed)
1100 Admitted from [facility] via bed due to extreme lethargy and altered mental staus. Patient was unable to communicate with staff. Intubated per Dr. Alva Garnet. 20 mg of Etomidate and 50mg  of Rocuronium given for intubation. 50mg  of Fentanyl given before TLC placed. Right subclavian TLC also inserted per Dr. Alva Garnet.

## 2017-09-29 NOTE — Progress Notes (Signed)
Patient lethargic but fidgety. Only opens eyes for brief moments. Pt Did not eat breakfast. Did not administer 10am meds. Patient not alert enough to safely administer.

## 2017-09-29 NOTE — Progress Notes (Signed)
Alert to self. Opens eyes and answers some questions appropriately; HOB elevated and medication given with sips of water, coughing noted with thin liquid; remainder of pills given with applesauce with no coughing noted; May benefit from speech evaluation.Barbaraann Faster, RN 2300; 09/28/2017

## 2017-09-29 NOTE — Progress Notes (Signed)
Venti mask applied: sat. 92% on 55% O2. Will  Pass on to on-coming shift to wean back to [place]. Barbaraann Faster, RN 6:32 AM; .09/29/2017

## 2017-09-29 NOTE — Consult Note (Addendum)
Pharmacy Antibiotic Note  Keith Woods is a 84 y.o. male admitted on 09/25/2017.  Pharmacy has been consulted for Zosyn dosing for aspiration PNA.  Plan: Start Zosyn 3.375 IV EI every 12 hour based on CrCl <60ml/min   Height: 5\' 11"  (180.3 cm) Weight: 161 lb 2.5 oz (73.1 kg) IBW/kg (Calculated) : 75.3  Temp (24hrs), Avg:97.9 F (36.6 C), Min:97.6 F (36.4 C), Max:98.2 F (36.8 C)  Recent Labs  Lab 09/25/17 2108 09/26/17 0505 09/27/17 0751 09/28/17 0856  WBC 13.6* 12.7*  --   --   CREATININE 2.47* 2.62* 3.31* 2.81*    Estimated Creatinine Clearance: 18.1 mL/min (A) (by C-G formula based on SCr of 2.81 mg/dL (H)).    Allergies  Allergen Reactions  . Lipitor [Atorvastatin Calcium]     Leg pain  . Nsaids   . Statins     Leg pain    Antimicrobials this admission: 2/19 Zosyn>>   Dose adjustments this admission:  Microbiology results: 2/19  BCx: pending 2/19  Sputum: pending 2/19 MRSA PCR: pending  Thank you for allowing pharmacy to be a part of this patient's care.  Pernell Dupre, PharmD, BCPS Clinical Pharmacist 09/29/2017 12:17 PM

## 2017-09-29 NOTE — Progress Notes (Signed)
Chaplain Carver responded to a Rapid Response to 2C-221. Chaplain met Abbey Chatters and assumed pastoral care. Chaplain offered silent prayer for patient, family, and staff. Chaplain talked with patient's daughter, Jenny Reichmann, and provided active listening and emotional support while she processed the sudden shift in her father's condition. Assisted daughter to CCU #9. Chaplain referred care to [facility]. Additional family members have been notified and are in route to [facility].

## 2017-09-29 NOTE — Consult Note (Signed)
PULMONARY / CRITICAL CARE MEDICINE   Name: Keith Woods MRN: [id number] DOB: 04-25-1926    ADMISSION DATE:  09/25/2017 CONSULTATION DATE:  09/29/17  PT PROFILE: 75 M with history of Parkinson's, ischemic cardiomyopathy (LVEF 20-25% by echocardiogram 05/30/17), chronic orthostatic hypotension adm 02/16 to hospitalist service with acute urinary retention and AKI.  Transferred to ICU 2/19 with progressive alteration in mental status and dyspnea  MAJOR EVENTS/TEST RESULTS: 02/15 admitted to hospitalist service as above 02/16 CTAP: Distended urinary bladder filled with high-density material, likely blood clot in the setting of hematuria. Large bladder mass is not entirely excluded based on imaging findings alone. This could be further assessed with cystoscopy. Mild bilateral hydroureteronephrosis 02/16 Cystourethroscopy with limited transurethral resection of the prostate, clot evacuation, fulguration (Dr Link Snuffer) 02/18 CT head: No acute intracranial abnormality.  Moderate to severe chronic small vessel white matter ischemic changes and remote infarcts 02/19 worsening delirium and respiratory distress.  Transferred to ICU and required intubation shortly after arrival  INDWELLING DEVICES:: ETT 02/19 >>  R [place] CVL 02/19 >>   MICRO DATA: MRSA PCR 02/19 >> NEG Urine 02/19 >>  Resp 02/19 >>  Blood 02/19 >>   PCT 02/19:   ANTIMICROBIALS:  Pip-tazo 02/19 >>     HISTORY OF PRESENT ILLNESS:   Level 5 caveat.  Patient is unable to provide any history due to altered cognition and respiratory distress.  Family reports worsening delirium over the past 24-48 hours.  Nurses notes indicate worsening hypoxemia and respiratory distress over the past 12 hours.  One nurses note also suggests possible aspiration while drinking water  PAST MEDICAL HISTORY :  He  has a past medical history of Chronic kidney disease, Coronary artery disease, GERD (gastroesophageal reflux disease), History of  prostate cancer, History of transient ischemic attack (TIA), Hyperlipidemia, Hypertension, LBBB (left bundle branch block), Stroke ([place]) (02/2016), and Syncope and collapse.  PAST SURGICAL HISTORY: He  has a past surgical history that includes Tonsillectomy; transthoracic echocardiogram (10/10/2008); Cardiac catheterization (08/28/2006); Transurethral resection of bladder tumor (N/A, 09/26/2017); Cystoscopy with stent placement (Bilateral, 09/26/2017); Fulguration of bladder tumor (N/A, 09/26/2017); Hematoma evacuation (N/A, 09/26/2017); Cystoscopy/retrograde/ureteroscopy (09/26/2017); and Transurethral resection of prostate (09/26/2017).  Allergies  Allergen Reactions  . Lipitor [Atorvastatin Calcium]     Leg pain  . Nsaids   . Statins     Leg pain    No current facility-administered medications on file prior to encounter.    Current Outpatient Medications on File Prior to Encounter  Medication Sig  . allopurinol (ZYLOPRIM) 100 MG tablet TAKE 2 TABLETS DAILY  . aspirin EC 81 MG EC tablet Take 1 tablet (81 mg total) by mouth daily.  Marland Kitchen b complex vitamins tablet Take 1 tablet by mouth daily.  . carbidopa-levodopa (SINEMET IR) 25-100 MG tablet Take 1 tablet by mouth 2 (two) times daily.   . citalopram (CELEXA) 10 MG tablet take 1 tablet by mouth twice a day  . Colchicine (MITIGARE) 0.6 MG CAPS Two tablets today, then 1 tablet daily  . midodrine (PROAMATINE) 10 MG tablet Take 1 tablet (10 mg total) by mouth 3 (three) times daily with meals.  . Multiple Vitamin (MULTIVITAMIN) tablet Take 1 tablet by mouth daily.    . nitroGLYCERIN (NITROSTAT) 0.4 MG SL tablet Place 1 tablet (0.4 mg total) under the tongue every 5 (five) minutes as needed for chest pain.  . Probiotic Product (ALIGN) 4 MG CAPS Take 4 mg by mouth daily.  . sodium bicarbonate 650 MG  tablet Take 650 mg by mouth 2 (two) times daily.  . Vitamin D, Ergocalciferol, (DRISDOL) 50000 units CAPS capsule TAKE 1 CAPSULE ONCE A WEEK    FAMILY  HISTORY:  His indicated that his mother is deceased. He indicated that his father is deceased. He indicated that his maternal grandmother is deceased. He indicated that his maternal grandfather is deceased. He indicated that his paternal grandmother is deceased. He indicated that his paternal grandfather is deceased.   SOCIAL HISTORY: He  reports that he has quit smoking. he has never used smokeless tobacco. He reports that he does not drink alcohol or use drugs.  REVIEW OF SYSTEMS:   Level 5 caveat  SUBJECTIVE:    VITAL SIGNS: BP 92/71   Pulse (!) 103   Temp 98.2 F (36.8 C)   Resp (!) 26   Ht 5\' 11"  (1.803 m)   Wt 73.1 kg (161 lb 2.5 oz)   SpO2 99%   BMI 22.48 kg/m   HEMODYNAMICS:    VENTILATOR SETTINGS: Vent Mode: PRVC FiO2 (%):  [50 %-100 %] 50 % Set Rate:  [16 bmp] 16 bmp Vt Set:  [550 mL] 550 mL PEEP:  [5 cmH20] 5 cmH20  INTAKE / OUTPUT: I/O last 3 completed shifts: In: 5259 [P.O.:50; I.V.:2809; Other:2400] Out: 4350 [Urine:4350]  PHYSICAL EXAMINATION: General: Appears younger than chronologic age, somnolent but with increased WOB Neuro: CNs intact, moves all extremities, DTRs symmetric HEENT: NCAT, sclerae white, dentition poor Cardiovascular: Tachycardic, regular, no M noted Lungs: Few scattered rhonchi, no wheezes Abdomen: Soft, NT, diminished BS Extremities: Cool without edema, diminished pedal pulses Skin: No lesions noted  LABS:  BMET Recent Labs  Lab 09/27/17 0751 09/28/17 0856 09/29/17 1215  NA 135 132* 133*  K 4.7 4.9 5.2*  CL 101 99* 102  CO2 22 18* 17*  BUN 52* 65* 78*  CREATININE 3.31* 2.81* 2.79*  GLUCOSE 120* 137* 177*    Electrolytes Recent Labs  Lab 09/27/17 0751 09/28/17 0856 09/29/17 1215  CALCIUM 9.4 9.4 9.0  MG  --  2.0  --     CBC Recent Labs  Lab 09/25/17 2108 09/26/17 0505 09/27/17 0751 09/28/17 0856 09/29/17 1215  WBC 13.6* 12.7*  --   --  15.5*  HGB 13.1 11.2* 8.8* 8.7* 8.7*  HCT 39.2* 32.7* 27.2*  25.7* 25.9*  PLT 267 228  --   --  249    Coag's No results for input(s): APTT, INR in the last 168 hours.  Sepsis Markers Recent Labs  Lab 09/29/17 1215  PROCALCITON 1.04    ABG Recent Labs  Lab 09/29/17 1024 09/29/17 1200  PHART 7.44 7.38  PCO2ART 27* 32  PO2ART 65* 229*    Liver Enzymes Recent Labs  Lab 09/29/17 1215  AST 275*  ALT 11*  ALKPHOS 115  BILITOT 0.9  ALBUMIN 3.2*    Cardiac Enzymes Recent Labs  Lab 09/26/17 0505 09/26/17 1049  TROPONINI 0.10* 0.14*    Glucose Recent Labs  Lab 09/29/17 1022 09/29/17 1043 09/29/17 1155  GLUCAP 137* 156* 174*    CXR: R > L infiltrates consistent with pneumonia versus asymmetric edema    ASSESSMENT / PLAN:  PULMONARY A: Acute hypoxemic respiratory failure Respiratory distress Asymmetric pulmonary infiltrates -edema versus pneumonia P:   Intubated by me shortly after arrival to ICU Vent settings established Vent bundle implemented Daily SBT as indicated  CARDIOVASCULAR A:  Severe ischemic cardiomyopathy (LVEF 20-25%) Chronic orthostatic hypotension P:  Monitor BP and rhythm Serial  cardiac markers Check BNP Cont midodrine @ reduced dose DNR - see below  RENAL A:   Urinary retention s/p cystourethroscopy with limited TURP AKI CKD Chronic metabolic acidosis P:   Monitor BMET intermittently Monitor I/Os Correct electrolytes as indicated  HCO3 infusion ordered Not a candidate for HD - see below  GASTROINTESTINAL A:   Possible dysphagia with aspiration Difficult OGT placement P:   SUP: IV famotidine Consider IR to place OGT or NGT 02/20 Consider TF protocol  HEMATOLOGIC A:   Acute blood loss anemia (urinary tract) - not actively bleeding presently P:  DVT px: SQ heparin Monitor CBC intermittently Transfuse per usual guidelines   INFECTIOUS A:   Possible severe sepsis Possible aspiration PNA Possible UTI P:   Monitor temp, WBC count Micro and abx as above    ENDOCRINE A:   Stress induced hyperglycemia without history of DM P:   Monitor glucose on chemistry panels Consider SSI for glucose > 180  NEUROLOGIC A:   Acute encephalopathy ICU/ventilator associated discomfort P:   RASS goal: -1, -2 PAD protocol -intermittent fentanyl, midazolam   FAMILY/GOALS OF CARE: 02/19 Wife, daughter and other family members updated in detail.  Goals of care discussed.  Acknowledging his chronic medical problems and advanced age, we all agreed that he would not undergo CPR in the event of cardiac arrest or dialysis in the event of progressive renal failure.  We also agreed that our current level of care would not be extended beyond a few days unless there was clear evidence of improvement.    CCM time: 50 mins The above time includes time spent in consultation with patient and/or family members and reviewing care plan on multidisciplinary rounds  Merton Border, MD PCCM service Mobile [phone] Pager [phone]    09/29/2017, 2:47 PM

## 2017-09-29 NOTE — Progress Notes (Signed)
[place] [facility] [place] NAME: Arkeem Harts    MR#:  [id number]  DATE OF BIRTH:  Sep 02, 1925  SUBJECTIVE:  Pt got transferred to ICU after his sats declined and unable to hold venti mask. Intubated and on the ventilator REVIEW OF SYSTEMS:    Review of Systems  Unable to perform ROS: Intubated    DRUG ALLERGIES:   Allergies  Allergen Reactions  . Lipitor [Atorvastatin Calcium]     Leg pain  . Nsaids   . Statins     Leg pain    VITALS:  Blood pressure 92/71, pulse (!) 103, temperature 98.2 F (36.8 C), resp. rate (!) 26, height 5\' 11"  (1.803 m), weight 73.1 kg (161 lb 2.5 oz), SpO2 99 %.  PHYSICAL EXAMINATION:   Physical Exam  GENERAL:  84 y.o.-year-old patient lying in the bed with no acute distress. Critically ill EYES: Pupils equal, round, reactive to light and accommodation. No scleral icterus.  HEENT: Head atraumatic, normocephalic. Oropharynx and nasopharynx clear. intubated NECK:  Supple, no jugular venous distention. No thyroid enlargement, no tenderness.  LUNGS: decreased breath sounds bilaterally, no wheezing, rales, rhonchi. No use of accessory muscles of respiration.  CARDIOVASCULAR: S1, S2 normal. No murmurs, rubs, or gallops.  ABDOMEN: Soft, nontender, nondistended. Bowel sounds present. No organomegaly or mass. Foley draining clear urine EXTREMITIES: No cyanosis, clubbing or edema b/l.    NEUROLOGIC: intubated PSYCHIATRIC: sedated SKIN: No obvious rash, lesion, or ulcer.   LABORATORY PANEL:   CBC Recent Labs  Lab 09/29/17 1215  WBC 15.5*  HGB 8.7*  HCT 25.9*  PLT 249   ------------------------------------------------------------------------------------------------------------------ Chemistries  Recent Labs  Lab 09/28/17 0856 09/29/17 1215  NA 132* 133*  K 4.9 5.2*  CL 99* 102  CO2 18* 17*  GLUCOSE 137* 177*  BUN 65* 78*  CREATININE 2.81* 2.79*  CALCIUM 9.4 9.0  MG 2.0  --   AST  --  275*  ALT   --  11*  ALKPHOS  --  115  BILITOT  --  0.9   ------------------------------------------------------------------------------------------------------------------  Cardiac Enzymes Recent Labs  Lab 09/29/17 1635  TROPONINI 31.80*   ------------------------------------------------------------------------------------------------------------------  RADIOLOGY:  Ct Head Wo Contrast  Result Date: 09/28/2017 CLINICAL DATA:  84 year old male with acute confusion and altered mental status. EXAM: CT HEAD WITHOUT CONTRAST TECHNIQUE: Contiguous axial images were obtained from the base of the skull through the vertex without intravenous contrast. COMPARISON:  05/30/2017 CT and prior exams. FINDINGS: Brain: No evidence of acute infarction, hemorrhage, hydrocephalus, extra-axial collection or mass lesion/mass effect. Moderate-severe chronic small-vessel white matter ischemic changes and remote basal ganglia and thalamic infarcts again noted. Vascular: Atherosclerotic calcifications noted. Skull: Normal. Negative for fracture or focal lesion. Sinuses/Orbits: No acute finding. Other: None. IMPRESSION: 1. No evidence of acute intracranial abnormality 2. Moderate to severe chronic small-vessel white matter ischemic changes and remote infarcts as described. Electronically Signed   By: Margarette Canada M.D.   On: 09/28/2017 15:16   Dg Chest Port 1 View  Result Date: 09/29/2017 CLINICAL DATA:  84 year old male post endotracheal tube placement. Initial encounter. EXAM: PORTABLE CHEST 1 VIEW COMPARISON:  05/29/2017 chest x-ray. FINDINGS: Endotracheal tube tip 3 cm above the carina. Right central line tip distal superior vena cava level. No pneumothorax. Asymmetric airspace disease suggestive of pulmonary edema with small right-sided pleural effusion. Top-normal to slightly enlarged heart. Calcified tortuous aorta. IMPRESSION: Endotracheal tube tip 3 cm above the carina. Right central line tip distal superior  vena cava level.  Asymmetric airspace disease suggestive of pulmonary edema with small right-sided pleural effusion. Top-normal to slightly enlarged heart. Aortic Atherosclerosis (ICD10-I70.0). Electronically Signed   By: Genia Del M.D.   On: 09/29/2017 11:48     ASSESSMENT AND PLAN:  Koki Buxton  is a 84 y.o. 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, chronic orthostatic hypotension on [place], presented to the hospital with acute urinary obstruction/inability to void, noted recurrent hematuria  * acute Hypoxic respiratory due to acute on chronic systolic CHF with volume overload suspected IVF  -intubated and on the vent -appreciate ICU attending input  *elevated troponin/Acute ACS /Demand ischemia ginve severe CMP EF 20-25% -spoke with RN--Cardiology consult will be placed by ICU MD -ASA per rectal  * Acute encephalopathy/?delirium--now intubated and on the vent -multifactorial -recent sx, lack of sleep, anemia, dehydration -CT head negative  *Acute urinary retention secondary to large bladder clot.  Status post cystoscopy with clot evacuation and fulguration POD # 3 -s/p CBI---now stopped -h/o Radiation cystitis  *Acute blood loss anemia secondary to hematuria. -  Hemoglobin at 8.8.  No need for transfusion.  -Baseline of 12  *Acute kidney injury over CKD stage III secondary to post renal obstruction and also ATN.  - We will increase fluids to 75 mL/h.  Monitor input and output. Close monitoring for fluid overload with patient ejection fraction being 30%. -creat 2.12--2.47--2.62--3.31--2.81--2.79 -Baseline creat 2.09  * chronic GERD without esophagitis PPI daily  * chronic Parkinson's disease On Sinemet at home  Pt has poor prognosis He is now  DNR  All the records are reviewed and case discussed with Care Management/Social Workerr. Management plans discussed with the patient, family and they are in agreement.  CODE  STATUS:DNR  DVT Prophylaxis: SCDs  TOTAL TIME TAKING CARE OF THIS PATIENT: 30 minutes.     Fritzi Mandes M.D on 09/29/2017 at 5:43 PM  Between 7am to 6pm - Pager - [phone]  After 6pm go to [email or web address] - password [email or web address] [facility]  [facility] [place] [facility]  Office  [phone]  CC: Primary care physician; [name], MD  Note: This dictation was prepared with Dragon dictation along with smaller phrase technology. Any transcriptional errors that result from this process are unintentional.

## 2017-09-29 NOTE — Progress Notes (Signed)
O2 sat 82/2-3LPM via ; unable to get patient to close mouth and breath through nose; consulted RT to get Venti mask applied; Barbaraann Faster, RN 6:20 AM 09/29/2017

## 2017-09-29 NOTE — Progress Notes (Signed)
Urology follow-up  Urine remains clear this morning.  Overnight, he had issues with oxygen saturation on Ventimask.  He continues to remain confused but did sleep some last night per his daughter.  Voiding trial per primary team.  Given his mental status, I am highly suspicious that he may not void spontaneously.  If he fails a voiding trial, would recommend replacement with 16 French Foley catheter as needed.  Urology will sign off, please contact with any questions or concerns or if hematuria recurs.  Hollice Espy, MD

## 2017-09-29 NOTE — Progress Notes (Signed)
Notified Dr Erlene Quan, patient has blood in urine. Advised to flush foley to make sure there is no blood clot in the line. Catheter line flushed well. Attempted to wake patient . Patient would not arouse but was incoherently pulling at heart monitor leads and foley. Patients daughter stated this was not his baseline and that usually he is a big talker. Pt grunting but no comprehensible words spoken. Asked NT to get O2 sat. Pt O2, 88-89% on venturi mask 55%. Entered room with Camera operator. Patient pale and sweaty. O2 now 86%. Notified Dr Posey Pronto of patient current condition and asked for a Blood gas. Called Rapid Response. Upon assessment, decided to transfer to CCU #9.   Patients daughter now in room. Updated her on the status of her dad and the reason why we were moving him to ICU.   Report given to [facility] RN and Dr Alva Garnet.

## 2017-09-29 NOTE — Progress Notes (Signed)
Pharmacy Note:  84 yo M ordered Famotidine 20 mg IV q12h.  Scr 2.81  Crcl 19.2 ml/min  For Crcl < 50 ml/min, Dose adjusted to Famotidine 20 mg IV Q24h.  Chinita Greenland PharmD Clinical Pharmacist 09/29/2017

## 2017-09-29 NOTE — Procedures (Signed)
Oral Intubation Procedure Note  Indications: Respiratory insufficiency Consent: Unable to obtain consent because of altered level of consciousness. Time Out: Verified patient identification, verified procedure, site/side was marked, verified correct patient position, special equipment/implants available, medications/allergies/relevent history reviewed, required imaging and test results available.   Pre-meds: Etomidate 20 mg IV  Neuromuscular blockade: Rocuronium 50 mg IV  Laryngoscope: #4 MAC  Visualization: cords fully visualized  ETT: 8.0 ETT passed on first attempt and secured @ 25 cm at upper incisors  Findings: normal airway   Evaluation:  Tube position confirmed by auscultation and EZCap CXR revealed proper positioning of the endotracheal tube  Pt tolerated procedure well with very brief desaturation   Merton Border, MD PCCM service Mobile [phone] Pager [phone] 09/29/2017

## 2017-09-29 NOTE — Significant Event (Signed)
Rapid Response Event Note  Overview: Time Called: 1015 Arrival Time: 1017 Event Type: Respiratory  Initial Focused Assessment:   RR RN entered room, pt in bed, pale, diaphoretic, respirations labored, minimally responsive. BP 120/60, HR 100 with BBB, CBG 170, O2 85%. Pt lung sounds were extremely diminished on right side, inspiratory and expiratory wheezes on left.    Interventions:  RR Monitors applied to pt, immediate transfer to ICU by RR RN and AC, accompanied by floor RN, SWOT RN, RT.   Plan of Care (if not transferred):  Care to be assumed by intensiveist and ICU team.   Event Summary:   at      at    Outcome: Transferred (Comment)  Event End Time: [place]

## 2017-09-29 NOTE — Progress Notes (Signed)
1800 Becoming more alert. No sedation given. Showing agitation with the right arm. Right hand mitted to prevent extubation.Moving right side more with some movement noted in left side to pain. Family in and out. Family made patient a DNR after intubaion.

## 2017-09-29 NOTE — Progress Notes (Signed)
PT Cancellation Note  Patient Details Name: Keith Woods MRN: [id number] DOB: 1926-01-02   Cancelled Treatment:    Reason Eval/Treat Not Completed: Medical issues which prohibited therapy Pt with continued elevated HR, has needed ventimask and was ultimately transferred to the CCU.  Will need new orders when/if he is appropriate.   Kreg Shropshire, DPT 09/29/2017, 10:46 AM

## 2017-09-30 ENCOUNTER — Inpatient Hospital Stay: Payer: Medicare Other

## 2017-09-30 ENCOUNTER — Inpatient Hospital Stay (HOSPITAL_COMMUNITY)
Admit: 2017-09-30 | Discharge: 2017-09-30 | Disposition: A | Discharge status: Home / Self Care | Payer: Medicare Other | Attending: Pulmonary Disease | Admitting: Pulmonary Disease

## 2017-09-30 DIAGNOSIS — I4891 Unspecified atrial fibrillation: Secondary | ICD-10-CM

## 2017-09-30 DIAGNOSIS — N183 Chronic kidney disease, stage 3 (moderate): Secondary | ICD-10-CM

## 2017-09-30 DIAGNOSIS — I21A9 Other myocardial infarction type: Secondary | ICD-10-CM

## 2017-09-30 DIAGNOSIS — I340 Nonrheumatic mitral (valve) insufficiency: Secondary | ICD-10-CM

## 2017-09-30 DIAGNOSIS — I5023 Acute on chronic systolic (congestive) heart failure: Secondary | ICD-10-CM

## 2017-09-30 DIAGNOSIS — R748 Abnormal levels of other serum enzymes: Secondary | ICD-10-CM

## 2017-09-30 LAB — CBC WITH DIFFERENTIAL/PLATELET
BASOS ABS: 0 10*3/uL (ref 0–0.1)
Basophils Relative: 0 %
EOS PCT: 0 %
Eosinophils Absolute: 0 10*3/uL (ref 0–0.7)
HCT: 21.6 % — ABNORMAL LOW (ref 40.0–52.0)
Hemoglobin: 7.2 g/dL — ABNORMAL LOW (ref 13.0–18.0)
LYMPHS PCT: 9 %
Lymphs Abs: 1.1 10*3/uL (ref 1.0–3.6)
MCH: 32.1 pg (ref 26.0–34.0)
MCHC: 33.4 g/dL (ref 32.0–36.0)
MCV: 96.1 fL (ref 80.0–100.0)
Monocytes Absolute: 1.4 10*3/uL — ABNORMAL HIGH (ref 0.2–1.0)
Monocytes Relative: 12 %
NEUTROS PCT: 79 %
Neutro Abs: 9 10*3/uL — ABNORMAL HIGH (ref 1.4–6.5)
PLATELETS: 224 10*3/uL (ref 150–440)
RBC: 2.25 MIL/uL — AB (ref 4.40–5.90)
RDW: 15.7 % — ABNORMAL HIGH (ref 11.5–14.5)
WBC: 11.5 10*3/uL — AB (ref 3.8–10.6)

## 2017-09-30 LAB — BASIC METABOLIC PANEL
Anion gap: 13 (ref 5–15)
BUN: 79 mg/dL — AB (ref 6–20)
CO2: 23 mmol/L (ref 22–32)
Calcium: 8.6 mg/dL — ABNORMAL LOW (ref 8.9–10.3)
Chloride: 101 mmol/L (ref 101–111)
Creatinine, Ser: 2.85 mg/dL — ABNORMAL HIGH (ref 0.61–1.24)
GFR, EST AFRICAN AMERICAN: 21 mL/min — AB (ref >60)
GFR, EST NON AFRICAN AMERICAN: 18 mL/min — AB (ref >60)
Glucose, Bld: 150 mg/dL — ABNORMAL HIGH (ref 65–99)
POTASSIUM: 4.2 mmol/L (ref 3.5–5.1)
SODIUM: 137 mmol/L (ref 135–145)

## 2017-09-30 LAB — PROCALCITONIN: PROCALCITONIN: 1.57 ng/mL

## 2017-09-30 LAB — ABO/RH: ABO/RH(D): B POS

## 2017-09-30 LAB — ECHOCARDIOGRAM COMPLETE
Height: 71 in
Weight: 2578.5 oz

## 2017-09-30 LAB — URINE CULTURE: CULTURE: NO GROWTH

## 2017-09-30 LAB — TROPONIN I: Troponin I: 35.14 ng/mL (ref <0.03)

## 2017-09-30 LAB — PREPARE RBC (CROSSMATCH)

## 2017-09-30 MED ORDER — AMIODARONE HCL IN DEXTROSE 360-4.14 MG/200ML-% IV SOLN
60.0000 mg/h | INTRAVENOUS | Status: DC
  Administered 2017-09-30 (×2): 60 mg/h via INTRAVENOUS
  Filled 2017-09-30: qty 200

## 2017-09-30 MED ORDER — AMIODARONE HCL IN DEXTROSE 360-4.14 MG/200ML-% IV SOLN
INTRAVENOUS | Status: AC
  Administered 2017-09-30: 150 mg via INTRAVENOUS
  Filled 2017-09-30: qty 200

## 2017-09-30 MED ORDER — METOPROLOL TARTRATE 5 MG/5ML IV SOLN
2.5000 mg | INTRAVENOUS | Status: DC | PRN
  Administered 2017-09-30: 5 mg via INTRAVENOUS
  Filled 2017-09-30: qty 5

## 2017-09-30 MED ORDER — SODIUM CHLORIDE 0.9 % IV SOLN
Freq: Once | INTRAVENOUS | Status: AC
  Administered 2017-10-01: 10:00:00 via INTRAVENOUS

## 2017-09-30 MED ORDER — PHENYLEPHRINE HCL 10 MG/ML IJ SOLN
0.0000 ug/min | INTRAMUSCULAR | Status: DC
  Administered 2017-09-30: 20 ug/min via INTRAVENOUS
  Filled 2017-09-30: qty 40

## 2017-09-30 MED ORDER — SODIUM CHLORIDE 0.9 % IV BOLUS (SEPSIS)
500.0000 mL | Freq: Once | INTRAVENOUS | Status: AC
  Administered 2017-09-30: 500 mL via INTRAVENOUS

## 2017-09-30 MED ORDER — AMIODARONE LOAD VIA INFUSION
150.0000 mg | Freq: Once | INTRAVENOUS | Status: AC
  Administered 2017-09-30 (×2): 150 mg via INTRAVENOUS
  Filled 2017-09-30: qty 83.34

## 2017-09-30 MED ORDER — AMIODARONE HCL IN DEXTROSE 360-4.14 MG/200ML-% IV SOLN
30.0000 mg/h | INTRAVENOUS | Status: DC
  Administered 2017-09-30 – 2017-10-01 (×3): 30 mg/h via INTRAVENOUS
  Filled 2017-09-30 (×3): qty 200

## 2017-09-30 NOTE — Progress Notes (Signed)
[facility] [place] NAME: Keith Woods    MR#:  [id number]  DATE OF BIRTH:  1926/01/12  SUBJECTIVE:  CHIEF COMPLAINT:   Chief Complaint  Patient presents with  . Near Syncope   Patient is intubated and sedated, no events overnight REVIEW OF SYSTEMS:  CONSTITUTIONAL: No fever, fatigue or weakness.  EYES: No blurred or double vision.  EARS, NOSE, AND THROAT: No tinnitus or ear pain.  RESPIRATORY: No cough, shortness of breath, wheezing or hemoptysis.  CARDIOVASCULAR: No chest pain, orthopnea, edema.  GASTROINTESTINAL: No nausea, vomiting, diarrhea or abdominal pain.  GENITOURINARY: No dysuria, hematuria.  ENDOCRINE: No polyuria, nocturia,  HEMATOLOGY: No anemia, easy bruising or bleeding SKIN: No rash or lesion. MUSCULOSKELETAL: No joint pain or arthritis.   NEUROLOGIC: No tingling, numbness, weakness.  PSYCHIATRY: No anxiety or depression.   ROS  DRUG ALLERGIES:   Allergies  Allergen Reactions  . Lipitor [Atorvastatin Calcium]     Leg pain  . Nsaids   . Statins     Leg pain    VITALS:  Blood pressure (!) 78/59, pulse 60, temperature (!) 97.5 F (36.4 C), temperature source Axillary, resp. rate (!) 21, height 5\' 11"  (1.803 m), weight 73.1 kg (161 lb 2.5 oz), SpO2 100 %.  PHYSICAL EXAMINATION:  GENERAL:  84 y.o.-year-old patient lying in the bed with no acute distress.  EYES: Pupils equal, round, reactive to light and accommodation. No scleral icterus. Extraocular muscles intact.  HEENT: Head atraumatic, normocephalic. Oropharynx and nasopharynx clear.  NECK:  Supple, no jugular venous distention. No thyroid enlargement, no tenderness.  LUNGS: Normal breath sounds bilaterally, no wheezing, rales,rhonchi or crepitation. No use of accessory muscles of respiration.  CARDIOVASCULAR: S1, S2 normal. No murmurs, rubs, or gallops.  ABDOMEN: Soft, nontender, nondistended. Bowel sounds present. No organomegaly or mass.  EXTREMITIES:  No pedal edema, cyanosis, or clubbing.  NEUROLOGIC: Cranial nerves II through XII are intact. Muscle strength 5/5 in all extremities. Sensation intact. Gait not checked.  PSYCHIATRIC: The patient is alert and oriented x 3.  SKIN: No obvious rash, lesion, or ulcer.   Physical Exam LABORATORY PANEL:   CBC Recent Labs  Lab 09/30/17 0449  WBC 11.5*  HGB 7.2*  HCT 21.6*  PLT 224   ------------------------------------------------------------------------------------------------------------------  Chemistries  Recent Labs  Lab 09/28/17 0856 09/29/17 1215 09/30/17 0449  NA 132* 133* 137  K 4.9 5.2* 4.2  CL 99* 102 101  CO2 18* 17* 23  GLUCOSE 137* 177* 150*  BUN 65* 78* 79*  CREATININE 2.81* 2.79* 2.85*  CALCIUM 9.4 9.0 8.6*  MG 2.0  --   --   AST  --  275*  --   ALT  --  11*  --   ALKPHOS  --  115  --   BILITOT  --  0.9  --    ------------------------------------------------------------------------------------------------------------------  Cardiac Enzymes Recent Labs  Lab 09/29/17 2139 09/30/17 0210  TROPONINI 33.67* 35.14*   ------------------------------------------------------------------------------------------------------------------  RADIOLOGY:  Ct Head Wo Contrast  Result Date: 09/30/2017 CLINICAL DATA:  Altered mental status. Left-sided weakness. Negative acute study 2 days ago. EXAM: CT HEAD WITHOUT CONTRAST TECHNIQUE: Contiguous axial images were obtained from the base of the skull through the vertex without intravenous contrast. COMPARISON:  09/28/2017.  05/30/2017.  MRI 03/08/2016. FINDINGS: Brain: No definite acute finding by CT. Chronic small-vessel changes remain evident within the pons. No focal cerebellar insult. Old infarctions remain visible within the thalami, more extensive on  the right than the left. Widespread chronic small-vessel ischemic changes main evident within the cerebral hemispheric white matter, progressive over time. On today's study, 1  could question a small area of cortical low-density in the right occipital lobe that could represent a small occipital infarction, but this is not definite. No sign of mass lesion, hemorrhage, hydrocephalus or extra-axial collection. Vascular: There is atherosclerotic calcification of the major vessels at the base of the brain. Skull: Negative Sinuses/Orbits: Clear/normal Other: None IMPRESSION: No definite acute finding. Extensive chronic ischemic changes throughout the brain, progressive over time. 1 could question a small area of cortical low-density in the right occipital lobe that could represent a recent right occipital infarction, but this is not definite. Electronically Signed   By: Nelson Chimes M.D.   On: 09/30/2017 13:48   Dg Chest Port 1 View  Result Date: 09/30/2017 CLINICAL DATA:  Respiratory failure EXAM: PORTABLE CHEST 1 VIEW COMPARISON:  09/29/2017 FINDINGS: Cardiac shadow is stable. Right subclavian central line is again seen and stable. Endotracheal tube is noted in satisfactory position. Lungs are well aerated bilaterally with improved aeration particularly in the right lung base. Some mild increased density is noted to the right of the mediastinum in the upper lobe likely related to patient rotation to the right. No new focal infiltrate is seen. IMPRESSION: Overall improved aeration. Electronically Signed   By: Inez Catalina M.D.   On: 09/30/2017 06:51   Dg Chest Port 1 View  Result Date: 09/29/2017 CLINICAL DATA:  84 year old male post endotracheal tube placement. Initial encounter. EXAM: PORTABLE CHEST 1 VIEW COMPARISON:  05/29/2017 chest x-ray. FINDINGS: Endotracheal tube tip 3 cm above the carina. Right central line tip distal superior vena cava level. No pneumothorax. Asymmetric airspace disease suggestive of pulmonary edema with small right-sided pleural effusion. Top-normal to slightly enlarged heart. Calcified tortuous aorta. IMPRESSION: Endotracheal tube tip 3 cm above the carina.  Right central line tip distal superior vena cava level. Asymmetric airspace disease suggestive of pulmonary edema with small right-sided pleural effusion. Top-normal to slightly enlarged heart. Aortic Atherosclerosis (ICD10-I70.0). Electronically Signed   By: Genia Del M.D.   On: 09/29/2017 11:48    ASSESSMENT AND PLAN:  Keith Woods a89 y.o.malewith a known history per below, history of neurogenic syncope secondary to Parkinson's disease, chronic orthostatic hypotension on [place], presented to the hospital with acute urinary obstruction/inability to void, noted recurrent hematuria  * acute Hypoxic respiratory due to acute on chronic systolic CHF with volume overload suspected IVF  -intubated and on the vent -appreciate ICU attending input  *elevated troponin/Acute ACS /Demand ischemia ginve severe CMP EF 20-25% -spoke with RN--Cardiology consult will be placed by ICU MD -ASA per rectal  * Acute encephalopathy/?delirium--now intubated and on the vent -multifactorial -recent sx, lack of sleep, anemia, dehydration -CT head negative  *Acute urinary retention secondary to large bladder clot.  Status post cystoscopy with clot evacuation and fulguration POD # 3 -s/p CBI---now stopped -h/o Radiation cystitis  *Acute blood loss anemia secondary to hematuria. -  Hemoglobin at 8.8.  No need for transfusion.  -Baseline of 12  *Acute kidney injury over CKD stage III secondary to post renal obstruction and also ATN.  - We will increase fluids to 75 mL/h.  Monitor input and output. Close monitoring for fluid overload with patient ejection fraction being 30%. -creat 2.12--2.47--2.62--3.31--2.81--2.79 -Baseline creat 2.09  *chronic GERD without esophagitis PPI daily  *chronic Parkinson's disease On Sinemet at home  Pt has poor prognosis He is now  DNR  All the records are reviewed and case discussed with Care Management/Social Workerr. Management plans discussed  with the patient, family and they are in agreement.  CODE STATUS:DNR TOTAL TIME TAKING CARE OF THIS PATIENT: 35 minutes.     POSSIBLE D/C IN 3-5 DAYS, DEPENDING ON CLINICAL CONDITION.   Keith Woods M.D on 09/30/2017   Between 7am to 6pm - Pager - [phone]  After 6pm go to [email or web address] - password [email or web address] [facility]  Office  [phone]  CC: Primary care physician; [name], MD  Note: This dictation was prepared with Dragon dictation along with smaller phrase technology. Any transcriptional errors that result from this process are unintentional.

## 2017-09-30 NOTE — Progress Notes (Signed)
Pt taken to CT without difficulty. Upon getting back to room from CT, pt went into a. Fib with RVR. Order received from Dr. Alva Garnet to give metoprolol IVP. Afterwards, pt dropped pressure with SBP of 67. Order received to give pt 580ml bolus of NS. After bolus complete, pt remained in a. Fib with rates into the 140s and a SBP in the 80s. Order received to start amiodarone drip with bolus. Will continue to monitor.

## 2017-09-30 NOTE — Progress Notes (Signed)
*  PRELIMINARY RESULTS* Echocardiogram 2D Echocardiogram has been performed.  Keith Woods 09/30/2017, 2:37 PM

## 2017-09-30 NOTE — Consult Note (Signed)
Pharmacy Antibiotic Note  Kamoni Gentles is a 84 y.o. male admitted on 09/25/2017.  Pharmacy has been consulted for Zosyn dosing for aspiration PNA.  Plan: Continue Zosyn 3.375 IV EI every 12 hour based on CrCl <78ml/min   Height: 5\' 11"  (180.3 cm) Weight: 161 lb 2.5 oz (73.1 kg) IBW/kg (Calculated) : 75.3  Temp (24hrs), Avg:98.3 F (36.8 C), Min:97.3 F (36.3 C), Max:99 F (37.2 C)  Recent Labs  Lab 09/25/17 2108 09/26/17 0505 09/27/17 0751 09/28/17 0856 09/29/17 1215 09/30/17 0449  WBC 13.6* 12.7*  --   --  15.5* 11.5*  CREATININE 2.47* 2.62* 3.31* 2.81* 2.79* 2.85*    Estimated Creatinine Clearance: 17.8 mL/min (A) (by C-G formula based on SCr of 2.85 mg/dL (H)).    Allergies  Allergen Reactions  . Lipitor [Atorvastatin Calcium]     Leg pain  . Nsaids   . Statins     Leg pain    Antimicrobials this admission: 2/19 Zosyn>>   Dose adjustments this admission:  Microbiology results: 2/19  BCx: pending 2/19  Sputum/Resp: GPC, Gram (-) Coccobacilli  2/19 MRSA PCR: negative   Thank you for allowing pharmacy to be a part of this patient's care.  Pernell Dupre, PharmD, BCPS Clinical Pharmacist 09/30/2017 2:53 PM

## 2017-09-30 NOTE — Consult Note (Signed)
Cardiology Consultation:   Patient ID: Keith Woods; [id number]; March 12, 1926   Admit date: 09/25/2017 Date of Consult: 09/30/2017  Primary Care Provider: [name], MD Primary Cardiologist: Nelva Bush, MD Primary Electrophysiologist:  None   Patient Profile:   Keith Woods is a 84 y.o. male with a hx of coronary artery disease, ischemic cardiomyopathy, chronic kidney disease, and Parkinson's disease, who is being seen today for the evaluation of NSTEMI and acute on chronic systolic heart failure at the request of Simonds.  History of Present Illness:   [name] is well known to me from the outpatient setting.  He was admitted on 09/25/17 with urinary obstruction.  Subsequent workup revealed gross hematuria felt to be due to radiation cystitis.  He underwent cystoscopy.  Postoperative course was complicated by delirium and worsening dyspnea that ultimately led to intubation yesterday.  Around the time of his decompensation, troponin was checked and was found to be elevated (most recently 35).  Keith Woods remains intubated and sedated at this time.  Today, he went into atrial fibrillation with rapid ventricular response.  He received IV metoprolol with resultant hypotension.  Amiodarone infusion was subsequently started with conversion back to sinus rhythm.  Keith Woods has remained hypotensive.  Past Medical History:  Diagnosis Date  . Chronic kidney disease   . Coronary artery disease    NONOBSTRUCTIVE by cath 2008  . GERD (gastroesophageal reflux disease)   . History of prostate cancer   . History of transient ischemic attack (TIA)   . Hyperlipidemia   . Hypertension   . LBBB (left bundle branch block)   . Stroke ([place]) 02/2016   Hemorrhagic  . Syncope and collapse     Past Surgical History:  Procedure Laterality Date  . CARDIAC CATHETERIZATION  08/28/2006   EF 60%  . CYSTOSCOPY WITH STENT PLACEMENT Bilateral 09/26/2017   Procedure: possible  CYSTOSCOPY WITH STENT PLACEMENT;  Surgeon: Lucas Mallow, MD;  Location: [facility] OR[facility];  Service: Urology;  Laterality: Bilateral;  . CYSTOSCOPY/RETROGRADE/URETEROSCOPY  09/26/2017   Procedure: RETROGRADE PYELOGRAM;  Surgeon: Lucas Mallow, MD;  Location: [facility] OR[facility];  Service: Urology;;  . FULGURATION OF BLADDER TUMOR N/A 09/26/2017   Procedure: POSSIBLE FULGURATION;  Surgeon: Lucas Mallow, MD;  Location: [facility];  Service: Urology;  Laterality: N/A;  . HEMATOMA EVACUATION N/A 09/26/2017   Procedure: EVACUATION HEMATOMA;  Surgeon: Lucas Mallow, MD;  Location: [facility];  Service: Urology;  Laterality: N/A;  . TONSILLECTOMY    . TRANSTHORACIC ECHOCARDIOGRAM  10/10/2008   EF 55%  . TRANSURETHRAL RESECTION OF BLADDER TUMOR N/A 09/26/2017   Procedure: TRANSURETHRAL RESECTION OF BLADDER TUMOR (TURBT);  Surgeon: Lucas Mallow, MD;  Location: [facility];  Service: Urology;  Laterality: N/A;  . TRANSURETHRAL RESECTION OF PROSTATE  09/26/2017   Procedure: TRANSURETHRAL RESECTION OF THE PROSTATE (TURP);  Surgeon: Lucas Mallow, MD;  Location: [facility];  Service: Urology;;     Inpatient Medications: Scheduled Meds: . chlorhexidine gluconate (MEDLINE KIT)  15 mL Mouth Rinse BID  . famotidine (PEPCID) IV  20 mg Intravenous Q24H  . heparin injection (subcutaneous)  5,000 Units Subcutaneous Q12H  . mouth rinse  15 mL Mouth Rinse QID   Continuous Infusions: . sodium chloride    . amiodarone 60 mg/hr (09/30/17 1514)   Followed by  . amiodarone    . piperacillin-tazobactam (ZOSYN)  IV Stopped (09/30/17 1350)  .  sodium bicarbonate  infusion 1000  mL 50 mL/hr at 09/30/17 0848   PRN Meds: acetaminophen **OR** acetaminophen, fentaNYL (SUBLIMAZE) injection, metoprolol tartrate, midazolam, [DISCONTINUED] ondansetron **OR** ondansetron (ZOFRAN) IV  Allergies:        Physical Exam/Data:   Vitals:   09/30/17 1515 09/30/17 1537 09/30/17 1545 09/30/17 1615  BP: (!) 83/64 (!) 78/59 (!)  80/60 (!) 80/70  Pulse: 60  79 86  Resp: 20 (!) 21 20 (!) 21  Temp:    (!) 97.5 F (36.4 C)  TempSrc:    Axillary  SpO2: 100%  100% 100%  Weight:      Height:        Intake/Output Summary (Last 24 hours) at 09/30/2017 1713 Last data filed at 09/30/2017 1315 Gross per 24 hour  Intake 1073.33 ml  Output 800 ml  Net 273.33 ml   Filed Weights   09/25/17 2058 09/29/17 1100  Weight: 171 lb (77.6 kg) 161 lb 2.5 oz (73.1 kg)   Body mass index is 22.48 kg/m.  General: Ill-appearing elderly man lying in bed.  He is intubated and sedated.  Relevant CV Studies: Echocardiogram (09/30/17): - Left ventricle: The cavity size was mildly dilated. Wall   thickness was increased in a pattern of mild LVH. There was   moderate focal basal hypertrophy. Systolic function was severely   reduced. The estimated ejection fraction was in the range of 10%   to 15%. Diffuse hypokinesis. The study is not technically   sufficient to allow evaluation of LV diastolic function. - Aortic valve: Mildly thickened leaflets. There was trivial   regurgitation. - Mitral valve: There was moderate regurgitation. - Left atrium: The atrium was mildly dilated. - Right ventricle: The cavity size was normal. Systolic function   was moderately reduced. - Pericardium, extracardiac: There was a left pleural effusion.   Laboratory Data:  Chemistry Recent Labs  Lab 09/28/17 0856 09/29/17 1215 09/30/17 0449  NA 132* 133* 137  K 4.9 5.2* 4.2  CL 99* 102 101  CO2 18* 17* 23  GLUCOSE 137* 177* 150*  BUN 65* 78* 79*  CREATININE 2.81* 2.79* 2.85*  CALCIUM 9.4 9.0 8.6*  GFRNONAA 18* 19* 18*  GFRAA 21* 21* 21*  ANIONGAP 15 14 13     Recent Labs  Lab 09/29/17 1215  PROT 7.2  ALBUMIN 3.2*  AST 275*  ALT 11*  ALKPHOS 115  BILITOT 0.9   Hematology Recent Labs  Lab 09/26/17 0505  09/28/17 0856 09/29/17 1215 09/30/17 0449  WBC 12.7*  --   --  15.5* 11.5*  RBC 3.44*  --   --  2.68* 2.25*  HGB 11.2*   < >  8.7* 8.7* 7.2*  HCT 32.7*   < > 25.7* 25.9* 21.6*  MCV 95.1  --   --  96.8 96.1  MCH 32.5  --   --  32.6 32.1  MCHC 34.2  --   --  33.7 33.4  RDW 15.0*  --   --  15.5* 15.7*  PLT 228  --   --  249 224   < > = values in this interval not displayed.   Cardiac Enzymes Recent Labs  Lab 09/26/17 0505 09/26/17 1049 09/29/17 1635 09/29/17 2139 09/30/17 0210  TROPONINI 0.10* 0.14* 31.80* 33.67* 35.14*   No results for input(s): TROPIPOC in the last 168 hours.  BNP Recent Labs  Lab 09/29/17 1215  BNP >4,500.0*    DDimer No results for input(s): DDIMER in the last 168 hours.  Radiology/Studies:  Ct Head Wo  Contrast  Result Date: 09/30/2017 CLINICAL DATA:  Altered mental status. Left-sided weakness. Negative acute study 2 days ago. EXAM: CT HEAD WITHOUT CONTRAST TECHNIQUE: Contiguous axial images were obtained from the base of the skull through the vertex without intravenous contrast. COMPARISON:  09/28/2017.  05/30/2017.  MRI 03/08/2016. FINDINGS: Brain: No definite acute finding by CT. Chronic small-vessel changes remain evident within the pons. No focal cerebellar insult. Old infarctions remain visible within the thalami, more extensive on the right than the left. Widespread chronic small-vessel ischemic changes main evident within the cerebral hemispheric white matter, progressive over time. On today's study, 1 could question a small area of cortical low-density in the right occipital lobe that could represent a small occipital infarction, but this is not definite. No sign of mass lesion, hemorrhage, hydrocephalus or extra-axial collection. Vascular: There is atherosclerotic calcification of the major vessels at the base of the brain. Skull: Negative Sinuses/Orbits: Clear/normal Other: None IMPRESSION: No definite acute finding. Extensive chronic ischemic changes throughout the brain, progressive over time. 1 could question a small area of cortical low-density in the right occipital lobe that  could represent a recent right occipital infarction, but this is not definite. Electronically Signed   By: Nelson Chimes M.D.   On: 09/30/2017 13:48   Ct Head Wo Contrast  Result Date: 09/28/2017 CLINICAL DATA:  84 year old male with acute confusion and altered mental status. EXAM: CT HEAD WITHOUT CONTRAST TECHNIQUE: Contiguous axial images were obtained from the base of the skull through the vertex without intravenous contrast. COMPARISON:  05/30/2017 CT and prior exams. FINDINGS: Brain: No evidence of acute infarction, hemorrhage, hydrocephalus, extra-axial collection or mass lesion/mass effect. Moderate-severe chronic small-vessel white matter ischemic changes and remote basal ganglia and thalamic infarcts again noted. Vascular: Atherosclerotic calcifications noted. Skull: Normal. Negative for fracture or focal lesion. Sinuses/Orbits: No acute finding. Other: None. IMPRESSION: 1. No evidence of acute intracranial abnormality 2. Moderate to severe chronic small-vessel white matter ischemic changes and remote infarcts as described. Electronically Signed   By: Margarette Canada M.D.   On: 09/28/2017 15:16   Dg Chest Port 1 View  Result Date: 09/30/2017 CLINICAL DATA:  Respiratory failure EXAM: PORTABLE CHEST 1 VIEW COMPARISON:  09/29/2017 FINDINGS: Cardiac shadow is stable. Right subclavian central line is again seen and stable. Endotracheal tube is noted in satisfactory position. Lungs are well aerated bilaterally with improved aeration particularly in the right lung base. Some mild increased density is noted to the right of the mediastinum in the upper lobe likely related to patient rotation to the right. No new focal infiltrate is seen. IMPRESSION: Overall improved aeration. Electronically Signed   By: Inez Catalina M.D.   On: 09/30/2017 06:51   Dg Chest Port 1 View  Result Date: 09/29/2017 CLINICAL DATA:  84 year old male post endotracheal tube placement. Initial encounter. EXAM: PORTABLE CHEST 1 VIEW  COMPARISON:  05/29/2017 chest x-ray. FINDINGS: Endotracheal tube tip 3 cm above the carina. Right central line tip distal superior vena cava level. No pneumothorax. Asymmetric airspace disease suggestive of pulmonary edema with small right-sided pleural effusion. Top-normal to slightly enlarged heart. Calcified tortuous aorta. IMPRESSION: Endotracheal tube tip 3 cm above the carina. Right central line tip distal superior vena cava level. Asymmetric airspace disease suggestive of pulmonary edema with small right-sided pleural effusion. Top-normal to slightly enlarged heart. Aortic Atherosclerosis (ICD10-I70.0). Electronically Signed   By: Genia Del M.D.   On: 09/29/2017 11:48    Assessment and Plan:   Elevated troponin and acute  on chronic systolic heart failure Mr. Puebla is critically ill with multiorgan failure, including further decline in his LVEF.  He is not a candidate for invasive measures given his acute on chronic kidney injury and recent gross hematuria.  His prognosis is poor.  I have spoken at length with his family, who state that Mr. Dilger would not wish to remain on indefinite life support.  He is currently DNR.  I think it would be reasonable to pursue comfort care measures.  Several additional family members are currently traveling to [place], hopefully to arrive by tomorrow.  Depending on Mr. Thon his respiratory status, terminal extubation and comfort care in-house versus transfer to home/inpatient hospice could be considered based on his clinical course.  I have cautioned his family that he may be too ill for transport out of the hospital.  I will defer ongoing palliative care discussions to Drs. Simonds and [employer].  In the meantime, it is reasonable to continue with IV amiodarone to maintain sinus rhythm.  I recommend against any additional cardiac testing or interventions.  For questions or updates, please contact [place] Please consult [email or web address] for contact info  under Cardiology/STEMI.   Signed, Nelva Bush, MD  09/30/2017 5:13 PM

## 2017-09-30 NOTE — Progress Notes (Signed)
Pt was transported to CT from CCU and back while on the ventilator.

## 2017-09-30 NOTE — Consult Note (Signed)
  Amiodarone Drug - Drug Interaction Consult Note  Recommendations: No adjustments are needed at this time.      Amiodarone is metabolized by the cytochrome P450 system and therefore has the potential to cause many drug interactions. Amiodarone has an average plasma half-life of 50 days (range 20 to 100 days).   There is potential for drug interactions to occur several weeks or months after stopping treatment and the onset of drug interactions may be slow after initiating amiodarone.   []  Statins: Increased risk of myopathy. Simvastatin- restrict dose to 20mg  daily. Other statins: counsel patients to report any muscle pain or weakness immediately.  []  Anticoagulants: Amiodarone can increase anticoagulant effect. Consider warfarin dose reduction. Patients should be monitored closely and the dose of anticoagulant altered accordingly, remembering that amiodarone levels take several weeks to stabilize.  []  Antiepileptics: Amiodarone can increase plasma concentration of phenytoin, the dose should be reduced. Note that small changes in phenytoin dose can result in large changes in levels. Monitor patient and counsel on signs of toxicity.  []  Beta blockers: increased risk of bradycardia, AV block and myocardial depression. Sotalol - avoid concomitant use.  []   Calcium channel blockers (diltiazem and verapamil): increased risk of bradycardia, AV block and myocardial depression.  []   Cyclosporine: Amiodarone increases levels of cyclosporine. Reduced dose of cyclosporine is recommended.  []  Digoxin dose should be halved when amiodarone is started.  []  Diuretics: increased risk of cardiotoxicity if hypokalemia occurs.  []  Oral hypoglycemic agents (glyburide, glipizide, glimepiride): increased risk of hypoglycemia. Patient's glucose levels should be monitored closely when initiating amiodarone therapy.   [x]  Drugs that prolong the QT interval:  Torsades de pointes risk may be increased with  concurrent use - avoid if possible.  Monitor QTc, also keep magnesium/potassium WNL if concurrent therapy can't be avoided. Marland Kitchen Antibiotics: e.g. fluoroquinolones, erythromycin. . Antiarrhythmics: e.g. quinidine, procainamide, disopyramide, sotalol. . Antipsychotics: e.g. phenothiazines, haloperidol.  . Lithium, tricyclic antidepressants, and methadone.  Thank You,  Pernell Dupre, PharmD, BCPS Clinical Pharmacist 09/30/2017 3:15 PM

## 2017-09-30 NOTE — Progress Notes (Addendum)
Initial Nutrition Assessment  DOCUMENTATION CODES:   Not applicable  INTERVENTION:  OGT was unable to be placed at bedside. Plan is to consider IR placement of OGT or NGT 2/21.  Once enteral access placed and confirmed, recommend initiating Vital AF 1.2 at 55 mL/hr (1320 mL goal daily volume). Provides 1584 kcal, 99 grams of protein, 1069 mL H2O daily. With current rate of NaHCO3-D5 provides 1788 kcal.  NUTRITION DIAGNOSIS:   Inadequate oral intake related to inability to eat as evidenced by NPO status.  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 On 2/19 patient had worsening delirium and respiratory distress, transferred to ICU and required emergent intubation.   -Pt is minimally responsive on minimal sedation. Reduced LUE movement. Had echocardiogram today. CT head today with no definite acute findings.  No family members present at time of RD assessment. He is not currently on any continuous sedation. Per chart he was 172.5 lbs on 04/01/2017 and has lost 11.3 lbs (6.6% body weight) over the past 6 months, which is not significant for time frame.  OGT could not be placed at bedside. Plan is to consider IR placement of OGT or NGT on 2/21 per chart.  MAP: 57-82 mmHg  Patient is currently intubated on ventilator support MV: 11 L/min Temp (24hrs), Avg:98 F (36.7 C), Min:97.3 F (36.3 C), Max:99 F (37.2 C)  Propofol: N/A  Medications reviewed and include: famotidine, amiodarone gtt, Zosyn, sodium  bicarbonate 150 mEq in D5 at 50 mL/hr (60 grams dextrose, 204 kcal daily).  Labs reviewed: BUN 79, Creatinine 2.85.  Patient does not meet criteria for malnutrition at this time.  Discussed with RN and on rounds.  NUTRITION - FOCUSED PHYSICAL EXAM:    Most Recent Value  Orbital Region  No depletion  Upper Arm Region  Mild depletion  Thoracic and Lumbar Region  No depletion  Buccal Region  Unable to assess  Temple Region  Mild depletion  Clavicle Bone Region  No depletion  Clavicle and Acromion Bone Region  No depletion  Scapular Bone Region  Unable to assess  Dorsal Hand  No depletion  Patellar Region  Mild depletion  Anterior Thigh Region  Mild depletion  Posterior Calf Region  Mild depletion  Edema (RD Assessment)  None  Hair  Reviewed  Eyes  Unable to assess  Mouth  Unable to assess  Skin  Reviewed  Nails  Reviewed     Diet Order:  Fall precautions  EDUCATION NEEDS:   No education needs have been identified at this time  Skin:  Skin Assessment: Reviewed RN Assessment(clammy; ecchymosis to abdomen, arms, and legs)  Last BM:  09/25/2017  Height:   Ht Readings from Last 1 Encounters:  09/29/17 5\' 11"  (1.803 m)    Weight:   Wt Readings from Last 1 Encounters:  09/29/17 161 lb 2.5 oz (73.1 kg)    Ideal Body Weight:  78.2 kg  BMI:  Body mass index is 22.48 kg/m.  Estimated Nutritional Needs:   Kcal:  3009 (PSU 2003b  w/ MSJ 1419, Ve 11, Tmax 37.2)  Protein:  88-110 grams (1.2-1.5 grams/kg)  Fluid:  1.8 L/day (25 mL/kg)  Willey Blade, MS, RD, LDN Office: [phone] Pager: [phone] After Hours/Weekend Pager: [phone]

## 2017-09-30 NOTE — Progress Notes (Signed)
PULMONARY / CRITICAL CARE MEDICINE   Name: Keith Woods MRN: [id number] DOB: 05/27/1926    ADMISSION DATE:  09/25/2017 CONSULTATION DATE:  09/29/17  PT PROFILE: 81 M with history of Parkinson's, ischemic cardiomyopathy (LVEF 20-25% by echocardiogram 05/30/17), chronic orthostatic hypotension adm 02/16 to hospitalist service with acute urinary retention and AKI.  Transferred to ICU 2/19 with progressive alteration in mental status and dyspnea  MAJOR EVENTS/TEST RESULTS: 02/15 admitted to hospitalist service as above 02/16 CTAP: Distended urinary bladder filled with high-density material, likely blood clot in the setting of hematuria. Large bladder mass is not entirely excluded based on imaging findings alone. This could be further assessed with cystoscopy. Mild bilateral hydroureteronephrosis 02/16 Cystourethroscopy with limited transurethral resection of the prostate, clot evacuation, fulguration (Dr Link Snuffer) 02/18 CT head: No acute intracranial abnormality.  Moderate to severe chronic small vessel white matter ischemic changes and remote infarcts 02/19 worsening delirium and respiratory distress.  Transferred to ICU and required intubation shortly after arrival 02/20 Trop I 35.14. BNP < 4500. Minimally responsive on minimal sedation. Reduced LUE movement. Family updated and new findings conveyed. Family desired continued support but recognize very guarded prognosis 02/20 Transfuse one unit PRBCs for Hgb 7.2 02/20 Developed AFRVR - PRN metoprolol 02/20 Echocardiogram: 02/20 CT head: No definite acute finding. Extensive chronic ischemic changes throughout the brain, progressive over time. One could question a small area of cortical low-density in the right occipital lobe that could represent a recent right occipital infarction, but this is not definite.  INDWELLING DEVICES:: ETT 02/19 >>  R Bouse CVL 02/19 >>   MICRO DATA: MRSA PCR 02/19 >> NEG Urine 02/19 >> NEG Resp 02/19 >>   Blood 02/19 >>   PCT 02/19: 1.04, 1.57,   ANTIMICROBIALS:  Pip-tazo 02/19 >>     SUBJECTIVE:  RASS -4. Not F/C  VITAL SIGNS: BP 105/73   Pulse 94   Temp (!) 97.3 F (36.3 C) (Axillary)   Resp 20   Ht 5\' 11"  (1.803 m)   Wt 73.1 kg (161 lb 2.5 oz)   SpO2 99%   BMI 22.48 kg/m   HEMODYNAMICS:    VENTILATOR SETTINGS: Vent Mode: PRVC FiO2 (%):  [50 %] 50 % Set Rate:  [16 bmp] 16 bmp Vt Set:  [550 mL] 550 mL PEEP:  [5 cmH20] 5 cmH20  INTAKE / OUTPUT: I/O last 3 completed shifts: In: 2033.3 [P.O.:50; I.V.:1733.3; Other:50; IV Piggyback:200] Out: 1035 [DZHGD:[id number]]  PHYSICAL EXAMINATION: General: Minimally responsive, Not F/C Neuro: CNs intact, withdraws RUE, BLE. Minimal movement of LUE, sclerae white Cardiovascular: Tachy, IRIR, no M noted Lungs: Clear anteriorly Abdomen: Soft, NT, diminished BS Extremities: Cool without edema, diminished pedal pulses Skin: No lesions noted  LABS:  BMET Recent Labs  Lab 09/28/17 0856 09/29/17 1215 09/30/17 0449  NA 132* 133* 137  K 4.9 5.2* 4.2  CL 99* 102 101  CO2 18* 17* 23  BUN 65* 78* 79*  CREATININE 2.81* 2.79* 2.85*  GLUCOSE 137* 177* 150*    Electrolytes Recent Labs  Lab 09/28/17 0856 09/29/17 1215 09/30/17 0449  CALCIUM 9.4 9.0 8.6*  MG 2.0  --   --     CBC Recent Labs  Lab 09/26/17 0505  09/28/17 0856 09/29/17 1215 09/30/17 0449  WBC 12.7*  --   --  15.5* 11.5*  HGB 11.2*   < > 8.7* 8.7* 7.2*  HCT 32.7*   < > 25.7* 25.9* 21.6*  PLT 228  --   --  249 224   < > = values in this interval not displayed.    Coag's No results for input(s): APTT, INR in the last 168 hours.  Sepsis Markers Recent Labs  Lab 09/29/17 1215 09/30/17 0449  PROCALCITON 1.04 1.57    ABG Recent Labs  Lab 09/29/17 1024 09/29/17 1200  PHART 7.44 7.38  PCO2ART 27* 32  PO2ART 65* 229*    Liver Enzymes Recent Labs  Lab 09/29/17 1215  AST 275*  ALT 11*  ALKPHOS 115  BILITOT 0.9  ALBUMIN 3.2*     Cardiac Enzymes Recent Labs  Lab 09/29/17 1635 09/29/17 2139 09/30/17 0210  TROPONINI 31.80* 33.67* 35.14*    Glucose Recent Labs  Lab 09/29/17 1022 09/29/17 1043 09/29/17 1155  GLUCAP 137* 156* 174*    CXR: improved edema pattern   ASSESSMENT / PLAN:  PULMONARY A: Acute hypoxemic respiratory failure Respiratory distress Asymmetric pulmonary edema, improved with positive pressure ventilation P:   Cont full vent support - settings reviewed and/or adjusted Cont vent bundle Daily SBT if/when meets criteria   CARDIOVASCULAR A:  Severe ischemic cardiomyopathy (LVEF 20-25%) Chronic orthostatic hypotension Acute MI New onset AFRVR P:  Monitor BP and rhythm Recheck cardiac markers BNP 02/21 Cont midodrine Low dose IV metoprolol to maintain HR < 115/min Consider amiodarone DNR - see below  RENAL A:   Urinary retention s/p cystourethroscopy with limited TURP AKI CKD Chronic metabolic acidosis P:   Monitor BMET intermittently Monitor I/Os Correct electrolytes as indicated  Cont HCO3 infusion Not a candidate for HD - see below  GASTROINTESTINAL A:   Possible dysphagia with aspiration Difficult OGT placement P:   SUP: IV famotidine Consider IR to place OGT or NGT 02/21 Consider TF protocol 02/21  HEMATOLOGIC A:   Acute blood loss anemia (urinary tract) - not actively bleeding presently P:  DVT px: SQ heparin Monitor CBC intermittently Transfuse per usual guidelines   INFECTIOUS A:   Possible severe sepsis Possible aspiration PNA Possible UTI P:   Monitor temp, WBC count Micro and abx as above   ENDOCRINE A:   Stress induced hyperglycemia without history of DM P:   Monitor glucose on chemistry panels Consider SSI for glucose > 180  NEUROLOGIC A:   Acute encephalopathy ICU/ventilator associated discomfort P:   RASS goal: -1, -2 PAD protocol -intermittent fentanyl, midazolam   FAMILY/GOALS OF CARE: 02/19 Wife, daughter and  other family members updated in detail.  Goals of care discussed.  Agreed to no escalation (no CPR/ACLS, no HD) and that our current level of care would not be extended beyond a few days unless there was clear evidence of improvement. 02/20 Family updated. Wish to continue current support for now    CCM time: 50 mins The above time includes time spent in consultation with patient and/or family members and reviewing care plan on multidisciplinary rounds  Merton Border, MD PCCM service Mobile [phone] Pager [phone] 09/30/2017, 2:25 PM

## 2017-10-01 ENCOUNTER — Inpatient Hospital Stay: Payer: Medicare Other

## 2017-10-01 DIAGNOSIS — R54 Age-related physical debility: Secondary | ICD-10-CM

## 2017-10-01 DIAGNOSIS — Z9911 Dependence on respirator [ventilator] status: Secondary | ICD-10-CM

## 2017-10-01 DIAGNOSIS — I420 Dilated cardiomyopathy: Secondary | ICD-10-CM

## 2017-10-01 DIAGNOSIS — I255 Ischemic cardiomyopathy: Secondary | ICD-10-CM

## 2017-10-01 LAB — COMPREHENSIVE METABOLIC PANEL
ALBUMIN: 2.6 g/dL — AB (ref 3.5–5.0)
ALK PHOS: 153 U/L — AB (ref 38–126)
ALT: 68 U/L — AB (ref 17–63)
AST: 271 U/L — AB (ref 15–41)
Anion gap: 13 (ref 5–15)
BILIRUBIN TOTAL: 0.6 mg/dL (ref 0.3–1.2)
BUN: 87 mg/dL — AB (ref 6–20)
CALCIUM: 8.3 mg/dL — AB (ref 8.9–10.3)
CO2: 27 mmol/L (ref 22–32)
CREATININE: 3.06 mg/dL — AB (ref 0.61–1.24)
Chloride: 100 mmol/L — ABNORMAL LOW (ref 101–111)
GFR calc Af Amer: 19 mL/min — ABNORMAL LOW (ref >60)
GFR calc non Af Amer: 17 mL/min — ABNORMAL LOW (ref >60)
GLUCOSE: 152 mg/dL — AB (ref 65–99)
Potassium: 4 mmol/L (ref 3.5–5.1)
Sodium: 140 mmol/L (ref 135–145)
TOTAL PROTEIN: 6.3 g/dL — AB (ref 6.5–8.1)

## 2017-10-01 LAB — CBC
HEMATOCRIT: 22.3 % — AB (ref 40.0–52.0)
HEMOGLOBIN: 7.4 g/dL — AB (ref 13.0–18.0)
MCH: 32 pg (ref 26.0–34.0)
MCHC: 33.1 g/dL (ref 32.0–36.0)
MCV: 96.5 fL (ref 80.0–100.0)
Platelets: 237 10*3/uL (ref 150–440)
RBC: 2.31 MIL/uL — ABNORMAL LOW (ref 4.40–5.90)
RDW: 15.5 % — ABNORMAL HIGH (ref 11.5–14.5)
WBC: 10.4 10*3/uL (ref 3.8–10.6)

## 2017-10-01 LAB — URINE CULTURE: CULTURE: NO GROWTH

## 2017-10-01 LAB — CULTURE, RESPIRATORY W GRAM STAIN

## 2017-10-01 LAB — PROCALCITONIN: PROCALCITONIN: 1.49 ng/mL

## 2017-10-01 LAB — MAGNESIUM: Magnesium: 2.3 mg/dL (ref 1.7–2.4)

## 2017-10-01 LAB — CULTURE, RESPIRATORY: CULTURE: NORMAL

## 2017-10-01 LAB — PHOSPHORUS: Phosphorus: 4.4 mg/dL (ref 2.5–4.6)

## 2017-10-01 LAB — BRAIN NATRIURETIC PEPTIDE: B NATRIURETIC PEPTIDE 5: 3015 pg/mL — AB (ref 0.0–100.0)

## 2017-10-01 LAB — PROTIME-INR
INR: 1.36
Prothrombin Time: 16.7 seconds — ABNORMAL HIGH (ref 11.4–15.2)

## 2017-10-01 LAB — TROPONIN I: Troponin I: 29.94 ng/mL (ref <0.03)

## 2017-10-01 LAB — GLUCOSE, CAPILLARY
Glucose-Capillary: 132 mg/dL — ABNORMAL HIGH (ref 65–99)
Glucose-Capillary: 133 mg/dL — ABNORMAL HIGH (ref 65–99)

## 2017-10-01 MED ORDER — MORPHINE SULFATE (PF) 2 MG/ML IV SOLN
2.0000 mg | INTRAVENOUS | Status: DC | PRN
  Filled 2017-10-01: qty 1

## 2017-10-01 MED ORDER — MORPHINE SULFATE (PF) 2 MG/ML IV SOLN
2.0000 mg | INTRAVENOUS | Status: DC | PRN
  Administered 2017-10-01: 2 mg via INTRAVENOUS
  Administered 2017-10-01: 1 mg via INTRAVENOUS
  Administered 2017-10-02 (×2): 2 mg via INTRAVENOUS
  Filled 2017-10-01 (×5): qty 1

## 2017-10-01 MED ORDER — ORAL CARE MOUTH RINSE
15.0000 mL | OROMUCOSAL | Status: DC
  Administered 2017-10-01 (×3): 15 mL via OROMUCOSAL

## 2017-10-01 MED ORDER — CHLORHEXIDINE GLUCONATE 0.12% ORAL RINSE (MEDLINE KIT)
15.0000 mL | Freq: Two times a day (BID) | OROMUCOSAL | Status: DC
  Administered 2017-10-01: 15 mL via OROMUCOSAL

## 2017-10-01 MED ORDER — LORAZEPAM 2 MG/ML IJ SOLN: 1.0000 mg | INTRAMUSCULAR | Status: DC | PRN

## 2017-10-01 MED ORDER — FUROSEMIDE 10 MG/ML IJ SOLN
80.0000 mg | Freq: Once | INTRAMUSCULAR | Status: AC
Start: 2017-10-01 — End: 2017-10-01
  Administered 2017-10-01: 80 mg via INTRAVENOUS
  Filled 2017-10-01: qty 8

## 2017-10-01 MED ORDER — SODIUM CHLORIDE 0.9% FLUSH
10.0000 mL | Freq: Two times a day (BID) | INTRAVENOUS | Status: DC
  Administered 2017-10-01: 10 mL
  Administered 2017-10-01: 20 mL
  Administered 2017-10-02: 30 mL

## 2017-10-01 MED ORDER — SODIUM CHLORIDE 0.9% FLUSH: 10.0000 mL | INTRAVENOUS | Status: DC | PRN

## 2017-10-01 NOTE — Progress Notes (Signed)
[facility] [place] NAME: Keith Woods    MR#:  [id number]  DATE OF BIRTH:  October 07, 1925  SUBJECTIVE:  CHIEF COMPLAINT:   Chief Complaint  Patient presents with  . Near Syncope  Plans for hospice/comfort care going forward  REVIEW OF SYSTEMS:  CONSTITUTIONAL: No fever, fatigue or weakness.  EYES: No blurred or double vision.  EARS, NOSE, AND THROAT: No tinnitus or ear pain.  RESPIRATORY: No cough, shortness of breath, wheezing or hemoptysis.  CARDIOVASCULAR: No chest pain, orthopnea, edema.  GASTROINTESTINAL: No nausea, vomiting, diarrhea or abdominal pain.  GENITOURINARY: No dysuria, hematuria.  ENDOCRINE: No polyuria, nocturia,  HEMATOLOGY: No anemia, easy bruising or bleeding SKIN: No rash or lesion. MUSCULOSKELETAL: No joint pain or arthritis.   NEUROLOGIC: No tingling, numbness, weakness.  PSYCHIATRY: No anxiety or depression.   ROS  DRUG ALLERGIES:   Allergies  Allergen Reactions  . Lipitor [Atorvastatin Calcium]     Leg pain  . Nsaids   . Statins     Leg pain    VITALS:  Blood pressure 94/75, pulse 79, temperature 98.4 F (36.9 C), temperature source Oral, resp. rate (!) 22, height 5\' 11"  (1.803 m), weight 73.1 kg (161 lb 2.5 oz), SpO2 99 %.  PHYSICAL EXAMINATION:  GENERAL:  84 y.o.-year-old patient lying in the bed with no acute distress.  EYES: Pupils equal, round, reactive to light and accommodation. No scleral icterus. Extraocular muscles intact.  HEENT: Head atraumatic, normocephalic. Oropharynx and nasopharynx clear.  NECK:  Supple, no jugular venous distention. No thyroid enlargement, no tenderness.  LUNGS: Normal breath sounds bilaterally, no wheezing, rales,rhonchi or crepitation. No use of accessory muscles of respiration.  CARDIOVASCULAR: S1, S2 normal. No murmurs, rubs, or gallops.  ABDOMEN: Soft, nontender, nondistended. Bowel sounds present. No organomegaly or mass.  EXTREMITIES: No pedal edema,  cyanosis, or clubbing.  NEUROLOGIC: Cranial nerves II through XII are intact. Muscle strength 5/5 in all extremities. Sensation intact. Gait not checked.  PSYCHIATRIC: The patient is alert and oriented x 3.  SKIN: No obvious rash, lesion, or ulcer.   Physical Exam LABORATORY PANEL:   CBC Recent Labs  Lab 10/01/17 0423  WBC 10.4  HGB 7.4*  HCT 22.3*  PLT 237   ------------------------------------------------------------------------------------------------------------------  Chemistries  Recent Labs  Lab 10/01/17 0423  NA 140  K 4.0  CL 100*  CO2 27  GLUCOSE 152*  BUN 87*  CREATININE 3.06*  CALCIUM 8.3*  MG 2.3  AST 271*  ALT 68*  ALKPHOS 153*  BILITOT 0.6   ------------------------------------------------------------------------------------------------------------------  Cardiac Enzymes Recent Labs  Lab 09/30/17 0210 10/01/17 0423  TROPONINI 35.14* 29.94*   ------------------------------------------------------------------------------------------------------------------  RADIOLOGY:  Ct Head Wo Contrast  Result Date: 09/30/2017 CLINICAL DATA:  Altered mental status. Left-sided weakness. Negative acute study 2 days ago. EXAM: CT HEAD WITHOUT CONTRAST TECHNIQUE: Contiguous axial images were obtained from the base of the skull through the vertex without intravenous contrast. COMPARISON:  09/28/2017.  05/30/2017.  MRI 03/08/2016. FINDINGS: Brain: No definite acute finding by CT. Chronic small-vessel changes remain evident within the pons. No focal cerebellar insult. Old infarctions remain visible within the thalami, more extensive on the right than the left. Widespread chronic small-vessel ischemic changes main evident within the cerebral hemispheric white matter, progressive over time. On today's study, 1 could question a small area of cortical low-density in the right occipital lobe that could represent a small occipital infarction, but this is not definite. No sign of  mass lesion, hemorrhage, hydrocephalus or extra-axial collection. Vascular: There is atherosclerotic calcification of the major vessels at the base of the brain. Skull: Negative Sinuses/Orbits: Clear/normal Other: None IMPRESSION: No definite acute finding. Extensive chronic ischemic changes throughout the brain, progressive over time. 1 could question a small area of cortical low-density in the right occipital lobe that could represent a recent right occipital infarction, but this is not definite. Electronically Signed   By: Nelson Chimes M.D.   On: 09/30/2017 13:48   Dg Chest Port 1 View  Result Date: 10/01/2017 CLINICAL DATA:  Respiratory failure. EXAM: PORTABLE CHEST 1 VIEW COMPARISON:  09/30/2017. FINDINGS: Endotracheal tube and right subclavian central line stable position. Stable cardiomegaly. Mild left base and left upper subsegmental atelectasis. No pleural effusion or pneumothorax. Degenerative changes thoracic spine. IMPRESSION: 1.  Lines and tubes in stable position. 2.  Stable cardiomegaly.  No pulmonary venous congestion. 3. Mild left base and left upper lung subsegmental atelectasis. No change from prior exam. Electronically Signed   By: Marcello Moores  Register   On: 10/01/2017 06:44   Dg Chest Port 1 View  Result Date: 09/30/2017 CLINICAL DATA:  Respiratory failure EXAM: PORTABLE CHEST 1 VIEW COMPARISON:  09/29/2017 FINDINGS: Cardiac shadow is stable. Right subclavian central line is again seen and stable. Endotracheal tube is noted in satisfactory position. Lungs are well aerated bilaterally with improved aeration particularly in the right lung base. Some mild increased density is noted to the right of the mediastinum in the upper lobe likely related to patient rotation to the right. No new focal infiltrate is seen. IMPRESSION: Overall improved aeration. Electronically Signed   By: Inez Catalina M.D.   On: 09/30/2017 06:51    ASSESSMENT AND PLAN:  RichardRinkeris a89 y.o.malewith a known  history per below, history of neurogenic syncope secondary to Parkinson's disease, chronic orthostatic hypotension on Midrin, presented to the hospital with acute urinary obstruction/inability to void, noted recurrent hematuria  * acute Hypoxic respiratory due to acute on chronic systolic CHF with volume overload suspected IVF  For hospice/comfort care going forward  *elevated troponin/Acute ACS /Demand ischemia ginve severe CMP EF 20-25% Cardiology/intensivist notes were reviewed  Plan of care per above   * Acute encephalopathy/?delirium--now intubated and on the vent -multifactorial Plan of care per above  *Acute urinary retention secondary to large bladder clot.  Plan of care per above  *Acute blood loss anemia secondary to hematuria. Plan of care per above  *Acute kidney injury over CKD stage III secondary to post renal obstruction and also ATN.  Plan of care per above   All the records are reviewed and case discussed with Care Management/Social Workerr. Management plans discussed with the patient, family and they are in agreement.  CODE STATUS: dnr  TOTAL TIME TAKING CARE OF THIS PATIENT: 35 minutes.     POSSIBLE D/C IN 1-3 DAYS, DEPENDING ON CLINICAL CONDITION.   Avel Peace Eian Vandervelden M.D on 10/01/2017   Between 7am to 6pm - Pager - [phone]  After 6pm go to [email or web address] - password [email or web address] [facility]  Office  [phone]  CC: Primary care physician; [name], MD  Note: This dictation was prepared with Dragon dictation along with smaller phrase technology. Any transcriptional errors that result from this process are unintentional.

## 2017-10-01 NOTE — Progress Notes (Signed)
PULMONARY / CRITICAL CARE MEDICINE   Name: Keith Woods MRN: [id number] DOB: [birth year]    ADMISSION DATE:  09/25/2017 CONSULTATION DATE:  09/29/17  PT PROFILE: 41 M with history of Parkinson's, ischemic cardiomyopathy (LVEF 20-25% by echocardiogram 05/30/17), chronic orthostatic hypotension adm 02/16 to hospitalist service with acute urinary retention and AKI.  Transferred to ICU 2/19 with progressive alteration in mental status and dyspnea  MAJOR EVENTS/TEST RESULTS: 02/15 admitted to hospitalist service as above 02/16 CTAP: Distended urinary bladder filled with high-density material, likely blood clot in the setting of hematuria. Large bladder mass is not entirely excluded based on imaging findings alone. This could be further assessed with cystoscopy. Mild bilateral hydroureteronephrosis 02/16 Cystourethroscopy with limited transurethral resection of the prostate, clot evacuation, fulguration (Dr Link Snuffer) 02/18 CT head: No acute intracranial abnormality.  Moderate to severe chronic small vessel white matter ischemic changes and remote infarcts 02/19 worsening delirium and respiratory distress.  Transferred to ICU and required intubation shortly after arrival 02/20 Trop I 35.14. BNP < 4500. Minimally responsive on minimal sedation. Reduced LUE movement  02/20 Developed AFRVR - PRN metoprolol. Amiodarone gtt > converted to NSR 02/20 Echocardiogram: LVEF 10-15% 02/20 CT head: No definite acute finding. Extensive chronic ischemic changes throughout the brain, progressive over time. One could question a small area of cortical low-density in the right occipital lobe that could represent a recent right occipital infarction, but this is not definite. 02/21 Transfuse one unit PRBCs for Hgb 7.4 02/21 More responsive and F/C. Goals of care discussed further with plan for one way extubation after transfusion of one unit RBCs  INDWELLING DEVICES:: ETT 02/19 >>  R [place] CVL 02/19 >>    MICRO DATA: MRSA PCR 02/19 >> NEG Urine 02/19 >> NEG Resp 02/19 >> NOF Blood 02/19 >>   PCT 02/19: 1.04, 1.57, 1.49  ANTIMICROBIALS:  Pip-tazo 02/19 >> 02/24 (planned)    SUBJECTIVE:  RASS -4. Not F/C  VITAL SIGNS: BP 115/81   Pulse 86   Temp 98.7 F (37.1 C) (Oral)   Resp 11   Ht 5\' 11"  (1.803 m)   Wt 73.1 kg (161 lb 2.5 oz)   SpO2 99%   BMI 22.48 kg/m   HEMODYNAMICS:    VENTILATOR SETTINGS: Vent Mode: Spontaneous FiO2 (%):  [28 %-50 %] 28 % Set Rate:  [16 bmp] 16 bmp Vt Set:  [550 mL] 550 mL PEEP:  [5 cmH20] 5 cmH20 Pressure Support:  [5 cmH20-8 cmH20] 5 cmH20  INTAKE / OUTPUT: I/O last 3 completed shifts: In: 2058.3 [I.V.:1908.3; IV Piggyback:150] Out: 1125 [Urine:1125]  PHYSICAL EXAMINATION: General: More responsive, + F/C Neuro: CNs intact. Still with decreased movement on L, especially LUE Cardiovascular: RRR, no M Lungs: No wheezes Abdomen: Soft, NT, + BS Extremities: Cool without edema, decreased DP pulses Skin: No lesions noted  LABS:  BMET Recent Labs  Lab 09/29/17 1215 09/30/17 0449 10/01/17 0423  NA 133* 137 140  K 5.2* 4.2 4.0  CL 102 101 100*  CO2 17* 23 27  BUN 78* 79* 87*  CREATININE 2.79* 2.85* 3.06*  GLUCOSE 177* 150* 152*    Electrolytes Recent Labs  Lab 09/28/17 0856 09/29/17 1215 09/30/17 0449 10/01/17 0423  CALCIUM 9.4 9.0 8.6* 8.3*  MG 2.0  --   --  2.3  PHOS  --   --   --  4.4    CBC Recent Labs  Lab 09/29/17 1215 09/30/17 0449 10/01/17 0423  WBC 15.5* 11.5* 10.4  HGB  8.7* 7.2* 7.4*  HCT 25.9* 21.6* 22.3*  PLT 249 224 237    Coag's Recent Labs  Lab 10/01/17 0423  INR 1.36    Sepsis Markers Recent Labs  Lab 09/29/17 1215 09/30/17 0449 10/01/17 0423  PROCALCITON 1.04 1.57 1.49    ABG Recent Labs  Lab 09/29/17 1024 09/29/17 1200  PHART 7.44 7.38  PCO2ART 27* 32  PO2ART 65* 229*    Liver Enzymes Recent Labs  Lab 09/29/17 1215 10/01/17 0423  AST 275* 271*  ALT 11* 68*   ALKPHOS 115 153*  BILITOT 0.9 0.6  ALBUMIN 3.2* 2.6*    Cardiac Enzymes Recent Labs  Lab 09/29/17 2139 09/30/17 0210 10/01/17 0423  TROPONINI 33.67* 35.14* 29.94*    Glucose Recent Labs  Lab 09/29/17 1022 09/29/17 1043 09/29/17 1155  GLUCAP 137* 156* 174*    CXR: Mild cardiomegaly.  No overt edema   ASSESSMENT / PLAN:  PULMONARY A: Acute hypoxemic respiratory failure Respiratory distress Asymmetric pulmonary edema, improved with positive pressure ventilation Possible HCAP, NOS P:   Likely one-way extubation later today Supplemental oxygen as needed to maintain SPO2 >90%  CARDIOVASCULAR A:  Severe ischemic cardiomyopathy (LVEF 10-15%) Chronic orthostatic hypotension Acute MI New onset PAF with RVR - NSR presently P:  Cont telemetry Cont midodrine - home dose is 10 mg TID Cont Low dose IV metoprolol to maintain HR < 115/min Cont amiodarone DNR - see below  RENAL A:   Urinary retention s/p cystourethroscopy with limited TURP AKI/CKD Chronic metabolic acidosis - resolved with HCO3 P:   Monitor BMET intermittently Monitor I/Os Correct electrolytes as indicated  DC HCO3 infusion Not a candidate for HD - see below  GASTROINTESTINAL A:   Difficult OGT placement P:   SUP: IV famotidine Consider SLP eval after extubation if indicated  HEMATOLOGIC A:   Acute blood loss anemia (urinary tract) - not actively bleeding presently P:  DVT px: SQ heparin Monitor CBC intermittently Transfuse per usual guidelines - Hgb goal > 8.0 in setting of ACS  INFECTIOUS A:   Possible PNA, NOS Possible UTI P:   Monitor temp, WBC count Micro and abx as above  Complete 5 days pip-tazo  ENDOCRINE A:   Mild stress induced hyperglycemia without history of DM P:   Monitor glucose on chemistry panels Consider SSI for glucose > 180  NEUROLOGIC A:   Acute encephalopathy, resolved ICU/ventilator associated discomfort P:   RASS goal: 0, -1 DC PAD protocol  after extubation   FAMILY/GOALS OF CARE: 02/19 Wife, daughter and other family members updated in detail.  Goals of care discussed.  Agreed to no escalation (no CPR/ACLS, no HD) and that our current level of care would not be extended beyond a few days unless there was clear evidence of improvement. 02/20 Family updated. Wish to continue current support for now 02/21 Multiple family members updated in light of new echocardiogram findings. We discussed goals of care in detail. I conveyed that, best case scenario, might be able to get home with Hospice follow up. Will plan on one-way extubation today and be prepared to transition to comfort care if he fails. If he tolerates extubation, we should work towards home with Hospice as soon as feasible    CCM time: 45 mins The above time includes time spent in consultation with patient and/or family members and reviewing care plan on multidisciplinary rounds  Merton Border, MD PCCM service Mobile [phone] Pager [phone] 10/01/2017, 11:35 AM

## 2017-10-01 NOTE — Consult Note (Signed)
Pharmacy Antibiotic Note  Lestat Golob is a 84 y.o. male admitted on 09/25/2017.  Pharmacy has been consulted for Zosyn dosing for aspiration PNA.  Plan: Continue Zosyn 3.375 IV EI every 12 hour based on CrCl <73ml/min. Stop date  2/19  Height: 5\' 11"  (180.3 cm) Weight: 161 lb 2.5 oz (73.1 kg) IBW/kg (Calculated) : 75.3  Temp (24hrs), Avg:98 F (36.7 C), Min:97.2 F (36.2 C), Max:98.7 F (37.1 C)  Recent Labs  Lab 09/25/17 2108 09/26/17 0505 09/27/17 0751 09/28/17 0856 09/29/17 1215 09/30/17 0449 10/01/17 0423  WBC 13.6* 12.7*  --   --  15.5* 11.5* 10.4  CREATININE 2.47* 2.62* 3.31* 2.81* 2.79* 2.85* 3.06*    Estimated Creatinine Clearance: 16.6 mL/min (A) (by C-G formula based on SCr of 3.06 mg/dL (H)).    Allergies  Allergen Reactions  . Lipitor [Atorvastatin Calcium]     Leg pain  . Nsaids   . Statins     Leg pain    Antimicrobials this admission: 2/19 Zosyn>>   Dose adjustments this admission:  Microbiology results: 2/19  BCx: pending 2/19  Sputum/Resp: GPC, Gram (-) Coccobacilli  2/19 MRSA PCR: negative   Thank you for allowing pharmacy to be a part of this patient's care.  Pernell Dupre, PharmD, BCPS Clinical Pharmacist 10/01/2017 12:45 PM

## 2017-10-01 NOTE — Progress Notes (Signed)
Patient extubated, not to be reintubated during this shift, family at bedside before and after extubation. 1 unit of blood given pre extubation per Dr. Alva Garnet orders. Patient with Cheyne-Stokes breathing both pre and post extubation, patient resting comfortably post extubation no prn medications given.

## 2017-10-01 NOTE — Progress Notes (Signed)
Provided emotional support to patient's son.  Family pastor also present.  Discussed possible extubation to occur later today. Patient's son appears to be accepting and shows signs of anticipatory grief.

## 2017-10-01 NOTE — Progress Notes (Signed)
Follow up. Pastor by the bedside. He said there will be 24 hour coverage and if he make it he will be put on hospice.    10/01/17 1600  Clinical Encounter Type  Visited With Family  Visit Type Follow-up

## 2017-10-01 NOTE — Progress Notes (Signed)
Patient extubated to 3 L nasal cannula per MD order. Family immediately brought back to bedside. Patient had received 80 mg IV lasix prior to extubation. Per plan of care discussion with Dr. Mortimer Fries, patient only to have nasal cannula oxygen (no escalation to mask including bipap). MD ordered prn morphine and ativan, if patient declines and has difficulty breathing patient to transition to comfort care per MD.

## 2017-10-01 NOTE — Care Management (Addendum)
RNCM left hospice agency list at bedside as family was in a meeting with MD. [facility] will follow. I understand that patient's family has already been reaching out to [facility] for services. Karen with [facility] has been updated.

## 2017-10-01 NOTE — Progress Notes (Signed)
   10/01/17 1400  Clinical Encounter Type  Visited With Patient and family together  Visit Type Follow-up;Critical Care  Referral From Chaplain  Spiritual Encounters  Spiritual Needs Emotional

## 2017-10-02 ENCOUNTER — Telehealth: Payer: Self-pay | Admitting: Family Medicine

## 2017-10-02 LAB — TYPE AND SCREEN
ABO/RH(D): B POS
Antibody Screen: NEGATIVE
UNIT DIVISION: 0

## 2017-10-02 LAB — BASIC METABOLIC PANEL
Anion gap: 14 (ref 5–15)
BUN: 98 mg/dL — AB (ref 6–20)
CHLORIDE: 103 mmol/L (ref 101–111)
CO2: 25 mmol/L (ref 22–32)
Calcium: 7.9 mg/dL — ABNORMAL LOW (ref 8.9–10.3)
Creatinine, Ser: 3.45 mg/dL — ABNORMAL HIGH (ref 0.61–1.24)
GFR calc Af Amer: 17 mL/min — ABNORMAL LOW (ref >60)
GFR, EST NON AFRICAN AMERICAN: 14 mL/min — AB (ref >60)
GLUCOSE: 125 mg/dL — AB (ref 65–99)
Potassium: 3.4 mmol/L — ABNORMAL LOW (ref 3.5–5.1)
Sodium: 142 mmol/L (ref 135–145)

## 2017-10-02 LAB — BPAM RBC
Blood Product Expiration Date: 201903112359
ISSUE DATE / TIME: 201902211058
Unit Type and Rh: 7300

## 2017-10-02 LAB — CBC
HEMATOCRIT: 25.5 % — AB (ref 40.0–52.0)
HEMOGLOBIN: 8.5 g/dL — AB (ref 13.0–18.0)
MCH: 31.3 pg (ref 26.0–34.0)
MCHC: 33.3 g/dL (ref 32.0–36.0)
MCV: 94 fL (ref 80.0–100.0)
Platelets: 238 10*3/uL (ref 150–440)
RBC: 2.72 MIL/uL — ABNORMAL LOW (ref 4.40–5.90)
RDW: 15.8 % — AB (ref 11.5–14.5)
WBC: 11.5 10*3/uL — ABNORMAL HIGH (ref 3.8–10.6)

## 2017-10-02 LAB — GLUCOSE, CAPILLARY
GLUCOSE-CAPILLARY: 124 mg/dL — AB (ref 65–99)
GLUCOSE-CAPILLARY: 144 mg/dL — AB (ref 65–99)

## 2017-10-02 MED ORDER — LORAZEPAM 1 MG PO TABS: 1.0000 mg | ORAL_TABLET | ORAL | 0 refills | Status: AC | PRN

## 2017-10-02 MED ORDER — MORPHINE SULFATE (CONCENTRATE) 10 MG /0.5 ML PO SOLN: ORAL | 0 refills | Status: AC

## 2017-10-02 MED ORDER — MORPHINE SULFATE (PF) 2 MG/ML IV SOLN: 1.0000 mg | Freq: Once | INTRAVENOUS | Status: DC

## 2017-10-02 NOTE — Telephone Encounter (Signed)
Verbal order given.  Thanks,  -Joseline 

## 2017-10-02 NOTE — Telephone Encounter (Signed)
OK for hospice referral 

## 2017-10-02 NOTE — Progress Notes (Signed)
Report given to Colletta Maryland, RN on 1C. Patient transferred to room 108. Family present and updated on plan of care. CCMD notified of transfer. Patient's amiodarone gtt d/c'd per Dr. Jefferson Fuel. Wilnette Kales

## 2017-10-02 NOTE — Progress Notes (Signed)
Nutrition Brief Note  Chart reviewed. Pt now transitioning to comfort care. Plan to d/c home with hospice services today, per MD.  No further nutrition interventions warranted at this time.  Please re-consult as needed.   Broghan Pannone A. Jimmye Norman, RD, LDN, CDE Pager: [phone] After hours Pager: [phone]

## 2017-10-02 NOTE — Telephone Encounter (Signed)
Debra with Hospice called to get an update on the verbal orders she has requested. [name] is requesting a call back before the end of the day if possible. Please advise. Thanks TNP

## 2017-10-02 NOTE — Progress Notes (Signed)
New referral for [facility] services at home received from [facility] and Jeannie Fend. Patient is a 84 year old man with a known history of Parkinson's disease, CAD, CKD, GERD and prostate cancer who came to the [facility] ED from home with complaints of inability to void. CT abdomen noted for distended bladder with clot/mass, elevated white count and kidney functions, urology was consulted. Cystourethroscopy with limited transurethral resection of the prostate,and  clot evacuation by urology on 2/16. He continued to have worsening delirium and respiratory issues, head CT negative for any acute process, , he was transferred to the ICU and required intubation. Patient was extubated on 2/21, with no plan to reintubate or use BIPAP, focus on comfort. family wishes to take patient home with hospice services. Writer met with patient's daughter Lolita Cram and grandson Ysidro Evert to initiate education regarding hospice services, philosophy and team approach to care with understanding voiced. Questions answered. Patient information faxed to referral. DME ordered for delivery ASAP. Ivin Booty to be the contact, numbers and address confirmed.Writer subsequently spoke with patient's other daughter and son to answer questions. Visits made through out the day to provide support and education. Education given regarding use of comfort medications at home. Patient remains minimally responsive, requiring IV morphine for pain/dyspnea. Family understanding that liquid morphine will be given at home. Prescriptions and and singed DNR ion place. Hospital care team updated thought out the day. EMS to be called by staff RN Colletta Maryland when DME is in place. Flo Shanks RN, [facility], hospital liaison [phone]

## 2017-10-02 NOTE — Discharge Summary (Signed)
[facility] NAME: Keith Woods    MR#:  [id number]  DATE OF BIRTH:  [birth year]  DATE OF ADMISSION:  09/25/2017 ADMITTING PHYSICIAN: Keith Harms, MD  DATE OF DISCHARGE: No discharge date for patient encounter.  PRIMARY CARE PHYSICIAN: Keith Sons, MD    ADMISSION DIAGNOSIS:  Gross hematuria [R31.0] Acute urinary retention [R33.8] Syncope, unspecified syncope type [R55]  DISCHARGE DIAGNOSIS:  Active Problems:   Acute urinary obstruction   SECONDARY DIAGNOSIS:   Past Medical History:  Diagnosis Date  . Chronic kidney disease   . Coronary artery disease    NONOBSTRUCTIVE by cath 2008  . GERD (gastroesophageal reflux disease)   . History of prostate cancer   . History of transient ischemic attack (TIA)   . Hyperlipidemia   . Hypertension   . LBBB (left bundle branch block)   . Stroke ([place]) 02/2016   Hemorrhagic  . Syncope and collapse     HOSPITAL COURSE:  [name], history of neurogenic syncope secondary to Parkinson's disease, chronic orthostatic hypotension on Midrin, presented to the hospital with acute urinary obstruction/inability to void, noted recurrent hematuria  * acute Hypoxic respiratory due to acute on chronic systolic CHF with volume overload suspected IVF  For hospice/comfort care going forward per family in home  *elevated troponin/Acute ACS /Demand ischemia ginve severe CMP EF 20-25% Cardiology/intensivist did see patient while in house   Plan of care per above   * Acute encephalopathy/?delirium--now intubated and on the vent -multifactorial Plan of care per above  *Acute urinary retention secondary to large bladder clot.  Plan of care per above  *Acute blood loss anemia secondary to hematuria. Plan of care per above  *Acute kidney injury over CKD stage III secondary to post renal obstruction and also ATN.  Plan  of care per above  DISCHARGE CONDITIONS:  Condition guarded, prognosis terminal, for home with hospice with expectant management, for more specific details please see chart CONSULTS OBTAINED:  Treatment Team:  Lucas Mallow, MD Hollice Espy, MD Wilhelmina Mcardle, MD  DRUG ALLERGIES:   Allergies  Allergen Reactions  . Lipitor [Atorvastatin Calcium]     Leg pain  . Nsaids   . Statins     Leg pain    DISCHARGE MEDICATIONS:   Allergies as of 10/02/2017      Reactions   Lipitor [atorvastatin Calcium]    Leg pain   Nsaids    Statins    Leg pain      Medication List    STOP taking these medications   ALIGN 4 MG Caps   allopurinol 100 MG tablet Commonly known as:  ZYLOPRIM   aspirin 81 MG EC tablet   b complex vitamins tablet   carbidopa-levodopa 25-100 MG tablet Commonly known as:  SINEMET IR   citalopram 10 MG tablet Commonly known as:  CELEXA   Colchicine 0.6 MG Caps Commonly known as:  MITIGARE   midodrine 10 MG tablet Commonly known as:  PROAMATINE   multivitamin tablet   nitroGLYCERIN 0.4 MG SL tablet Commonly known as:  NITROSTAT   sodium bicarbonate 650 MG tablet   Vitamin D (Ergocalciferol) 50000 units Caps capsule Commonly known as:  DRISDOL     TAKE these medications   LORazepam 1 MG tablet Commonly known as:  ATIVAN Take 1 tablet (1 mg total) by mouth every 4 (four) hours as needed for anxiety,  seizure, sedation or sleep.   morphine CONCENTRATE 10 mg / 0.5 ml concentrated solution 0.5 ml po q hour prn pain        DISCHARGE INSTRUCTIONS:   If you experience worsening of your admission symptoms, develop shortness of breath, life threatening emergency, suicidal or homicidal thoughts you must seek medical attention immediately by calling 911 or calling your MD immediately  if symptoms less severe.  You Must read complete instructions/literature along with all the possible adverse reactions/side effects for all the Medicines you  take and that have been prescribed to you. Take any new Medicines after you have completely understood and accept all the possible adverse reactions/side effects.   Please note  You were cared for by a hospitalist during your hospital stay. If you have any questions about your discharge medications or the care you received while you were in the hospital after you are discharged, you can call the unit and asked to speak with the hospitalist on call if the hospitalist that took care of you is not available. Once you are discharged, your primary care physician will handle any further medical issues. Please note that NO REFILLS for any discharge medications will be authorized once you are discharged, as it is imperative that you return to your primary care physician (or establish a relationship with a primary care physician if you do not have one) for your aftercare needs so that they can reassess your need for medications and monitor your lab values.    Today   CHIEF COMPLAINT:   Chief Complaint  Patient presents with  . Near Syncope    HISTORY OF PRESENT ILLNESS:  84 y.o. 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, chronic orthostatic hypotension on Midrin, presented to the hospital with acute urinary obstruction/inability to void, noted recurrent hematuria-first episode was 2 weeks ago which resolved was thought to be due to urinary tract infection, today patient had a syncopal episode while on the toilet, brought to the emergency room via EMS, ER workup noted for creatinine 2.4-slightly up from 2.1 last check, white count 13,000, CT abdomen noted for distended bladder with probable clot/mass cannot be excluded/bilateral mild hydronephrosis, patient evaluated emergency room with family present, patient is now been admitted for acute urinary obstruction secondary to bladder clot/hematuria with bilateral hydronephrosis. VITAL SIGNS:  Blood  pressure (!) 98/59, pulse 75, temperature (!) 97.3 F (36.3 C), temperature source Axillary, resp. rate 16, height 5\' 11"  (1.803 m), weight 73.1 kg (161 lb 2.5 oz), SpO2 92 %.  I/O:    Intake/Output Summary (Last 24 hours) at 10/02/2017 1225 Last data filed at 10/02/2017 1000 Gross per 24 hour  Intake 1013.15 ml  Output 1200 ml  Net -186.85 ml    PHYSICAL EXAMINATION:  GENERAL:  84 y.o.-year-old patient lying in the bed with no acute distress.  EYES: Pupils equal, round, reactive to light and accommodation. No scleral icterus. Extraocular muscles intact.  HEENT: Head atraumatic, normocephalic. Oropharynx and nasopharynx clear.  NECK:  Supple, no jugular venous distention. No thyroid enlargement, no tenderness.  LUNGS: Normal breath sounds bilaterally, no wheezing, rales,rhonchi or crepitation. No use of accessory muscles of respiration.  CARDIOVASCULAR: S1, S2 normal. No murmurs, rubs, or gallops.  ABDOMEN: Soft, non-tender, non-distended. Bowel sounds present. No organomegaly or mass.  EXTREMITIES: No pedal edema, cyanosis, or clubbing.  NEUROLOGIC: Cranial nerves II through XII are intact. Muscle strength 5/5 in all extremities. Sensation intact. Gait not checked.  PSYCHIATRIC: The patient is alert and oriented x 3.  SKIN: No obvious rash, lesion, or ulcer.   DATA REVIEW:   CBC Recent Labs  Lab 10/02/17 0412  WBC 11.5*  HGB 8.5*  HCT 25.5*  PLT 238    Chemistries  Recent Labs  Lab 10/01/17 0423 10/02/17 0412  NA 140 142  K 4.0 3.4*  CL 100* 103  CO2 27 25  GLUCOSE 152* 125*  BUN 87* 98*  CREATININE 3.06* 3.45*  CALCIUM 8.3* 7.9*  MG 2.3  --   AST 271*  --   ALT 68*  --   ALKPHOS 153*  --   BILITOT 0.6  --     Cardiac Enzymes Recent Labs  Lab 10/01/17 0423  TROPONINI 29.94*    Microbiology Results  Results for orders placed or performed during the hospital encounter of 09/25/17  Urine culture     Status: None   Collection Time: 09/29/17  2:44 AM   Result Value Ref Range Status   Specimen Description   Final    URINE, CATHETERIZED Performed at [facility], [address], [place], [place] [address]    Special Requests   Final    NONE Performed at [facility], [address] [place], [place] [address]    Culture   Final    NO GROWTH Performed at [facility], [place] [address] [place], [place] [address]    Report Status 09/30/2017 FINAL  Final  MRSA PCR Screening     Status: None   Collection Time: 09/29/17 11:48 AM  Result Value Ref Range Status   MRSA by PCR NEGATIVE NEGATIVE Final    Comment:        The GeneXpert MRSA Assay (FDA approved for NASAL specimens only), is one component of a comprehensive MRSA colonization surveillance program. It is not intended to diagnose MRSA infection nor to guide or monitor treatment for MRSA infections. Performed at [facility], [address] [place], [place] [address]   Culture, respiratory (NON-Expectorated)     Status: None   Collection Time: 09/29/17 12:03 PM  Result Value Ref Range Status   Specimen Description   Final    TRACHEAL ASPIRATE Performed at [facility], [address] [place], [place] [address]    Special Requests   Final    NONE Performed at [facility], [address] [place], [place] [address]    Gram Stain   Final    RARE WBC PRESENT, PREDOMINANTLY PMN RARE GRAM POSITIVE COCCI IN CLUSTERS RARE GRAM NEGATIVE COCCOBACILLI    Culture   Final    Consistent with normal respiratory flora. Performed at [facility], [place] [address] [place], [place] [address]    Report Status 10/01/2017 FINAL  Final  Culture, blood (Routine X 2) w Reflex to ID Panel     Status: None (Preliminary result)   Collection Time: 09/29/17 12:15 PM  Result Value Ref Range Status   Specimen Description BLOOD BLOOD RIGHT HAND  Final   Special Requests   Final    BOTTLES DRAWN AEROBIC AND ANAEROBIC Blood Culture  adequate volume   Culture   Final    NO GROWTH 3 DAYS Performed at [facility], [address], [place], [place] [address]    Report Status PENDING  Incomplete  Culture, blood (Routine X 2) w Reflex to ID Panel     Status: None (Preliminary result)   Collection Time: 09/29/17  12:32 PM  Result Value Ref Range Status   Specimen Description BLOOD BLOOD LEFT HAND  Final   Special Requests   Final    BOTTLES DRAWN AEROBIC AND ANAEROBIC Blood Culture adequate volume   Culture   Final    NO GROWTH 3 DAYS Performed at [facility], [address] [place], [place] [address]    Report Status PENDING  Incomplete  Urine Culture     Status: None   Collection Time: 09/29/17  4:38 PM  Result Value Ref Range Status   Specimen Description   Final    URINE, RANDOM Performed at [facility], [address] [place], [place] [address]    Special Requests   Final    NONE Performed at [facility], [address], [place], [place] [address]    Culture   Final    NO GROWTH Performed at [facility], [place] [address], [place],  [address]    Report Status 10/01/2017 FINAL  Final    RADIOLOGY:  Ct Head Wo Contrast  Result Date: 09/30/2017 CLINICAL DATA:  Altered mental status. Left-sided weakness. Negative acute study 2 days ago. EXAM: CT HEAD WITHOUT CONTRAST TECHNIQUE: Contiguous axial images were obtained from the base of the skull through the vertex without intravenous contrast. COMPARISON:  09/28/2017.  05/30/2017.  MRI 03/08/2016. FINDINGS: Brain: No definite acute finding by CT. Chronic small-vessel changes remain evident within the pons. No focal cerebellar insult. Old infarctions remain visible within the thalami, more extensive on the right than the left. Widespread chronic small-vessel ischemic changes main evident within the cerebral hemispheric white matter, progressive over time. On today's study, 1 could question a small area of cortical  low-density in the right occipital lobe that could represent a small occipital infarction, but this is not definite. No sign of mass lesion, hemorrhage, hydrocephalus or extra-axial collection. Vascular: There is atherosclerotic calcification of the major vessels at the base of the brain. Skull: Negative Sinuses/Orbits: Clear/normal Other: None IMPRESSION: No definite acute finding. Extensive chronic ischemic changes throughout the brain, progressive over time. 1 could question a small area of cortical low-density in the right occipital lobe that could represent a recent right occipital infarction, but this is not definite. Electronically Signed   By: Nelson Chimes M.D.   On: 09/30/2017 13:48   Dg Chest Port 1 View  Result Date: 10/01/2017 CLINICAL DATA:  Respiratory failure. EXAM: PORTABLE CHEST 1 VIEW COMPARISON:  09/30/2017. FINDINGS: Endotracheal tube and right subclavian central line stable position. Stable cardiomegaly. Mild left base and left upper subsegmental atelectasis. No pleural effusion or pneumothorax. Degenerative changes thoracic spine. IMPRESSION: 1.  Lines and tubes in stable position. 2.  Stable cardiomegaly.  No pulmonary venous congestion. 3. Mild left base and left upper lung subsegmental atelectasis. No change from prior exam. Electronically Signed   By: Marcello Moores  Register   On: 10/01/2017 06:44    EKG:   Orders placed or performed during the hospital encounter of 09/25/17  . ED EKG  . ED EKG  . EKG 12-Lead  . EKG 12-Lead  . EKG 12-Lead  . EKG 12-Lead  . EKG 12-Lead  . EKG 12-Lead      Management plans discussed with the patient, family and they are in agreement.  CODE STATUS:     Code Status Orders  (From admission, onward)        Start     Ordered   09/29/17 1228  Do not attempt  resuscitation (DNR)  Continuous    Question Answer Comment  In the event of cardiac or respiratory ARREST Do not call a "code blue"   In the event of cardiac or respiratory ARREST Do  not perform Intubation, CPR, defibrillation or ACLS   In the event of cardiac or respiratory ARREST Use medication by any route, position, wound care, and other measures to relive pain and suffering. May use oxygen, suction and manual treatment of airway obstruction as needed for comfort.      09/29/17 1227    Code Status History    Date Active Date Inactive Code Status Order ID Comments User Context   09/26/2017 13:14 09/29/2017 12:27 Full Code [id number]  Hillary Bow, MD Inpatient   09/26/2017 13:14 09/26/2017 13:14 DNR [id number]  Hillary Bow, MD Inpatient   09/26/2017 04:20 09/26/2017 13:14 Full Code [id number]  Keith Harms, MD Inpatient   05/29/2017 14:25 05/31/2017 19:57 Full Code [id number]  Demetrios Loll, MD Inpatient   06/24/2016 15:54 06/28/2016 15:41 Full Code [id number]  Nicholes Mango, MD Inpatient   06/24/2016 03:36 06/24/2016 15:54 DNR [id number]  Saundra Shelling, MD Inpatient   03/10/2016 08:37 03/10/2016 14:45 DNR [id number]  [name], MD Inpatient   03/08/2016 16:02 03/08/2016 16:21 Full Code [id number]  Nicholes Mango, MD Inpatient    Advance Directive Documentation     Most Recent Value  Type of Advance Directive  Healthcare Power of Attorney, Living will  Pre-existing out of facility DNR order (yellow form or pink MOST form)  No data  "MOST" Form in Place?  No data      TOTAL TIME TAKING CARE OF THIS PATIENT: 45 minutes.    Avel Peace Raelene Trew M.D on 10/02/2017 at 12:25 PM  Between 7am to 6pm - Pager - [phone]  After 6pm go to [email or web address] - password [email or web address] [facility]  Office  [phone]  CC: Primary care physician; Keith Sons, MD   Note: This dictation was prepared with Dragon dictation along with smaller phrase technology. Any transcriptional errors that result from this process are unintentional.

## 2017-10-02 NOTE — Progress Notes (Signed)
PULMONARY / CRITICAL CARE MEDICINE   Name: Keith Woods MRN: [id number] DOB: 1925/12/29    ADMISSION DATE:  09/25/2017 CONSULTATION DATE:  09/29/17  PT PROFILE: 84 M with history of Parkinson's, ischemic cardiomyopathy (LVEF 20-25% by echocardiogram 05/30/17), chronic orthostatic hypotension adm 02/16 to hospitalist service with acute urinary retention and AKI.  Transferred to ICU 2/19 with progressive alteration in mental status and dyspnea  MAJOR EVENTS/TEST RESULTS: 02/15 admitted to hospitalist service as above 02/16 CTAP: Distended urinary bladder filled with high-density material, likely blood clot in the setting of hematuria. Large bladder mass is not entirely excluded based on imaging findings alone. This could be further assessed with cystoscopy. Mild bilateral hydroureteronephrosis 02/16 Cystourethroscopy with limited transurethral resection of the prostate, clot evacuation, fulguration (Dr Link Snuffer) 02/18 CT head: No acute intracranial abnormality.  Moderate to severe chronic small vessel white matter ischemic changes and remote infarcts 02/19 worsening delirium and respiratory distress.  Transferred to ICU and required intubation shortly after arrival 02/20 Trop I 35.14. BNP < 4500. Minimally responsive on minimal sedation. Reduced LUE movement  02/20 Developed AFRVR - PRN metoprolol. Amiodarone gtt > converted to NSR 02/20 Echocardiogram: LVEF 10-15% 02/20 CT head: No definite acute finding. Extensive chronic ischemic changes throughout the brain, progressive over time. One could question a small area of cortical low-density in the right occipital lobe that could represent a recent right occipital infarction, but this is not definite. 02/21 Transfuse one unit PRBCs for Hgb 7.4 02/21 More responsive and F/C. Goals of care discussed further with plan for one way extubation after transfusion of one unit RBCs  INDWELLING DEVICES:: ETT 02/19 >>  R [place] CVL 02/19 >>    MICRO DATA: MRSA PCR 02/19 >> NEG Urine 02/19 >> NEG Resp 02/19 >> NOF Blood 02/19 >>   PCT 02/19: 1.04, 1.57, 1.49  ANTIMICROBIALS:  Pip-tazo 02/19 >> 02/24 (planned)    SUBJECTIVE:  Patient is extubated, communicating and responsive.  VITAL SIGNS: BP 92/65   Pulse 76   Temp (!) 97.4 F (36.3 C) (Axillary)   Resp 19   Ht 5\' 11"  (1.803 m)   Wt 161 lb 2.5 oz (73.1 kg)   SpO2 94%   BMI 22.48 kg/m   VENTILATOR SETTINGS: Vent Mode: Spontaneous FiO2 (%):  [28 %] 28 % PEEP:  [5 cmH20] 5 cmH20 Pressure Support:  [5 cmH20-8 cmH20] 5 cmH20  INTAKE / OUTPUT: I/O last 3 completed shifts: In: 2504.5 [I.V.:1804.5; Blood:300; IV Piggyback:400] Out: 1480 [Urine:1480]  PHYSICAL EXAMINATION: General: More responsive, + F/C Neuro: CNs intact. Still with decreased movement on L, especially LUE Cardiovascular: RRR, no M Lungs: No wheezes Abdomen: Soft, NT, + BS Extremities: Cool without edema, decreased DP pulses Skin: No lesions noted  LABS:  BMET Recent Labs  Lab 09/30/17 0449 10/01/17 0423 10/02/17 0412  NA 137 140 142  K 4.2 4.0 3.4*  CL 101 100* 103  CO2 23 27 25   BUN 79* 87* 98*  CREATININE 2.85* 3.06* 3.45*  GLUCOSE 150* 152* 125*    Electrolytes Recent Labs  Lab 09/28/17 0856  09/30/17 0449 10/01/17 0423 10/02/17 0412  CALCIUM 9.4   < > 8.6* 8.3* 7.9*  MG 2.0  --   --  2.3  --   PHOS  --   --   --  4.4  --    < > = values in this interval not displayed.    CBC Recent Labs  Lab 09/30/17 0449 10/01/17 0423 10/02/17 2725  WBC 11.5* 10.4 11.5*  HGB 7.2* 7.4* 8.5*  HCT 21.6* 22.3* 25.5*  PLT 224 237 238    Coag's Recent Labs  Lab 10/01/17 0423  INR 1.36    Sepsis Markers Recent Labs  Lab 09/29/17 1215 09/30/17 0449 10/01/17 0423  PROCALCITON 1.04 1.57 1.49    ABG Recent Labs  Lab 09/29/17 1024 09/29/17 1200  PHART 7.44 7.38  PCO2ART 27* 32  PO2ART 65* 229*    Liver Enzymes Recent Labs  Lab 09/29/17 1215  10/01/17 0423  AST 275* 271*  ALT 11* 68*  ALKPHOS 115 153*  BILITOT 0.9 0.6  ALBUMIN 3.2* 2.6*    Cardiac Enzymes Recent Labs  Lab 09/29/17 2139 09/30/17 0210 10/01/17 0423  TROPONINI 33.67* 35.14* 29.94*    Glucose Recent Labs  Lab 09/29/17 1022 09/29/17 1043 09/29/17 1155 10/01/17 1949 10/01/17 2351 10/02/17 0414  GLUCAP 137* 156* 174* 132* 133* 124*    CXR: Mild cardiomegaly.  No overt edema   ASSESSMENT / PLAN:  PULMONARY A: Acute hypoxemic respiratory failure Respiratory distress Asymmetric pulmonary edema, improved with positive pressure ventilation Possible HCAP, NOS P:   Patient extubated and not to be reintubated. Pending palliative care/hospice  CARDIOVASCULAR A:  Severe ischemic cardiomyopathy (LVEF 10-15%) Chronic orthostatic hypotension Acute MI New onset PAF with RVR - NSR presently P:  Cont telemetry Cont midodrine - home dose is 10 mg TID Cont Low dose IV metoprolol to maintain HR < 115/min Cont amiodarone DNR - see below  RENAL A:   Urinary retention s/p cystourethroscopy with limited TURP AKI/CKD Chronic metabolic acidosis - resolved with HCO3 Worsening renal insufficiency with BUN increased 98 and creatinine 3.45. P:   Monitor BMET intermittently Monitor I/Os Correct electrolytes as indicated  DC HCO3 infusion Not a candidate for HD - see below  GASTROINTESTINAL A:   Difficult OGT placement P:   SUP: IV famotidine Consider SLP eval after extubation if indicated  HEMATOLOGIC A:   Acute blood loss anemia (urinary tract) - not actively bleeding presently P:  DVT px: SQ heparin Monitor CBC intermittently Transfuse per usual guidelines - Hgb goal > 8.0 in setting of ACS  INFECTIOUS A:   Possible PNA, NOS Possible UTI P:   Monitor temp, WBC count Micro and abx as above  Complete 5 days pip-tazo  ENDOCRINE A:   Mild stress induced hyperglycemia without history of DM P:   Monitor glucose on chemistry  panels Consider SSI for glucose > 180  NEUROLOGIC A:   Acute encephalopathy, resolved ICU/ventilator associated discomfort P:   RASS goal: 0, -1 DC PAD protocol after extubation  Discuss goals of care with family, patient is a DO NOT RESUSCITATE, will not reintubate if respiratory status worsens, wishes to take patient home, pending palliative/hospice/ case management  Hermelinda Dellen, DO   Patient ID: Keith Woods, male   DOB: 11-15-1925, 84 y.o.   MRN: [id number]

## 2017-10-02 NOTE — Telephone Encounter (Signed)
Keith Woods is being discharged today from [facility] and they have recommended hospice to come in to help w/ patient.  [name] needs an approval for Hospice to do an assessment and follow up.

## 2017-10-02 NOTE — Consult Note (Signed)
Pharmacy Antibiotic Note  Keith Woods is a 83 y.o. male admitted on 09/25/2017.  Pharmacy has been consulted for piperacillin/tazobactam dosing for aspiration PNA.  Plan: Continue piperacillin/tazobactam 3.375 IV EI every 12 hour based on CrCl <38ml/min.  Height: 5\' 11"  (180.3 cm) Weight: 161 lb 2.5 oz (73.1 kg) IBW/kg (Calculated) : 75.3  Temp (24hrs), Avg:97.7 F (36.5 C), Min:97.3 F (36.3 C), Max:98.4 F (36.9 C)  Recent Labs  Lab 09/26/17 0505  09/28/17 0856 09/29/17 1215 09/30/17 0449 10/01/17 0423 10/02/17 0412  WBC 12.7*  --   --  15.5* 11.5* 10.4 11.5*  CREATININE 2.62*   < > 2.81* 2.79* 2.85* 3.06* 3.45*   < > = values in this interval not displayed.    Estimated Creatinine Clearance: 14.7 mL/min (A) (by C-G formula based on SCr of 3.45 mg/dL (H)).    Allergies  Allergen Reactions  . Lipitor [Atorvastatin Calcium]     Leg pain  . Nsaids   . Statins     Leg pain    Antimicrobials this admission: 2/19 Zosyn>>   Dose adjustments this admission:  Microbiology results: 2/19  BCx: No growth 3 days 2/19  Sputum/Resp: GPC, Gram (-) Coccobacilli  2/19 MRSA PCR: negative  2/19 Urine: Negative  Thank you for allowing pharmacy to be a part of this patient's care.  Lenis Noon, PharmD, BCPS Clinical Pharmacist 10/02/2017 1:05 PM

## 2017-10-02 NOTE — Telephone Encounter (Signed)
Please Advise

## 2017-10-02 NOTE — Progress Notes (Signed)
Patient discharged home with daughter, PICC line removed and IV in right forearm. Patient with no complaints. Daughter verbalized understanding of discharge education.

## 2017-10-03 DIAGNOSIS — I447 Left bundle-branch block, unspecified: Secondary | ICD-10-CM | POA: Diagnosis not present

## 2017-10-03 DIAGNOSIS — R339 Retention of urine, unspecified: Secondary | ICD-10-CM | POA: Diagnosis not present

## 2017-10-03 DIAGNOSIS — I130 Hypertensive heart and chronic kidney disease with heart failure and stage 1 through stage 4 chronic kidney disease, or unspecified chronic kidney disease: Secondary | ICD-10-CM | POA: Diagnosis not present

## 2017-10-03 DIAGNOSIS — Z9981 Dependence on supplemental oxygen: Secondary | ICD-10-CM | POA: Diagnosis not present

## 2017-10-03 DIAGNOSIS — E785 Hyperlipidemia, unspecified: Secondary | ICD-10-CM | POA: Diagnosis not present

## 2017-10-03 DIAGNOSIS — I252 Old myocardial infarction: Secondary | ICD-10-CM | POA: Diagnosis not present

## 2017-10-03 DIAGNOSIS — N183 Chronic kidney disease, stage 3 (moderate): Secondary | ICD-10-CM | POA: Diagnosis not present

## 2017-10-03 DIAGNOSIS — I2510 Atherosclerotic heart disease of native coronary artery without angina pectoris: Secondary | ICD-10-CM | POA: Diagnosis not present

## 2017-10-03 DIAGNOSIS — I5023 Acute on chronic systolic (congestive) heart failure: Secondary | ICD-10-CM | POA: Diagnosis not present

## 2017-10-03 DIAGNOSIS — I255 Ischemic cardiomyopathy: Secondary | ICD-10-CM | POA: Diagnosis not present

## 2017-10-03 DIAGNOSIS — K219 Gastro-esophageal reflux disease without esophagitis: Secondary | ICD-10-CM | POA: Diagnosis not present

## 2017-10-03 DIAGNOSIS — G20 Parkinson's disease: Secondary | ICD-10-CM | POA: Diagnosis not present

## 2017-10-03 DIAGNOSIS — I951 Orthostatic hypotension: Secondary | ICD-10-CM | POA: Diagnosis not present

## 2017-10-03 DIAGNOSIS — Z8546 Personal history of malignant neoplasm of prostate: Secondary | ICD-10-CM | POA: Diagnosis not present

## 2017-10-03 DIAGNOSIS — I480 Paroxysmal atrial fibrillation: Secondary | ICD-10-CM | POA: Diagnosis not present

## 2017-10-04 LAB — CULTURE, BLOOD (ROUTINE X 2)
CULTURE: NO GROWTH
CULTURE: NO GROWTH
SPECIAL REQUESTS: ADEQUATE
Special Requests: ADEQUATE

## 2017-10-05 ENCOUNTER — Telehealth: Payer: Self-pay | Admitting: Internal Medicine

## 2017-10-05 ENCOUNTER — Encounter: Payer: Self-pay | Admitting: Family Medicine

## 2017-10-05 NOTE — Anesthesia Postprocedure Evaluation (Signed)
Anesthesia Post Note  Patient: Keith Woods  Procedure(s) Performed: TRANSURETHRAL RESECTION OF BLADDER TUMOR (TURBT) (N/A ) possible CYSTOSCOPY WITH STENT PLACEMENT (Bilateral ) POSSIBLE FULGURATION (N/A ) EVACUATION HEMATOMA (N/A ) RETROGRADE PYELOGRAM TRANSURETHRAL RESECTION OF THE PROSTATE (TURP)  Patient location during evaluation: PACU Anesthesia Type: General Level of consciousness: awake and alert Pain management: pain level controlled Vital Signs Assessment: post-procedure vital signs reviewed and stable Respiratory status: spontaneous breathing, nonlabored ventilation, respiratory function stable and patient connected to nasal cannula oxygen Cardiovascular status: blood pressure returned to baseline and stable Postop Assessment: no apparent nausea or vomiting Anesthetic complications: no     Last Vitals:  Vitals:   10/02/17 0800 10/02/17 1021  BP: 90/66 (!) 98/59  Pulse: 72 75  Resp: 10 16  Temp:  (!) 36.3 C  SpO2: 97% 92%    Last Pain:  Vitals:   10/02/17 1021  TempSrc: Axillary  PainSc: 0-No pain                 Precious Haws Piscitello

## 2017-10-05 NOTE — Telephone Encounter (Signed)
Patient deceased per Daughter on 2022/11/26.  Chart Status change pending obituary search per Debby Freiberg.

## 2017-10-09 DEATH — deceased
